# Patient Record
Sex: Male | Born: 1960 | Race: White | Hispanic: No | Smoking: Never smoker
Health system: Southern US, Community
[De-identification: ages and names within clinical notes are randomized; demographics above are authoritative.]

## PROBLEM LIST (undated history)

## (undated) DIAGNOSIS — E119 Type 2 diabetes mellitus without complications: Secondary | ICD-10-CM

## (undated) DIAGNOSIS — E785 Hyperlipidemia, unspecified: Secondary | ICD-10-CM

## (undated) DIAGNOSIS — Z9889 Other specified postprocedural states: Secondary | ICD-10-CM

## (undated) DIAGNOSIS — I2699 Other pulmonary embolism without acute cor pulmonale: Secondary | ICD-10-CM

## (undated) DIAGNOSIS — I10 Essential (primary) hypertension: Secondary | ICD-10-CM

## (undated) DIAGNOSIS — R112 Nausea with vomiting, unspecified: Secondary | ICD-10-CM

## (undated) HISTORY — DX: Hyperlipidemia, unspecified: E78.5

## (undated) HISTORY — PX: BACK SURGERY: SHX140

## (undated) HISTORY — PX: WISDOM TOOTH EXTRACTION: SHX21

## (undated) HISTORY — PX: KNEE SURGERY: SHX244

## (undated) HISTORY — PX: LUNG SURGERY: SHX703

## (undated) HISTORY — PX: CATARACT EXTRACTION: SUR2

## (undated) HISTORY — PX: ANTERIOR CRUCIATE LIGAMENT REPAIR: SHX115

---

## 2000-07-13 ENCOUNTER — Encounter: Payer: Self-pay | Admitting: Internal Medicine

## 2000-07-13 ENCOUNTER — Emergency Department (HOSPITAL_COMMUNITY): Admission: EM | Admit: 2000-07-13 | Discharge: 2000-07-13 | Payer: Self-pay | Admitting: Emergency Medicine

## 2003-08-29 ENCOUNTER — Encounter: Admission: RE | Admit: 2003-08-29 | Discharge: 2003-08-29 | Payer: Self-pay | Admitting: Specialist

## 2003-10-23 ENCOUNTER — Inpatient Hospital Stay (HOSPITAL_COMMUNITY): Admission: RE | Admit: 2003-10-23 | Discharge: 2003-10-25 | Payer: Self-pay | Admitting: Specialist

## 2012-05-27 ENCOUNTER — Encounter (HOSPITAL_COMMUNITY): Payer: Self-pay | Admitting: *Deleted

## 2012-05-27 ENCOUNTER — Emergency Department (HOSPITAL_COMMUNITY): Payer: BC Managed Care – PPO

## 2012-05-27 ENCOUNTER — Observation Stay (HOSPITAL_COMMUNITY)
Admission: EM | Admit: 2012-05-27 | Discharge: 2012-05-30 | Disposition: A | Discharge status: Home / Self Care | Payer: BC Managed Care – PPO | Attending: Internal Medicine | Admitting: Internal Medicine

## 2012-05-27 DIAGNOSIS — R079 Chest pain, unspecified: Principal | ICD-10-CM | POA: Diagnosis present

## 2012-05-27 DIAGNOSIS — I10 Essential (primary) hypertension: Secondary | ICD-10-CM

## 2012-05-27 DIAGNOSIS — M25519 Pain in unspecified shoulder: Secondary | ICD-10-CM | POA: Insufficient documentation

## 2012-05-27 DIAGNOSIS — M25512 Pain in left shoulder: Secondary | ICD-10-CM

## 2012-05-27 DIAGNOSIS — E7800 Pure hypercholesterolemia, unspecified: Secondary | ICD-10-CM | POA: Diagnosis present

## 2012-05-27 DIAGNOSIS — Z86711 Personal history of pulmonary embolism: Secondary | ICD-10-CM

## 2012-05-27 DIAGNOSIS — R0600 Dyspnea, unspecified: Secondary | ICD-10-CM

## 2012-05-27 DIAGNOSIS — E669 Obesity, unspecified: Secondary | ICD-10-CM | POA: Diagnosis present

## 2012-05-27 HISTORY — DX: Other pulmonary embolism without acute cor pulmonale: I26.99

## 2012-05-27 HISTORY — DX: Essential (primary) hypertension: I10

## 2012-05-27 HISTORY — DX: Pain in left shoulder: M25.512

## 2012-05-27 LAB — BASIC METABOLIC PANEL
BUN: 13 mg/dL (ref 6–23)
CO2: 26 mEq/L (ref 19–32)
Calcium: 10 mg/dL (ref 8.4–10.5)
Chloride: 97 mEq/L (ref 96–112)
Creatinine, Ser: 0.91 mg/dL (ref 0.50–1.35)
GFR calc Af Amer: >90 mL/min (ref >90)
GFR calc non Af Amer: >90 mL/min (ref >90)
Glucose, Bld: 109 mg/dL — ABNORMAL HIGH (ref 70–99)
Potassium: 4.1 mEq/L (ref 3.5–5.1)
Sodium: 135 mEq/L (ref 135–145)

## 2012-05-27 LAB — CBC WITH DIFFERENTIAL/PLATELET
Basophils Absolute: 0 10*3/uL (ref 0.0–0.1)
Basophils Relative: 0 % (ref 0–1)
Eosinophils Absolute: 0.2 10*3/uL (ref 0.0–0.7)
Eosinophils Relative: 2 % (ref 0–5)
HCT: 46.8 % (ref 39.0–52.0)
Hemoglobin: 16 g/dL (ref 13.0–17.0)
Lymphocytes Relative: 20 % (ref 12–46)
Lymphs Abs: 2.4 10*3/uL (ref 0.7–4.0)
MCH: 30.6 pg (ref 26.0–34.0)
MCHC: 34.2 g/dL (ref 30.0–36.0)
MCV: 89.5 fL (ref 78.0–100.0)
Monocytes Absolute: 1.2 10*3/uL — ABNORMAL HIGH (ref 0.1–1.0)
Monocytes Relative: 10 % (ref 3–12)
Neutro Abs: 8.3 10*3/uL — ABNORMAL HIGH (ref 1.7–7.7)
Neutrophils Relative %: 68 % (ref 43–77)
Platelets: 288 10*3/uL (ref 150–400)
RBC: 5.23 MIL/uL (ref 4.22–5.81)
RDW: 13.3 % (ref 11.5–15.5)
WBC: 12.1 10*3/uL — ABNORMAL HIGH (ref 4.0–10.5)

## 2012-05-27 LAB — TROPONIN I: Troponin I: <0.3 ng/mL (ref <0.30)

## 2012-05-27 LAB — D-DIMER, QUANTITATIVE (NOT AT ARMC): D-Dimer, Quant: 0.36 ug/mL-FEU (ref 0.00–0.48)

## 2012-05-27 MED ORDER — SODIUM CHLORIDE 0.9 % IJ SOLN
3.0000 mL | INTRAMUSCULAR | Status: DC | PRN
  Administered 2012-05-28: 3 mL via INTRAVENOUS

## 2012-05-27 MED ORDER — MORPHINE SULFATE 4 MG/ML IJ SOLN
6.0000 mg | Freq: Once | INTRAMUSCULAR | Status: AC
  Administered 2012-05-27: 6 mg via INTRAVENOUS
  Filled 2012-05-27 (×2): qty 1

## 2012-05-27 MED ORDER — BENAZEPRIL HCL 10 MG PO TABS
20.0000 mg | ORAL_TABLET | Freq: Every day | ORAL | Status: DC
  Administered 2012-05-28 – 2012-05-30 (×3): 20 mg via ORAL
  Filled 2012-05-27 (×3): qty 2

## 2012-05-27 MED ORDER — ASPIRIN 81 MG PO CHEW
CHEWABLE_TABLET | ORAL | Status: AC
  Administered 2012-05-27: 324 mg via ORAL
  Filled 2012-05-27: qty 4

## 2012-05-27 MED ORDER — SODIUM CHLORIDE 0.9 % IJ SOLN
3.0000 mL | Freq: Two times a day (BID) | INTRAMUSCULAR | Status: DC
  Administered 2012-05-29: 3 mL via INTRAVENOUS
  Filled 2012-05-27: qty 3

## 2012-05-27 MED ORDER — ACETAMINOPHEN 650 MG RE SUPP: 650.0000 mg | Freq: Four times a day (QID) | RECTAL | Status: DC | PRN

## 2012-05-27 MED ORDER — SODIUM CHLORIDE 0.9 % IV SOLN: 250.0000 mL | INTRAVENOUS | Status: DC | PRN

## 2012-05-27 MED ORDER — MORPHINE SULFATE 2 MG/ML IJ SOLN
2.0000 mg | INTRAMUSCULAR | Status: DC | PRN
  Administered 2012-05-28 – 2012-05-29 (×2): 2 mg via INTRAVENOUS
  Filled 2012-05-27 (×2): qty 1

## 2012-05-27 MED ORDER — ASPIRIN 81 MG PO CHEW: 324.0000 mg | CHEWABLE_TABLET | Freq: Once | ORAL | Status: AC

## 2012-05-27 MED ORDER — ALBUTEROL SULFATE (5 MG/ML) 0.5% IN NEBU: 2.5000 mg | INHALATION_SOLUTION | RESPIRATORY_TRACT | Status: DC | PRN

## 2012-05-27 MED ORDER — ASPIRIN 81 MG PO CHEW: 243.0000 mg | CHEWABLE_TABLET | Freq: Once | ORAL | Status: DC

## 2012-05-27 MED ORDER — ONDANSETRON HCL 4 MG/2ML IJ SOLN: 4.0000 mg | Freq: Four times a day (QID) | INTRAMUSCULAR | Status: DC | PRN

## 2012-05-27 MED ORDER — ACETAMINOPHEN 325 MG PO TABS
650.0000 mg | ORAL_TABLET | Freq: Four times a day (QID) | ORAL | Status: DC | PRN
  Administered 2012-05-29: 650 mg via ORAL
  Filled 2012-05-27: qty 2

## 2012-05-27 MED ORDER — ENOXAPARIN SODIUM 40 MG/0.4ML ~~LOC~~ SOLN
40.0000 mg | SUBCUTANEOUS | Status: DC
  Administered 2012-05-27 – 2012-05-29 (×3): 40 mg via SUBCUTANEOUS
  Filled 2012-05-27 (×3): qty 0.4

## 2012-05-27 MED ORDER — AMLODIPINE BESYLATE 5 MG PO TABS
10.0000 mg | ORAL_TABLET | Freq: Every day | ORAL | Status: DC
  Administered 2012-05-28 – 2012-05-29 (×2): 10 mg via ORAL
  Filled 2012-05-27 (×2): qty 2

## 2012-05-27 MED ORDER — SODIUM CHLORIDE 0.9 % IJ SOLN
3.0000 mL | Freq: Two times a day (BID) | INTRAMUSCULAR | Status: DC
  Administered 2012-05-27 – 2012-05-29 (×4): 3 mL via INTRAVENOUS

## 2012-05-27 MED ORDER — NITROGLYCERIN 2 % TD OINT: 1.0000 [in_us] | TOPICAL_OINTMENT | Freq: Once | TRANSDERMAL | Status: AC

## 2012-05-27 MED ORDER — ASPIRIN EC 81 MG PO TBEC
81.0000 mg | DELAYED_RELEASE_TABLET | Freq: Every day | ORAL | Status: DC
  Administered 2012-05-28 – 2012-05-30 (×3): 81 mg via ORAL
  Filled 2012-05-27 (×3): qty 1

## 2012-05-27 MED ORDER — DILTIAZEM HCL ER COATED BEADS 240 MG PO CP24
240.0000 mg | ORAL_CAPSULE | Freq: Every day | ORAL | Status: DC
  Administered 2012-05-28 – 2012-05-29 (×2): 240 mg via ORAL
  Filled 2012-05-27 (×2): qty 1

## 2012-05-27 MED ORDER — AMLODIPINE BESY-BENAZEPRIL HCL 10-20 MG PO CAPS: 1.0000 | ORAL_CAPSULE | Freq: Every day | ORAL | Status: DC

## 2012-05-27 MED ORDER — ONDANSETRON HCL 4 MG PO TABS
4.0000 mg | ORAL_TABLET | Freq: Four times a day (QID) | ORAL | Status: DC | PRN
  Administered 2012-05-29 (×2): 4 mg via ORAL
  Filled 2012-05-27 (×2): qty 1

## 2012-05-27 MED ORDER — NITROGLYCERIN 2 % TD OINT
TOPICAL_OINTMENT | TRANSDERMAL | Status: AC
  Administered 2012-05-27: 1 [in_us] via TOPICAL
  Filled 2012-05-27: qty 1

## 2012-05-27 MED ORDER — ATORVASTATIN CALCIUM 20 MG PO TABS
20.0000 mg | ORAL_TABLET | Freq: Every day | ORAL | Status: DC
  Administered 2012-05-28 – 2012-05-30 (×3): 20 mg via ORAL
  Filled 2012-05-27 (×4): qty 1

## 2012-05-27 NOTE — H&P (Signed)
[facility] History and Physical  Brian Roach:[id number] DOB: 1960-06-25 DOA: 05/27/2012   PCP: Provider at [facility] Specialists: None  Chief Complaint: Left-sided shoulder pain and chest pain since about 1 PM  HPI: Brian Roach is a 52 y.o. male who has a past medical history of hypertension, hypercholesterolemia, obesity who was in his usual state of health till about 1:00 this afternoon when he returned home after meeting his daughter. About 20 minutes after he got home he started having pain in the left shoulder area, radiating to the arm to the neck and head. Was described as a sharp pain, which was 10 out of 10 in intensity. He also noticed some tightness in the left side of his chest. Denies any physical exertion in the last day or 2. Denies lifting any heavy weights. Movement did not change the pain. He had minimal shortness of breath and noted that whenever he would take a deep breath the tightness in the left chest increased. He felt a little nauseous, but did not have any vomiting. Felt a little hot and sweaty. Denies any dizziness. Denies palpitations. No leg swelling. He's never had similar symptoms in the past. Denies any history of heart disease. He tells me that he had a pulmonary embolism about 10 years ago, after having knee surgery. He required surgical intervention, and chest tubes for that issue at that time. He's had stress test many years ago. Does not recall when it was. He has never had a cardiac catheterization. In the emergency department he received 4 aspirins and nitroglycerin paste. Pain recurred about half an hour prior to my assessment, and he was given morphine, with which the pain is now down to 4/10. He is anxious.  Home Medications: Prior to Admission medications   Medication Sig Start Date End Date Taking? Authorizing Provider  amLODipine-benazepril (LOTREL) 10-20 MG per capsule Take 1 capsule by mouth daily.   Yes  Historical Provider, MD  atorvastatin (LIPITOR) 20 MG tablet Take 20 mg by mouth daily.   Yes Historical Provider, MD  diltiazem (TIAZAC) 240 MG 24 hr capsule Take 240 mg by mouth daily.   Yes Historical Provider, MD  meloxicam (MOBIC) 15 MG tablet Take 15 mg by mouth daily.   Yes Historical Provider, MD    Allergies: No Known Allergies  Past Medical History: Past Medical History  Diagnosis Date  . PE (pulmonary embolism)   . Hypertension     Past Surgical History  Procedure Laterality Date  . Back surgery    . Knee surgery    . Lung surgery      Unspecified but was done for PE. Had chest tubes. Approx 2003    Social History:  reports that he has never smoked. He does not have any smokeless tobacco history on file. He reports that he does not drink alcohol or use illicit drugs.  Living Situation: He lives with his family in [place] Activity Level: Usually independent with daily activities   Family History:  Family History  Problem Relation Age of Onset  . Heart disease Father    he reports history of heart disease in his brother  Review of Systems - History obtained from the patient General ROS: negative Psychological ROS: positive for - anxiety Ophthalmic ROS: negative ENT ROS: negative Allergy and Immunology ROS: negative Hematological and Lymphatic ROS: negative Endocrine ROS: negative Respiratory ROS: as in hpi Cardiovascular ROS: as in hpi Gastrointestinal ROS: no abdominal pain, change in  bowel habits, or black or bloody stools Genito-Urinary ROS: no dysuria, trouble voiding, or hematuria Musculoskeletal ROS: as in hpi Neurological ROS: no TIA or stroke symptoms Dermatological ROS: negative  Physical Examination  Filed Vitals:   05/27/12 1546 05/27/12 1742  BP: 134/66 141/84  Pulse: 116 89  Temp: 98.7 F (37.1 C)   Resp: 17 24  Height: 5\' 10"  (1.778 m)   Weight: 127.007 kg (280 lb)   SpO2: 99% 97%    General appearance: alert, cooperative, appears  stated age, no distress and moderately obese Head: Normocephalic, without obvious abnormality, atraumatic Eyes: conjunctivae/corneas clear. PERRL, EOM's intact.  Throat: lips, mucosa, and tongue normal; teeth and gums normal Neck: no adenopathy, no carotid bruit, no JVD, supple, symmetrical, trachea midline and thyroid not enlarged, symmetric, no tenderness/mass/nodules Back: symmetric, no curvature. ROM normal. No CVA tenderness. Resp: clear to auscultation bilaterally Chest wall: no tenderness Cardio: regular rate and rhythm, S1, S2 normal, no murmur, click, rub or gallop GI: soft, non-tender; bowel sounds normal; no masses,  no organomegaly Extremities: And off. The left arm and the shoulder did not produce the pain. There was no limitation of movement. Pulses: 2+ and symmetric Skin: Skin color, texture, turgor normal. No rashes or lesions Lymph nodes: Cervical, supraclavicular, and axillary nodes normal. Neurologic: He is alert and oriented x3. No focal neurological deficits are present.  Laboratory Data: Results for orders placed during the hospital encounter of 05/27/12 (from the past 48 hour(s))  TROPONIN I     Status: None   Collection Time    05/27/12  4:18 PM      Result Value Range   Troponin I <0.30  <0.30 ng/mL  CBC WITH DIFFERENTIAL     Status: Abnormal   Collection Time    05/27/12  4:18 PM      Result Value Range   WBC 12.1 (*) 4.0 - 10.5 K/uL   RBC 5.23  4.22 - 5.81 MIL/uL   Hemoglobin 16.0  13.0 - 17.0 g/dL   HCT 16.1  09.6 - 04.5 %   MCV 89.5  78.0 - 100.0 fL   MCH 30.6  26.0 - 34.0 pg   MCHC 34.2  30.0 - 36.0 g/dL   RDW 40.9  81.1 - 91.4 %   Platelets 288  150 - 400 K/uL   Neutrophils Relative 68  43 - 77 %   Lymphocytes Relative 20  12 - 46 %   Monocytes Relative 10  3 - 12 %   Eosinophils Relative 2  0 - 5 %   Basophils Relative 0  0 - 1 %   Neutro Abs 8.3 (*) 1.7 - 7.7 K/uL   Lymphs Abs 2.4  0.7 - 4.0 K/uL   Monocytes Absolute 1.2 (*) 0.1 - 1.0 K/uL    Eosinophils Absolute 0.2  0.0 - 0.7 K/uL   Basophils Absolute 0.0  0.0 - 0.1 K/uL   WBC Morphology WHITE COUNT CONFIRMED ON SMEAR    BASIC METABOLIC PANEL     Status: Abnormal   Collection Time    05/27/12  4:18 PM      Result Value Range   Sodium 135  135 - 145 mEq/L   Potassium 4.1  3.5 - 5.1 mEq/L   Chloride 97  96 - 112 mEq/L   CO2 26  19 - 32 mEq/L   Glucose, Bld 109 (*) 70 - 99 mg/dL   BUN 13  6 - 23 mg/dL   Creatinine, Ser 7.82  0.50 - 1.35 mg/dL   Calcium 47.8  8.4 - 29.5 mg/dL   GFR calc non Af Amer >90  >90 mL/min   GFR calc Af Amer >90  >90 mL/min   Comment:            The eGFR has been calculated     using the CKD EPI equation.     This calculation has not been     validated in all clinical     situations.     eGFR's persistently     <90 mL/min signify     possible Chronic Kidney Disease.  D-DIMER, QUANTITATIVE     Status: None   Collection Time    05/27/12  4:45 PM      Result Value Range   D-Dimer, Quant 0.36  0.00 - 0.48 ug/mL-FEU   Comment:            AT THE INHOUSE ESTABLISHED CUTOFF     VALUE OF 0.48 ug/mL FEU,     THIS ASSAY HAS BEEN DOCUMENTED     IN THE LITERATURE TO HAVE     A SENSITIVITY AND NEGATIVE     PREDICTIVE VALUE OF AT LEAST     98 TO 99%.  THE TEST RESULT     SHOULD BE CORRELATED WITH     AN ASSESSMENT OF THE CLINICAL     PROBABILITY OF DVT / VTE.    Radiology Reports: Dg Chest 2 View  05/27/2012  *RADIOLOGY REPORT*  Clinical Data: Shoulder pain and neck pain.  CHEST - 2 VIEW  Comparison: 10/16/2003  Findings: Two views of the chest demonstrate stable elevation of the right hemidiaphragm and chronic densities along the right lateral ribs.  Otherwise, the lungs are clear.  Heart and mediastinum are stable.  Trachea is midline.  No evidence for pleural effusions.  IMPRESSION: No acute chest findings.   Original Report Authenticated By: Richarda Overlie, M.D.     Electrocardiogram: EKG done at 3:56 PM shows sinus rhythm at 93 beats per minute.  Normal axis. Intervals are normal. No Q waves. There were some suspicious ST changes (minimal ST elevation) noted in V1 and V2. No reciprocal changes. EKG was repeated at 5:40 PM and continue to show similar changes. Both of these EKGs were compared to one from 2005, and the changes seen to be present at that time as well.  Problem List  Principal Problem:   Chest pain at rest Active Problems:   Left shoulder pain   History of pulmonary embolism   Obesity   HTN (hypertension), benign   Hypercholesteremia   Assessment: This is a 52 year old, Caucasian male, who presents with the left shoulder, arm, neck, and chest pain. Symptoms are atypical for heart disease. However, he does have nonspecific EKG changes and plus he has multiple risk factors for having coronary artery disease. Multiple EKGs show similar findings without dynamic changes which is reassuring. Differential diagnoses include coronary artery disease, venous thrombo embolism, musculoskeletal etiology, GI, etiology.  Plan: #1 left shoulder pain, and chest pain: Examination does not suggest musculoskeletal etiology. D-dimer is normal. We will repeat another troponin and if that is normal he will be admitted to this hospital and serial cardiac enzymes will be cycled. Aspirin will be continued. Telemetry shows PACs. TSH level be checked. Echocardiogram will be ordered. He will require a definitive evaluation for heart disease in the form of either stress test or cardiac catheterization. Cardiology can be consulted either as inpatient or outpatient  depending on his symptoms and his troponin levels.  #2 history of hypertension: Continue with his antihypertensive regimen.  #3 history of hypercholesterolemia: Continue with his medications. Lipid panel will be checked.  #4 obesity: He will require weight loss counseling at some point.  #5 history of PE approximately 10 years ago: This was following a knee surgery. With a normal d-dimer this  is very less likely at this time.   DVT Prophylaxis: Enoxaparin  Code Status: Full code  Family Communication: Discussed with the patient and his brother, who was at the, bedside   Disposition Plan: Will likely return home when improved    Further management decisions will depend on results of further testing and patient's response to treatment.  [facility]  [facility] Pager [phone]  If 7PM-7AM, please contact night-coverage [email or web address] Password [facility]  05/27/2012, 7:52 PM

## 2012-05-27 NOTE — ED Notes (Addendum)
Dr. Kerry Hough called and reported that he has discussed pt case with Dr. Juleen China. Dr. Kerry Hough gave verbal order to hold pt in ED until Dr. Earney Mallet can assess pt. Pt/Pt family aware.

## 2012-05-27 NOTE — ED Notes (Signed)
Was informed dr Rito Ehrlich must assess pt in ED prior to pt being admitted upstairs. Will call report after pt has been assessed by hospitalist and is cleared for admission upstairs.

## 2012-05-27 NOTE — ED Notes (Signed)
Pt states pain to left shoulder and pain to jaw, states pain has crept back after being given nitro paste but that after the morphine pain is now a 4/10. Pain also radiated to left jaw and around back of left side of neck.

## 2012-05-27 NOTE — ED Provider Notes (Addendum)
History  This chart was scribed for Brian Razor, MD by Shari Heritage, ED Scribe. The patient was seen in room APA04/APA04. Patient's care was started at 1554.   CSN: [id number]  Arrival date & time 05/27/12  1511   First MD Initiated Contact with Patient 05/27/12 1554      Chief Complaint  Patient presents with  . Shoulder Pain    Patient is a 52 y.o. male presenting with shoulder pain. The history is provided by the patient. No language interpreter was used.  Shoulder Pain This is a new problem. The current episode started 3 to 5 hours ago. The problem occurs constantly. The problem has not changed since onset.Associated symptoms include shortness of breath. Nothing aggravates the symptoms. Nothing relieves the symptoms. He has tried nothing for the symptoms.     HPI Comments: Brian Roach is a 52 y.o. male who presents to the Emergency Department complaining of sudden, moderate, left shoulder pain that radiates up left neck to jaw onset 2-3 hours ago. Patient states that he was not doing any exertional activities at time of onset. Pain is unchanged with movement of the left arm. He states that he went home after pain began and started to have dyspnea on exertion, skin flushing, diaphoresis and nausea. He also says that he felt "tight" in his left side with deep breaths. He states that pain is still present now, but nausea has improved. He states that he was mildly short of breath as he walked into the ED, but he is not experiencing shortness of breath right now. He denies back pain or leg swelling. Patient has a medical history of PE, hypertension and cholesterol. He takes aspirin 81 mg daily. He says that he took aspirin, BP and cholesterol medicines this morning.   PCP - Bennie Pierini, [facility]   Past Medical History  Diagnosis Date  . PE (pulmonary embolism)   . Hypertension     Past Surgical History  Procedure Laterality Date  . Back  surgery    . Knee surgery      No family history on file.  History  Substance Use Topics  . Smoking status: Never Smoker   . Smokeless tobacco: Not on file  . Alcohol Use: No      Review of Systems  Constitutional: Positive for diaphoresis.  Respiratory: Positive for shortness of breath.   Cardiovascular: Negative for leg swelling.  Gastrointestinal: Positive for nausea.  Musculoskeletal: Negative for back pain.  All other systems reviewed and are negative.    Allergies  Review of patient's allergies indicates no known allergies.  Home Medications  No current outpatient prescriptions on file.  Triage Vitals: BP 134/66  Pulse 116  Temp(Src) 98.7 F (37.1 C)  Resp 17  Ht 5\' 10"  (1.778 m)  Wt 280 lb (127.007 kg)  BMI 40.18 kg/m2  SpO2 99%  Physical Exam  Nursing note and vitals reviewed. Constitutional: He appears well-developed and well-nourished. No distress.  Obese.  HENT:  Head: Normocephalic and atraumatic.  Eyes: Conjunctivae are normal. Right eye exhibits no discharge. Left eye exhibits no discharge.  Neck: Neck supple.  Cardiovascular: Regular rhythm and normal heart sounds.  Tachycardia present.  Exam reveals no gallop and no friction rub.   No murmur heard. Mildly tachycardic.  Pulmonary/Chest: Effort normal and breath sounds normal. No respiratory distress.  Abdominal: Soft. He exhibits no distension. There is no tenderness.  Musculoskeletal: He exhibits no edema and no tenderness.  No lower extremity edema. No calf tenderness.  Neurological: He is alert.  Skin: Skin is warm and dry.  Psychiatric: He has a normal mood and affect. His behavior is normal. Thought content normal.    ED Course  Procedures (including critical care time) DIAGNOSTIC STUDIES: Oxygen Saturation is 99% on room air, normal by my interpretation.    COORDINATION OF CARE: 4:09 PM- Patient informed of current plan for treatment and evaluation and agrees with plan at this  time.   EKG:  Rhythm: normal sinus with PACs Rate: 93 Intervals: normal  Axis: normal ST segments: Mild STE in v1/2. No reciprocal changes.  5:48 PM: Patient denies significant improvement of pain after aspirin 324 mg and nitroglycerin. Patient's symptoms are concerning and am recommending hospitalization. Patient agrees with plan at this time. Will page hospitalist to arrange admission.   6:26 PM- Spoke with Dr. Karilyn Cota who has agreed to admit patient for further evaluation.    Labs Reviewed  CBC WITH DIFFERENTIAL - Abnormal; Notable for the following:    WBC 12.1 (*)    Neutro Abs 8.3 (*)    Monocytes Absolute 1.2 (*)    All other components within normal limits  BASIC METABOLIC PANEL - Abnormal; Notable for the following:    Glucose, Bld 109 (*)    All other components within normal limits  TROPONIN I  D-DIMER, QUANTITATIVE    Dg Chest 2 View  05/27/2012  *RADIOLOGY REPORT*  Clinical Data: Shoulder pain and neck pain.  CHEST - 2 VIEW  Comparison: 10/16/2003  Findings: Two views of the chest demonstrate stable elevation of the right hemidiaphragm and chronic densities along the right lateral ribs.  Otherwise, the lungs are clear.  Heart and mediastinum are stable.  Trachea is midline.  No evidence for pleural effusions.  IMPRESSION: No acute chest findings.   Original Report Authenticated By: Richarda Overlie, M.D.    EKG:  Rhythm: normal sinus with PACs Vent. rate 93 BPM PR interval 174 ms QRS duration 86 ms QT/QTc 338/420 ms ST segments: mild STE in v1/2 Comparison: little interval change from ekg form 2005   1. Shoulder pain, acute, left   2. Dyspnea   3. Chest pain at rest   4. HTN (hypertension), benign   5. Hypercholesteremia       MDM  51yM with L shoulder pain. EKG with non-diagnostic STE in v1/v2 w/o reciprocal changes. Noted to lesser degree on EKG from 09/2003. Story concerning. No known CAD but risk factors.      I personally preformed the services  scribed in my presence. The recorded information has been reviewed is accurate. Brian Razor, MD.    Brian Razor, MD 05/28/12 1506  Brian Razor, MD 05/28/12 [phone]

## 2012-05-27 NOTE — ED Notes (Signed)
Pt c/o left shoulder pain that has remained constant that started today, pain radiates to neck, jaw, chest area. Unsure of any associated symptoms, pt states that he feels "tight" on left side and had one episode of nausea prior to leaving his house.

## 2012-05-27 NOTE — ED Notes (Signed)
Dr Rito Ehrlich in to see pt, states after second troponin is neg pt may go up.

## 2012-05-28 DIAGNOSIS — R0989 Other specified symptoms and signs involving the circulatory and respiratory systems: Secondary | ICD-10-CM

## 2012-05-28 LAB — COMPREHENSIVE METABOLIC PANEL
Alkaline Phosphatase: 80 U/L (ref 39–117)
BUN: 13 mg/dL (ref 6–23)
Chloride: 99 mEq/L (ref 96–112)
Creatinine, Ser: 0.88 mg/dL (ref 0.50–1.35)
GFR calc Af Amer: >90 mL/min (ref >90)
GFR calc non Af Amer: >90 mL/min (ref >90)
Glucose, Bld: 107 mg/dL — ABNORMAL HIGH (ref 70–99)
Potassium: 3.7 mEq/L (ref 3.5–5.1)
Total Bilirubin: 0.7 mg/dL (ref 0.3–1.2)

## 2012-05-28 LAB — CBC
Platelets: 275 10*3/uL (ref 150–400)
RBC: 4.99 MIL/uL (ref 4.22–5.81)
RDW: 13.4 % (ref 11.5–15.5)
WBC: 9.7 10*3/uL (ref 4.0–10.5)

## 2012-05-28 LAB — TROPONIN I: Troponin I: <0.3 ng/mL (ref <0.30)

## 2012-05-28 LAB — PROTIME-INR
INR: 1.09 (ref 0.00–1.49)
Prothrombin Time: 14 seconds (ref 11.6–15.2)

## 2012-05-28 MED ORDER — BIOTENE DRY MOUTH MT LIQD
15.0000 mL | Freq: Two times a day (BID) | OROMUCOSAL | Status: DC
  Administered 2012-05-28 – 2012-05-30 (×4): 15 mL via OROMUCOSAL

## 2012-05-28 NOTE — Progress Notes (Signed)
05/28/12 1823 Patient ambulated hallway this evening independently, tolerated well. No c/o pain or shortness of breath during ambulation. Earnstine Regal, RN

## 2012-05-28 NOTE — Progress Notes (Signed)
[facility] PROGRESS NOTE  Brian Roach ZOX:[id number] DOB: 07/31/1960 DOA: 05/27/2012 PCP: Bennie Pierini, FNP  Assessment/Plan: 1. Left shoulder pain/chest discomfort. Patient is ruled out for ACS with negative cardiac markers. He did have some questionable changes on his initial EKG. He continues to have intermittent symptoms. 2-D echocardiogram will be done tomorrow. We will request a cardiology consultation regarding any further workup if necessary. Continue aspirin. 2. Hypertension. Controlled. 3. Hypercholesterolemia. Continue Lipitor. 4. History of PE approximately 10 years ago. D-dimer is normal, unlikely this is PE. 5. Obesity  Code Status: full code Family Communication: discussed with patient Disposition Plan: discharge home when improved   Consultants:  none  Procedures:  none  Antibiotics:  none  HPI/Subjective: Recurrent left shoulder pain radiating to jaw with associated chest tightness overnight. Patient feels somewhat short of breath on ambulation. This is a new symptom.  Objective: Filed Vitals:   05/27/12 2211 05/28/12 0545 05/28/12 0723 05/28/12 1343  BP:  134/83  104/58  Pulse: 78 82  83  Temp:  98.2 F (36.8 C)  98.5 F (36.9 C)  TempSrc:      Resp: 16 20  18   Height:      Weight:      SpO2: 91% 98% 98% 99%    Intake/Output Summary (Last 24 hours) at 05/28/12 1521 Last data filed at 05/28/12 1200  Gross per 24 hour  Intake    723 ml  Output      0 ml  Net    723 ml   Filed Weights   05/27/12 1546 05/27/12 2059  Weight: 127.007 kg (280 lb) 132.949 kg (293 lb 1.6 oz)    Exam:   General:  No acute distress  Cardiovascular: S1, S2, regular rate and rhythm  Respiratory: Clear to auscultation bilaterally  Abdomen: Soft, nontender, nondistended, bowel sounds are active  Musculoskeletal: No pain on palpation the left shoulder, full range of movement without any discomfort.   Data Reviewed: Basic Metabolic  Panel:  Recent Labs Lab 05/27/12 1618 05/28/12 0423  NA 135 136  K 4.1 3.7  CL 97 99  CO2 26 27  GLUCOSE 109* 107*  BUN 13 13  CREATININE 0.91 0.88  CALCIUM 10.0 9.0   Liver Function Tests:  Recent Labs Lab 05/28/12 0423  AST 26  ALT 44  ALKPHOS 80  BILITOT 0.7  PROT 7.0  ALBUMIN 4.0   No results found for this basename: LIPASE, AMYLASE,  in the last 168 hours No results found for this basename: AMMONIA,  in the last 168 hours CBC:  Recent Labs Lab 05/27/12 1618 05/28/12 0423  WBC 12.1* 9.7  NEUTROABS 8.3*  --   HGB 16.0 15.4  HCT 46.8 44.6  MCV 89.5 89.4  PLT 288 275   Cardiac Enzymes:  Recent Labs Lab 05/27/12 1618 05/27/12 1934 05/28/12 0423 05/28/12 1023  TROPONINI <0.30 <0.30 <0.30 <0.30   BNP (last 3 results) No results found for this basename: PROBNP,  in the last 8760 hours CBG: No results found for this basename: GLUCAP,  in the last 168 hours  No results found for this or any previous visit (from the past 240 hour(s)).   Studies: Dg Chest 2 View  05/27/2012  *RADIOLOGY REPORT*  Clinical Data: Shoulder pain and neck pain.  CHEST - 2 VIEW  Comparison: 10/16/2003  Findings: Two views of the chest demonstrate stable elevation of the right hemidiaphragm and chronic densities along the right lateral ribs.  Otherwise, the lungs  are clear.  Heart and mediastinum are stable.  Trachea is midline.  No evidence for pleural effusions.  IMPRESSION: No acute chest findings.   Original Report Authenticated By: Richarda Overlie, M.D.     Scheduled Meds: . benazepril  20 mg Oral Daily   And  . amLODipine  10 mg Oral Daily  . aspirin EC  81 mg Oral Daily  . atorvastatin  20 mg Oral Daily  . diltiazem  240 mg Oral Daily  . enoxaparin (LOVENOX) injection  40 mg Subcutaneous Q24H  . sodium chloride  3 mL Intravenous Q12H  . sodium chloride  3 mL Intravenous Q12H   Continuous Infusions:   Principal Problem:   Chest pain at rest Active Problems:   Left  shoulder pain   History of pulmonary embolism   Obesity   HTN (hypertension), benign   Hypercholesteremia    Time spent:    [facility]  [facility] Pager [phone]. If 7PM-7AM, please contact night-coverage at [email or web address], password [facility] 05/28/2012, 3:21 PM  LOS: 1 day

## 2012-05-29 DIAGNOSIS — I517 Cardiomegaly: Secondary | ICD-10-CM

## 2012-05-29 LAB — LIPID PANEL
Cholesterol: 129 mg/dL (ref 0–200)
LDL Cholesterol: 67 mg/dL (ref 0–99)
Total CHOL/HDL Ratio: 3.3 RATIO
VLDL: 23 mg/dL (ref 0–40)

## 2012-05-29 MED ORDER — DILTIAZEM HCL ER COATED BEADS 180 MG PO CP24
300.0000 mg | ORAL_CAPSULE | Freq: Every day | ORAL | Status: DC
  Administered 2012-05-30: 300 mg via ORAL
  Filled 2012-05-29: qty 1

## 2012-05-29 MED ORDER — NITROGLYCERIN 0.4 MG SL SUBL
SUBLINGUAL_TABLET | SUBLINGUAL | Status: AC
  Filled 2012-05-29: qty 25

## 2012-05-29 MED ORDER — REGADENOSON 0.4 MG/5ML IV SOLN
0.4000 mg | Freq: Once | INTRAVENOUS | Status: DC
  Filled 2012-05-29: qty 5

## 2012-05-29 NOTE — Progress Notes (Signed)
UR Chart Review Completed  

## 2012-05-29 NOTE — Progress Notes (Signed)
[facility] PROGRESS NOTE  SHEMAR PLEMMONS ZOX:[id number] DOB: 05-Jun-1960 DOA: 05/27/2012 PCP: Bennie Pierini, FNP  Assessment/Plan: 1. Left shoulder pain/chest discomfort. Patient is ruled out for ACS with negative cardiac markers. He did have some questionable changes on his initial EKG. He continues to have intermittent symptoms. 2-D echocardiogram results pending. Seen by cardiology who opines that given risk factors will arrange for [facility] 05/30/12. Continue aspirin. 2. Hypertension. Controlled. SBP range 104-134. Some changes made to medications per cardiology. Will monitor.  3. Hypercholesterolemia. Continue Lipitor. Lipid panel in process. 4. History of PE approximately 10 years ago. D-dimer is normal, unlikely this is PE. 5. Obesity: BMI 42.1. Nutritional consult.    Code Status: full Family Communication:  Disposition Plan: hopefully home tomorrow after stress test.    Consultants:  Dr. Diona Browner cardiology>>>  Procedures:  none  Antibiotics:  none  HPI/Subjective: Awake alert NAD  Objective: Filed Vitals:   05/28/12 0723 05/28/12 1343 05/28/12 2120 05/29/12 0551  BP:  104/58 111/74 126/77  Pulse:  83 76 67  Temp:  98.5 F (36.9 C) 97.7 F (36.5 C) 97.5 F (36.4 C)  TempSrc:   Oral   Resp:  18 16 16   Height:      Weight:      SpO2: 98% 99% 93% 97%    Intake/Output Summary (Last 24 hours) at 05/29/12 1102 Last data filed at 05/28/12 1700  Gross per 24 hour  Intake    480 ml  Output      0 ml  Net    480 ml   Filed Weights   05/27/12 1546 05/27/12 2059  Weight: 127.007 kg (280 lb) 132.949 kg (293 lb 1.6 oz)    Exam:   General: awake alert NAD   Cardiovascular: RRR no MGR No LE edema PPP bilaterally  Respiratory: normal effort BS clear bilaterally no wheeze/no rhonchi  Abdomen: soft +BS non-tender to palpation  Musculoskeletal: moves all extremities left arm/shldr mild tenderness to touch.    Data Reviewed: Basic  Metabolic Panel:  Recent Labs Lab 05/27/12 1618 05/28/12 0423  NA 135 136  K 4.1 3.7  CL 97 99  CO2 26 27  GLUCOSE 109* 107*  BUN 13 13  CREATININE 0.91 0.88  CALCIUM 10.0 9.0   Liver Function Tests:  Recent Labs Lab 05/28/12 0423  AST 26  ALT 44  ALKPHOS 80  BILITOT 0.7  PROT 7.0  ALBUMIN 4.0   No results found for this basename: LIPASE, AMYLASE,  in the last 168 hours No results found for this basename: AMMONIA,  in the last 168 hours CBC:  Recent Labs Lab 05/27/12 1618 05/28/12 0423  WBC 12.1* 9.7  NEUTROABS 8.3*  --   HGB 16.0 15.4  HCT 46.8 44.6  MCV 89.5 89.4  PLT 288 275   Cardiac Enzymes:  Recent Labs Lab 05/27/12 1618 05/27/12 1934 05/28/12 0423 05/28/12 1023  TROPONINI <0.30 <0.30 <0.30 <0.30   BNP (last 3 results) No results found for this basename: PROBNP,  in the last 8760 hours CBG: No results found for this basename: GLUCAP,  in the last 168 hours  No results found for this or any previous visit (from the past 240 hour(s)).   Studies: Dg Chest 2 View  05/27/2012  *RADIOLOGY REPORT*  Clinical Data: Shoulder pain and neck pain.  CHEST - 2 VIEW  Comparison: 10/16/2003  Findings: Two views of the chest demonstrate stable elevation of the right hemidiaphragm and chronic densities along the  right lateral ribs.  Otherwise, the lungs are clear.  Heart and mediastinum are stable.  Trachea is midline.  No evidence for pleural effusions.  IMPRESSION: No acute chest findings.   Original Report Authenticated By: Richarda Overlie, M.D.     Scheduled Meds: . antiseptic oral rinse  15 mL Mouth Rinse BID  . aspirin EC  81 mg Oral Daily  . atorvastatin  20 mg Oral Daily  . benazepril  20 mg Oral Daily  . [START ON 05/30/2012] diltiazem  300 mg Oral Daily  . enoxaparin (LOVENOX) injection  40 mg Subcutaneous Q24H  . [START ON 05/30/2012] regadenoson  0.4 mg Intravenous Once  . sodium chloride  3 mL Intravenous Q12H  . sodium chloride  3 mL Intravenous Q12H    Continuous Infusions:   Principal Problem:   Chest pain at rest Active Problems:   Left shoulder pain   History of pulmonary embolism   Obesity   HTN (hypertension), benign   Hypercholesteremia    Time spent: 30 minutes    [facility] M  [facility]  If 7PM-7AM, please contact night-coverage at [email or web address], password [facility] 05/29/2012, 11:02 AM  LOS: 2 days    Attending note:  Patient independently seen and examined. Agree with not as above. Plans for stress test tomorrow to further evaluate chest discomfort.

## 2012-05-29 NOTE — Progress Notes (Signed)
*  PRELIMINARY RESULTS* Echocardiogram 2D Echocardiogram has been performed.  Brian Roach 05/29/2012, 4:05 PM

## 2012-05-29 NOTE — Progress Notes (Signed)
Patient complained of chest pain at shift change. Doctor on call was notified and stat EKG completed. Was given instructions from the MD to give PRN morphine. Will continue to monitor patient. Delma Freeze 05/29/2012

## 2012-05-29 NOTE — Care Management Note (Signed)
    Page 1 of 1   05/30/2012     1:35:57 PM   CARE MANAGEMENT NOTE 05/30/2012  Patient:  Brian Roach, Brian Roach   Account Number:  [id number]  Date Initiated:  05/29/2012  Documentation initiated by:  Rosemary Holms  Subjective/Objective Assessment:   Pt admitted from home where he lives with his spouse. Pt to have inpt test. Plan to DC home with no anticipaed HH needs.     Action/Plan:   Anticipated DC Date:  05/30/2012   Anticipated DC Plan:  HOME/SELF CARE      DC Planning Services  CM consult      Choice offered to / List presented to:             Status of service:  Completed, signed off Medicare Important Message given?   (If response is "NO", the following Medicare IM given date fields will be blank) Date Medicare IM given:   Date Additional Medicare IM given:    Discharge Disposition:  HOME/SELF CARE  Per UR Regulation:    If discussed at Long Length of Stay Meetings, dates discussed:    Comments:  05/29/12 Rosemary Holms RN BSN CM

## 2012-05-29 NOTE — Consult Note (Signed)
CARDIOLOGY CONSULT NOTE     Patient ID: Brian Roach MRN: [id number] DOB/AGE: Jun 18, 1960 52 y.o.  Admit date: 05/27/2012 Referring Physician: PTH-Memon MD Primary Physician Bennie Pierini, FNP Consulting Cardiologist: Nona Dell MD Reason for Consultation:  Chest Pain  HPI: 52 year old male without prior cardiac history, admitted through the emergency room on 05/27/2012 after prolonged, severe left shoulder pain radiating to his left neck, jaw and associated with a feeling of "tightness" in his left chest. This occurred after he drove his daughter to [place]. The patient had mild associated diaphoresis, no shortness of breath, some nausea. He states that the pain became intense and waned and returned very intensely and waned again without completely going away. Described it as an ache and tightness. He was treated in the emergency room with  nitroglycerin, aspirin, and IV morphine. Cardiac enzymes have been cycled and found to be negative X 3. EKG revealed normal sinus rhythm without clear evidence of ACS, probable early repolarization. He states the symptoms resolved, recurred early morning on Sunday, and have been gone since then. He ambulated some yesterday and felt short of breath, but had no left arm or chest pain.  The patient works as a Merchandiser, retail for the division of [employer], has had very long hours recently with the adverse weather. He did not do any heavy lifting, mostly driving, computer work and paperwork. He has a remote history of pulmonary emboli approximately 10 years ago after right knee surgery. He is no longer on anticoagulation, with the exception of aspirin daily. Other history includes hypertension and hypercholesterolemia. He does admit to chronic back problems and arthritis in his neck.  Review of systems complete and found to be negative unless listed above   Past Medical History  Diagnosis Date  . PE (pulmonary embolism)   . Hypertension     Family History   Problem Relation Age of Onset  . Heart disease Father     History   Social History  . Marital Status: Single    Spouse Name: N/A    Number of Children: N/A  . Years of Education: N/A   Occupational History  . Not on file.   Social History Main Topics  . Smoking status: Never Smoker   . Smokeless tobacco: Not on file  . Alcohol Use: No  . Drug Use: No  . Sexually Active: Not on file   Other Topics Concern  . Not on file   Social History Narrative  . No narrative on file    Past Surgical History  Procedure Laterality Date  . Back surgery    . Knee surgery    . Lung surgery      Unspecified but was done for PE. Had chest tubes. Approx 2003     Prescriptions prior to admission  Medication Sig Dispense Refill  . amLODipine-benazepril (LOTREL) 10-20 MG per capsule Take 1 capsule by mouth daily.      Marland Kitchen atorvastatin (LIPITOR) 20 MG tablet Take 20 mg by mouth daily.      Marland Kitchen diltiazem (TIAZAC) 240 MG 24 hr capsule Take 240 mg by mouth daily.      . meloxicam (MOBIC) 15 MG tablet Take 15 mg by mouth daily.        Physical Exam: Blood pressure 126/77, pulse 67, temperature 97.5 F (36.4 C), temperature source Oral, resp. rate 16, height 5\' 10"  (1.778 m), weight 293 lb 1.6 oz (132.949 kg), SpO2 97.00%.   General: Well developed, well nourished, in no acute  distress, obese. Head: Eyes PERRLA, No xanthomas.   Normal cephalic and atramatic  Lungs: Clear bilaterally to auscultation and percussion. Heart: HRRR S1 S2, without MRG.  Pulses are 2+ & equal.            No carotid bruit. Neck obese. No JVD.  No abdominal bruits. No femoral bruits. Abdomen: Bowel sounds are positive, abdomen soft and non-tender without masses or                  Hernia's noted. Msk:  Back normal, normal gait. Normal strength and tone for age. Extremities: No clubbing, cyanosis or edema. Some soreness with movement of neck and left arm. DP +1 Neuro: Alert and oriented X 3. Psych:  Good affect,  responds appropriately  Labs:   Lab Results  Component Value Date   WBC 9.7 05/28/2012   HGB 15.4 05/28/2012   HCT 44.6 05/28/2012   MCV 89.4 05/28/2012   PLT 275 05/28/2012    Recent Labs Lab 05/28/12 0423  NA 136  K 3.7  CL 99  CO2 27  BUN 13  CREATININE 0.88  CALCIUM 9.0  PROT 7.0  BILITOT 0.7  ALKPHOS 80  ALT 44  AST 26  GLUCOSE 107*   Lab Results  Component Value Date   TROPONINI <0.30 05/28/2012     Radiology: Dg Chest 2 View  05/27/2012  *RADIOLOGY REPORT*  Clinical Data: Shoulder pain and neck pain.  CHEST - 2 VIEW  Comparison: 10/16/2003  Findings: Two views of the chest demonstrate stable elevation of the right hemidiaphragm and chronic densities along the right lateral ribs.  Otherwise, the lungs are clear.  Heart and mediastinum are stable.  Trachea is midline.  No evidence for pleural effusions.  IMPRESSION: No acute chest findings.   Original Report Authenticated By: Richarda Overlie, M.D.    EKG: NSR, rate 84 bpm.   ASSESSMENT AND PLAN:   1. Atypical left arm and chest pain: Patient has cardiovascular risk factors to include hypertension, hypercholesterolemia, obesity, and family history. Cardiac enzymes are negative x3 with no acute ST changes by ECG. He has some left shoulder soreness with range of motion. Also experiencing shortness of breath while walking yesterday in the hall An echocardiogram has been ordered with results pending. Medication adjustments are being made, and an inpatient Lexiscan Myoview will be scheduled for tomorrow morning.  2. Hypertension: Blood pressure is currently very well-controlled. It is noted that the patient is on 2 calcium channel blockers, to include Cardizem and amlodipine.(as part of lotrel as OP), with benazepril 20 mg daily.  Recommend removal of amlodipine, and continue diltiazem. Can increase ACE inhibitor or diltiazem medications, or had additional medication such as HCTZ if blood pressure elevates with removal of amlodipine.    3. Hypercholesterolemia: Will check fasting lipids and LFTs in a.m. He is currently on a statin at home, Lipitor 20 mg daily.  4. PE: D-dimer 0.36 on admission, the followup CT was completed as this is not extremely elevated. He continues on daily aspirin.   Signed: Bettey Mare. Lyman Bishop NP [facility] 05/29/2012, 9:13 AM Co-Sign MD   Attending note:  Patient seen and examined. Reviewed available records and modified above noted by Ms. Lawrence NP. Brian Roach presents with somewhat atypical left shoulder and arm discomfort, radiation to the neck and jaw, also associated with a feeling of tightness in the left chest. This occurred at rest, waxing and waning over the weekend, ultimately resolved following treatment with  nitroglycerin and morphine. He has had some shortness of breath walking in the hall. Cardiac enzymes argue against ACS, and ECG shows no acute ST segment abnormalities. He has been hemodynamically stable.  On examination he is now comfortable, blood pressure 120/77, heart rate in the 60s in sinus rhythm. Lungs are clear and nonlabored. Cardiac exam reveals regular rate and rhythm, no gallop or rub.  Cardiac risk factors include age and gender, family history of CAD, hypertension, hyperlipidemia, overall at least moderate pretest probability for CAD. Plan will be to arrange a [facility] tomorrow morning. We will followup on the echocardiogram that is already ordered. Further plans to follow.  Jonelle Sidle, M.D., F.A.C.C.

## 2012-05-30 ENCOUNTER — Observation Stay (HOSPITAL_COMMUNITY): Payer: BC Managed Care – PPO

## 2012-05-30 ENCOUNTER — Encounter (HOSPITAL_COMMUNITY): Payer: Self-pay

## 2012-05-30 DIAGNOSIS — R079 Chest pain, unspecified: Secondary | ICD-10-CM

## 2012-05-30 MED ORDER — DILTIAZEM HCL ER COATED BEADS 300 MG PO CP24: 300.0000 mg | ORAL_CAPSULE | Freq: Every day | ORAL | Status: DC

## 2012-05-30 MED ORDER — SODIUM CHLORIDE 0.9 % IJ SOLN
INTRAMUSCULAR | Status: AC
  Administered 2012-05-30: 10 mL via INTRAVENOUS
  Filled 2012-05-30: qty 10

## 2012-05-30 MED ORDER — ASPIRIN 81 MG PO TBEC: 81.0000 mg | DELAYED_RELEASE_TABLET | Freq: Every day | ORAL | Status: DC

## 2012-05-30 MED ORDER — REGADENOSON 0.4 MG/5ML IV SOLN
INTRAVENOUS | Status: AC
  Administered 2012-05-30: 0.4 mg via INTRAVENOUS
  Filled 2012-05-30: qty 5

## 2012-05-30 MED ORDER — TECHNETIUM TC 99M SESTAMIBI - CARDIOLITE
30.0000 | Freq: Once | INTRAVENOUS | Status: AC | PRN
  Administered 2012-05-30: 10:00:00 28.5 via INTRAVENOUS

## 2012-05-30 MED ORDER — BENAZEPRIL HCL 20 MG PO TABS: 20.0000 mg | ORAL_TABLET | Freq: Every day | ORAL | Status: DC

## 2012-05-30 MED ORDER — TECHNETIUM TC 99M SESTAMIBI - CARDIOLITE
10.0000 | Freq: Once | INTRAVENOUS | Status: AC | PRN
  Administered 2012-05-30: 08:00:00 9.4 via INTRAVENOUS

## 2012-05-30 NOTE — Progress Notes (Signed)
SUBJECTIVE:Some left shoulder pain last evening, relieved with morphine. Noticing HR elevation with exertion (going to BR).  LABS: Basic Metabolic Panel:  Recent Labs  40/98/11 1618 05/28/12 0423  NA 135 136  K 4.1 3.7  CL 97 99  CO2 26 27  GLUCOSE 109* 107*  BUN 13 13  CREATININE 0.91 0.88  CALCIUM 10.0 9.0   Liver Function Tests:  Recent Labs  05/28/12 0423  AST 26  ALT 44  ALKPHOS 80  BILITOT 0.7  PROT 7.0  ALBUMIN 4.0   CBC:  Recent Labs  05/27/12 1618 05/28/12 0423  WBC 12.1* 9.7  NEUTROABS 8.3*  --   HGB 16.0 15.4  HCT 46.8 44.6  MCV 89.5 89.4  PLT 288 275   Cardiac Enzymes:  Recent Labs  05/27/12 1934 05/28/12 0423 05/28/12 1023  TROPONINI <0.30 <0.30 <0.30   D-Dimer:  Recent Labs  05/27/12 1645  DDIMER 0.36   Hemoglobin A1C:  Recent Labs  05/27/12 1618  HGBA1C 6.4*   Fasting Lipid Panel:  Recent Labs  05/29/12 1045  CHOL 129  HDL 39*  LDLCALC 67  TRIG 914  CHOLHDL 3.3   Thyroid Function Tests:  Recent Labs  05/27/12 1618  TSH 2.158   PHYSICAL EXAM BP 108/69  Pulse 79  Temp(Src) 98.2 F (36.8 C) (Oral)  Resp 18  Ht 5\' 10"  (1.778 m)  Wt 293 lb 1.6 oz (132.949 kg)  BMI 42.06 kg/m2  SpO2 96% General: Well developed, well nourished, in no acute distress Head: Eyes PERRLA, No xanthomas.   Normal cephalic and atramatic  Lungs: Clear bilaterally to auscultation and percussion. Heart: HRRR S1 S2, No MRG .  Pulses are 2+ & equal.            No carotid bruit. No JVD.  No abdominal bruits. No femoral bruits. Abdomen: Bowel sounds are positive, abdomen soft and non-tender without masses or                  Hernia's noted. Msk:  Back normal, normal gait. Normal strength and tone for age. Extremities: No clubbing, cyanosis or edema.  DP +1 Neuro: Alert and oriented X 3. Psych:  Good affect, responds appropriately  TELEMETRY: Reviewed telemetry pt in NSR with early repolarization.  ASSESSMENT AND PLAN:  1. Atypical  left arm and chest pain: Patient has cardiovascular risk factors to include hypertension, hypercholesterolemia, obesity, and family history. Cardiac enzymes are negative x3 with no acute ST changes by ECG. Lexiscan myoview completed this am. Results are pending.  2. Hypertension: Medications adjusted yesterday as he was on two CCB and mildly bradycardic in the low 60's.  Now on diltiazem 300 mg daily with good response.   3. Hypercholesterolemia:  He is currently on a statin at home, Lipitor 20 mg daily. TC 129, TG 116, LDL 67, HDL 39,   4. PE: D-dimer 0.36 on admission, no  followup CT was completed as this is not extremely elevated. He continues on daily aspirin.  Bettey Mare. Lyman Bishop NP [facility] 05/30/2012, 10:22 AM  Cardiology Attending Patient interviewed and examined. Discussed with Joni Reining, NP.  Above note annotated and modified based upon my findings.  Stress nuclear study reviewed. No evidence for ischemia or infarction. Left ventricular systolic function is normal.  Symptoms are improved, but he experienced some discomfort last night. Exam is benign. Symptoms are of some concern, but more likely musculoskeletal or other GI origin than representative of myocardial ischemia. Agree with  plans for discharge today with cardiology followup in approximately 2 weeks. Patient cautioned to call sooner should he continue to note significant chest and shoulder discomfort.  [place] Bing, MD 05/30/2012, 12:15 PM

## 2012-05-30 NOTE — Discharge Summary (Signed)
Physician Discharge Summary  Brian Roach WUJ:[id number] DOB: [birth year] DOA: 05/27/2012  PCP: Bennie Pierini, FNP  Admit date: 05/27/2012 Discharge date: 05/30/2012  Time spent: 40 minutes  Recommendations for Outpatient Follow-up:  1. Follow up with PCP 1 week. 2. Cardiology will follow up to set OP appointment  Discharge Diagnoses:  Principal Problem:   Chest pain at rest Active Problems:   Left shoulder pain   History of pulmonary embolism   Obesity   HTN (hypertension), benign   Hypercholesteremia   Discharge Condition: stable  Diet recommendation: heart healthy  Filed Weights   05/27/12 1546 05/27/12 2059  Weight: 127.007 kg (280 lb) 132.949 kg (293 lb 1.6 oz)    History of present illness:  Brian Roach is a 52 y.o. male who has a past medical history of hypertension, hypercholesterolemia, obesity who was in his usual state of health till about 1:00 pm on 05/27/12 when he got home he started having pain in the left shoulder area, radiating to the arm to the neck and head. Was described as a sharp pain, which was 10 out of 10 in intensity. He also noticed some tightness in the left side of his chest. Denied any physical exertion in the previous day or 2. Denied lifting any heavy weights. Movement did not change the pain. He had minimal shortness of breath and noted that whenever he would take a deep breath the tightness in the left chest increased. He felt a little nauseous, but did not have any vomiting. Felt a little hot and sweaty. Denied any dizziness. Denied palpitations. No leg swelling. He's never had similar symptoms in the past. Denied any history of heart disease. He reported that he had a pulmonary embolism about 10 years ago, after having knee surgery. He required surgical intervention, and chest tubes for that issue at that time. He's had stress test many years ago. Does not recall when it was. He has never had a cardiac catheterization. In the emergency  department he received 4 aspirins and nitroglycerin paste. Pain recurred about half an hour prior to assessment, and he was given morphine, with which the pain down to 4/10. He was anxious. [facility] asked to admit for rule out    Hospital Course:  1. Left shoulder pain/chest discomfort. Patient is ruled out for ACS with negative cardiac markers. Seen by cardiology and underwent lexiscan myoview 05/30/12. Spoke to Dr Dietrich Pates who stated results within normal limits.   2-D echocardiogram yields EF 55% with no wall motion abnormality. Appreciate cardiology assistance. Patient currently feels improved.  Continue aspirin. D dimer was found to be negative 2. Hypertension. Remained controlled during hospitalization. Meds adjusted per cards as pt was on 2 CCB. 3. Hypercholesterolemia. TC 129, TG 116, LDL 67, HDL 39. Continue Lipitor. 4. History of PE approximately 10 years ago. D-dimer was normal. 5. Obesity: BMI 42.1  Procedures: Lexiscan Myoview 05/30/12 Consultations:  Dr. Diona Browner Cardiology  Discharge Exam: Filed Vitals:   05/28/12 2120 05/29/12 0551 05/29/12 1914 05/29/12 2100  BP: 111/74 126/77 137/83 108/69  Pulse: 76 67 88 79  Temp: 97.7 F (36.5 C) 97.5 F (36.4 C) 97.4 F (36.3 C) 98.2 F (36.8 C)  TempSrc: Oral  Oral Oral  Resp: 16 16 18 18   Height:      Weight:      SpO2: 93% 97% 97% 96%    General: awake alert NAD Cardiovascular: s1, S2 RRR  Respiratory: normal effort BS clear bilaterally  Discharge Instructions  Discharge Orders   Future Orders Complete By Expires     Call MD for:  difficulty breathing, headache or visual disturbances  As directed     Call MD for:  persistant dizziness or light-headedness  As directed     Diet - low sodium heart healthy  As directed     Increase activity slowly  As directed         Medication List    STOP taking these medications       amLODipine-benazepril 10-20 MG per capsule  Commonly known as:  LOTREL     diltiazem 240  MG 24 hr capsule  Commonly known as:  TIAZAC  Replaced by:  diltiazem 300 MG 24 hr capsule      TAKE these medications       aspirin 81 MG EC tablet  Take 1 tablet (81 mg total) by mouth daily.     atorvastatin 20 MG tablet  Commonly known as:  LIPITOR  Take 20 mg by mouth daily.     benazepril 20 MG tablet  Commonly known as:  LOTENSIN  Take 1 tablet (20 mg total) by mouth daily.     diltiazem 300 MG 24 hr capsule  Commonly known as:  CARDIZEM CD  Take 1 capsule (300 mg total) by mouth daily.     meloxicam 15 MG tablet  Commonly known as:  MOBIC  Take 15 mg by mouth daily.       Follow-up Information   Follow up with Bennie Pierini, FNP. Schedule an appointment as soon as possible for a visit in 1 week.   Contact information:   [address] [place] [address] [phone]        The results of significant diagnostics from this hospitalization (including imaging, microbiology, ancillary and laboratory) are listed below for reference.    Significant Diagnostic Studies: Dg Chest 2 View  05/27/2012  *RADIOLOGY REPORT*  Clinical Data: Shoulder pain and neck pain.  CHEST - 2 VIEW  Comparison: 10/16/2003  Findings: Two views of the chest demonstrate stable elevation of the right hemidiaphragm and chronic densities along the right lateral ribs.  Otherwise, the lungs are clear.  Heart and mediastinum are stable.  Trachea is midline.  No evidence for pleural effusions.  IMPRESSION: No acute chest findings.   Original Report Authenticated By: Richarda Overlie, M.D.     Microbiology: No results found for this or any previous visit (from the past 240 hour(s)).   Labs: Basic Metabolic Panel:  Recent Labs Lab 05/27/12 1618 05/28/12 0423  NA 135 136  K 4.1 3.7  CL 97 99  CO2 26 27  GLUCOSE 109* 107*  BUN 13 13  CREATININE 0.91 0.88  CALCIUM 10.0 9.0   Liver Function Tests:  Recent Labs Lab 05/28/12 0423  AST 26  ALT 44  ALKPHOS 80  BILITOT 0.7  PROT  7.0  ALBUMIN 4.0   No results found for this basename: LIPASE, AMYLASE,  in the last 168 hours No results found for this basename: AMMONIA,  in the last 168 hours CBC:  Recent Labs Lab 05/27/12 1618 05/28/12 0423  WBC 12.1* 9.7  NEUTROABS 8.3*  --   HGB 16.0 15.4  HCT 46.8 44.6  MCV 89.5 89.4  PLT 288 275   Cardiac Enzymes:  Recent Labs Lab 05/27/12 1618 05/27/12 1934 05/28/12 0423 05/28/12 1023  TROPONINI <0.30 <0.30 <0.30 <0.30   BNP: BNP (last 3 results) No results found for this basename:  PROBNP,  in the last 8760 hours CBG: No results found for this basename: GLUCAP,  in the last 168 hours     Signed:  Gwenyth Bender  [facility] 05/30/2012, 12:50 PM  Attending note:  Patient seen and examined on the day of discharge.  Feeling well, no new complaints.  D dimer negative, Stress test negative.  Follow up with cardiology for further management.  Merdith Boyd

## 2012-05-30 NOTE — Progress Notes (Signed)
Stress Lab Nurses Notes - Brian Roach  Brian Roach 05/30/2012 Reason for doing test: Chest Pain and left arm pain Type of test: [name] / Inpatient Room 302 Nurse performing test: Parke Poisson, RN Nuclear Medicine Tech: Lyndel Pleasure Echo Tech: Not Applicable MD performing test: R. Rothbart & Joni Reining NP Family MD: Bennie Pierini, FNP Test explained and consent signed: yes IV started: 20g jelco, Saline lock flushed, No redness or edema and Saline lock from floor Symptoms: Dizziness & Flushed Treatment/Intervention: None Reason test stopped: protocol completed After recovery IV was: No redness or edema and Saline Lock flushed Patient to return to Nuc. Med at : 10:45 Patient discharged: Transported back to room 302 via w/c Patient's Condition upon discharge was: stable Comments: During test BP 131/61 & HR 114.  Recovery BP 116/67 & HR 84.  Symptoms resolved in recovery. [name]

## 2012-05-30 NOTE — Progress Notes (Signed)
Patient received discharge instructions along with follow up appointments and prescriptions. Patient verbalized understanding of all instructions. Patient ambulated escorted by staff to vehicle . Patient discharged to home in stable condition.

## 2012-06-01 ENCOUNTER — Telehealth: Payer: Self-pay | Admitting: Nurse Practitioner

## 2012-06-01 NOTE — Telephone Encounter (Signed)
APPT MADE

## 2012-06-02 ENCOUNTER — Encounter: Payer: Self-pay | Admitting: *Deleted

## 2012-06-02 ENCOUNTER — Ambulatory Visit (INDEPENDENT_AMBULATORY_CARE_PROVIDER_SITE_OTHER): Payer: BC Managed Care – PPO

## 2012-06-02 ENCOUNTER — Ambulatory Visit (INDEPENDENT_AMBULATORY_CARE_PROVIDER_SITE_OTHER): Payer: BC Managed Care – PPO | Admitting: Physician Assistant

## 2012-06-02 ENCOUNTER — Encounter: Payer: Self-pay | Admitting: Physician Assistant

## 2012-06-02 VITALS — BP 124/76 | HR 64 | Temp 98.5°F | Ht 70.0 in | Wt 294.2 lb

## 2012-06-02 DIAGNOSIS — M25519 Pain in unspecified shoulder: Secondary | ICD-10-CM

## 2012-06-02 DIAGNOSIS — M25512 Pain in left shoulder: Secondary | ICD-10-CM

## 2012-06-02 DIAGNOSIS — M542 Cervicalgia: Secondary | ICD-10-CM

## 2012-06-02 MED ORDER — METHOCARBAMOL 750 MG PO TABS: 750.0000 mg | ORAL_TABLET | Freq: Every day | ORAL | Status: DC

## 2012-06-02 MED ORDER — MELOXICAM 15 MG PO TABS: 15.0000 mg | ORAL_TABLET | Freq: Every day | ORAL | Status: DC

## 2012-06-02 NOTE — Progress Notes (Signed)
  Subjective:    Patient ID: Brian Roach, male    DOB: 1960-07-12, 52 y.o.   MRN: [id number]  HPI Hospitalized for chest pain and left shoulder pain that started Sat; was hospitalized at [facility] with cardiac work up; determined not to be a cardiac episode    Review of Systems  All other systems reviewed and are negative.       Objective:   Physical Exam  Constitutional: No distress.  Truncal obesity  HENT:  Head: Normocephalic and atraumatic.  Eyes: Conjunctivae and EOM are normal. Pupils are equal, round, and reactive to light.  Neck: Normal range of motion. Neck supple.  Cardiovascular: Normal rate, regular rhythm and normal heart sounds.   Pulmonary/Chest: Effort normal and breath sounds normal.  Musculoskeletal:  C-spine no extension, flexion 5-10 degrees; R & L rotation 5 degrees Left shoulder abduction 120-125 degrees Significant trapezial muscle tightness   [facility] reading (PRIMARY) by  Dr. Caryn Bee, PA-C mild degeneration                                         Assessment & Plan:  Pain in joint, shoulder region, left - Plan: DG Shoulder Left, meloxicam (MOBIC) 15 MG tablet  Cervicalgia - Plan: methocarbamol (ROBAXIN-750) 750 MG tablet

## 2012-06-05 ENCOUNTER — Telehealth: Payer: Self-pay | Admitting: *Deleted

## 2012-06-05 NOTE — Telephone Encounter (Signed)
Message copied by Almeta Monas on Mon Jun 05, 2012  1:02 PM ------      Message from: Horald Pollen      Created: Fri Jun 02, 2012  1:55 PM       Shoulder pain from arthritis and spurring ------

## 2012-06-05 NOTE — Telephone Encounter (Signed)
Pt aware of results 

## 2012-06-07 ENCOUNTER — Ambulatory Visit: Payer: Self-pay | Admitting: Nurse Practitioner

## 2012-06-13 ENCOUNTER — Encounter: Payer: Self-pay | Admitting: Adult Health

## 2012-06-13 ENCOUNTER — Ambulatory Visit (INDEPENDENT_AMBULATORY_CARE_PROVIDER_SITE_OTHER): Payer: BC Managed Care – PPO | Admitting: Adult Health

## 2012-06-13 VITALS — BP 124/78 | HR 67 | Ht 70.0 in | Wt 295.0 lb

## 2012-06-13 DIAGNOSIS — I10 Essential (primary) hypertension: Secondary | ICD-10-CM

## 2012-06-13 DIAGNOSIS — M25512 Pain in left shoulder: Secondary | ICD-10-CM

## 2012-06-13 DIAGNOSIS — E669 Obesity, unspecified: Secondary | ICD-10-CM

## 2012-06-13 DIAGNOSIS — M25519 Pain in unspecified shoulder: Secondary | ICD-10-CM

## 2012-06-13 DIAGNOSIS — E7800 Pure hypercholesterolemia, unspecified: Secondary | ICD-10-CM

## 2012-06-13 NOTE — Assessment & Plan Note (Signed)
Blood pressure is currently very well-controlled on Lotensin 20 mg daily, diltiazem 300 mg daily. He will continue to follow with his primary care physician for ongoing medical management. We will see him again in one year.

## 2012-06-13 NOTE — Assessment & Plan Note (Signed)
Weight loss is recommended and increased exercise for healthy lifestyle.

## 2012-06-13 NOTE — Progress Notes (Deleted)
Name: Brian Roach    DOB: 1960-07-25  Age: 52 y.o.  MR#: [id number]       PCP:  Rudi Heap, MD      Insurance: Payor: BLUE CROSS BLUE SHIELD  Plan: [facility] PPO  Product Type: *No Product type*    CC:    Chief Complaint  Patient presents with  . Chest Pain    VS Filed Vitals:   06/13/12 1319  BP: 124/78  Pulse: 67  Height: 5\' 10"  (1.778 m)  Weight: 295 lb (133.811 kg)  SpO2: 97%    Weights Current Weight  06/13/12 295 lb (133.811 kg)  06/02/12 292 lb (132.45 kg)  06/02/12 294 lb 3.2 oz (133.448 kg)    Blood Pressure  BP Readings from Last 3 Encounters:  06/13/12 124/78  06/02/12 123/83  06/02/12 124/76     Admit date:  (Not on file) Last encounter with RMR:  Visit date not found   Allergy Review of patient's allergies indicates no known allergies.  Current Outpatient Prescriptions  Medication Sig Dispense Refill  . aspirin EC 81 MG EC tablet Take 1 tablet (81 mg total) by mouth daily.      Marland Kitchen atorvastatin (LIPITOR) 20 MG tablet Take 20 mg by mouth daily.      . benazepril (LOTENSIN) 20 MG tablet Take 1 tablet (20 mg total) by mouth daily.  30 tablet  0  . diltiazem (CARDIZEM CD) 300 MG 24 hr capsule Take 1 capsule (300 mg total) by mouth daily.  30 capsule  0  . meloxicam (MOBIC) 15 MG tablet Take 1 tablet (15 mg total) by mouth daily.  30 tablet  0  . methocarbamol (ROBAXIN-750) 750 MG tablet Take 1 tablet (750 mg total) by mouth at bedtime.  30 tablet  0   No current facility-administered medications for this visit.    Discontinued Meds:   There are no discontinued medications.  Patient Active Problem List  Diagnosis  . Chest pain at rest  . Left shoulder pain  . History of pulmonary embolism  . Obesity  . HTN (hypertension), benign  . Hypercholesteremia    LABS    Component Value Date/Time   NA 136 05/28/2012 0423   NA 135 05/27/2012 1618   K 3.7 05/28/2012 0423   K 4.1 05/27/2012 1618   CL 99 05/28/2012 0423   CL 97 05/27/2012 1618    CO2 27 05/28/2012 0423   CO2 26 05/27/2012 1618   GLUCOSE 107* 05/28/2012 0423   GLUCOSE 109* 05/27/2012 1618   BUN 13 05/28/2012 0423   BUN 13 05/27/2012 1618   CREATININE 0.88 05/28/2012 0423   CREATININE 0.91 05/27/2012 1618   CALCIUM 9.0 05/28/2012 0423   CALCIUM 10.0 05/27/2012 1618   GFRNONAA >90 05/28/2012 0423   GFRNONAA >90 05/27/2012 1618   GFRAA >90 05/28/2012 0423   GFRAA >90 05/27/2012 1618   CMP     Component Value Date/Time   NA 136 05/28/2012 0423   K 3.7 05/28/2012 0423   CL 99 05/28/2012 0423   CO2 27 05/28/2012 0423   GLUCOSE 107* 05/28/2012 0423   BUN 13 05/28/2012 0423   CREATININE 0.88 05/28/2012 0423   CALCIUM 9.0 05/28/2012 0423   PROT 7.0 05/28/2012 0423   ALBUMIN 4.0 05/28/2012 0423   AST 26 05/28/2012 0423   ALT 44 05/28/2012 0423   ALKPHOS 80 05/28/2012 0423   BILITOT 0.7 05/28/2012 0423   GFRNONAA >90 05/28/2012 0423   GFRAA >  90 05/28/2012 0423       Component Value Date/Time   WBC 9.7 05/28/2012 0423   WBC 12.1* 05/27/2012 1618   HGB 15.4 05/28/2012 0423   HGB 16.0 05/27/2012 1618   HCT 44.6 05/28/2012 0423   HCT 46.8 05/27/2012 1618   MCV 89.4 05/28/2012 0423   MCV 89.5 05/27/2012 1618    Lipid Panel     Component Value Date/Time   CHOL 129 05/29/2012 1045   TRIG 116 05/29/2012 1045   HDL 39* 05/29/2012 1045   CHOLHDL 3.3 05/29/2012 1045   VLDL 23 05/29/2012 1045   LDLCALC 67 05/29/2012 1045    ABG No results found for this basename: phart, pco2, pco2art, po2, po2art, hco3, tco2, acidbasedef, o2sat     Lab Results  Component Value Date   TSH 2.158 05/27/2012   BNP (last 3 results) No results found for this basename: PROBNP,  in the last 8760 hours Cardiac Panel (last 3 results) No results found for this basename: CKTOTAL, CKMB, TROPONINI, RELINDX,  in the last 72 hours  Iron/TIBC/Ferritin No results found for this basename: iron, tibc, ferritin     EKG Orders placed during the hospital encounter of 05/27/12  . EKG 12-LEAD  . EKG 12-LEAD  . EKG 12-LEAD   . EKG 12-LEAD  . EKG 12-LEAD  . EKG 12-LEAD  . EKG  . EKG  . EKG  . EKG  . EKG 12-LEAD  . EKG 12-LEAD  . EKG  . EKG     Prior Assessment and Plan Problem List as of 06/13/2012     ICD-9-CM     Cardiology Problems   HTN (hypertension), benign   Hypercholesteremia     Other   Chest pain at rest   Left shoulder pain   History of pulmonary embolism   Obesity       Imaging: Dg Chest 2 View  05/27/2012  *RADIOLOGY REPORT*  Clinical Data: Shoulder pain and neck pain.  CHEST - 2 VIEW  Comparison: 10/16/2003  Findings: Two views of the chest demonstrate stable elevation of the right hemidiaphragm and chronic densities along the right lateral ribs.  Otherwise, the lungs are clear.  Heart and mediastinum are stable.  Trachea is midline.  No evidence for pleural effusions.  IMPRESSION: No acute chest findings.   Original Report Authenticated By: Richarda Overlie, M.D.    Nm Myocar Single W/spect W/wall Motion And Ef  06/01/2012  nm myoview pharmacologic stress  Ordering Physician: Joni Reining  Reading Physician: [place] Bing  Clinical Data: 52 year old gentleman with known known cardiac disease admitted to hospital with chest pain.  NUCLEAR MEDICINE ADENOSINE STRESS MYOVIEW STUDY WITH SPECT AND LEFT VENTRIUCLAR EJECTION FRACTION  Radionuclide Data: One-day rest/stress protocol performed with 10/30 mCi of Tc-68m Myoview.  Stress Data: Regadenoson infusion resulted in a modest increase in heart rate and an atypical increase in systolic blood pressure.  No symptoms reported.  No important arrhythmias noted. Fairly frequent PACs throughout.  EKG: Normal sinus rhythm; borderline left atrial abnormality; delayed R-wave progression; J point elevation.  No significant change with pharmacologic stress.  Scintigraphic Data: Acquisition notable for diaphragmatic attenuation and maintenance of the patient's left arm at his side. Left ventricular size was at the upper limit of normal.  On tomographic  images reconstructed in standard planes, there was uniform and normal uptake of tracer in all myocardial segments. The rest images were unchanged.  The gated reconstruction demonstrated normal regional and global LV systolic function as  well as normal systolic accentuation of activity in all myocardial segments.  Estimated ejection fraction was 61%.  IMPRESSION: Negative pharmacologic stress nuclear myocardial study.   Original Report Authenticated By: [place] Bing    Dg Shoulder Left  06/02/2012  *RADIOLOGY REPORT*  Clinical Data: Shoulder pain.  No known injury.  LEFT SHOULDER - 2+ VIEW  Comparison: None.  Findings: [facility] joint space is narrowed.  There is degenerative spurring of the acromion process.  Glenohumeral joint spaces preserved.  There is minimal degenerative spurring of the greater tuberosity of the humerus.  No fracture or bony destruction is evident.  No calcific bursitis or calcific tendonitis is evident. No cervical rib is seen.  IMPRESSION: Narrowing of AC joint space.  Degenerative spurring of acromion process.  Degenerative spurring of the greater tuberosity of the humerus.   Original Report Authenticated By: Onalee Hua Call

## 2012-06-13 NOTE — Patient Instructions (Addendum)
Your physician recommends that you schedule a follow-up appointment in: 1 year  

## 2012-06-13 NOTE — Assessment & Plan Note (Signed)
He had been advised a low-cholesterol diet. He will continue on Lipitor 20 mg daily with followup labs in 6 months for fasting lipids and LFTs which can be completed through his primary care physician on annual evaluation.

## 2012-06-13 NOTE — Progress Notes (Signed)
   HPI: [name] is a 52 year old patient of Dr. Diona Browner we are following for ongoing assessment and management of noncardiac chest pain, with history of hypertension, PE, and hypercholesterolemia. The patient was seen on consultation while hospitalized for chest pain. He underwent a nuclear medicine stress test which was found to be negative for ischemia. He is here on posthospitalization followup, for risk management. He has since followed up with his primary care physician who did an x-ray of his left shoulder which showed arthritis and bone spurs. He has not had an MRI of the shoulder. He continues to have some discomfort in his shoulder at times. Otherwise he has been without complaints.  No Known Allergies  Current Outpatient Prescriptions  Medication Sig Dispense Refill  . aspirin EC 81 MG EC tablet Take 1 tablet (81 mg total) by mouth daily.      Marland Kitchen atorvastatin (LIPITOR) 20 MG tablet Take 20 mg by mouth daily.      . benazepril (LOTENSIN) 20 MG tablet Take 1 tablet (20 mg total) by mouth daily.  30 tablet  0  . diltiazem (CARDIZEM CD) 300 MG 24 hr capsule Take 1 capsule (300 mg total) by mouth daily.  30 capsule  0  . meloxicam (MOBIC) 15 MG tablet Take 1 tablet (15 mg total) by mouth daily.  30 tablet  0  . methocarbamol (ROBAXIN-750) 750 MG tablet Take 1 tablet (750 mg total) by mouth at bedtime.  30 tablet  0   No current facility-administered medications for this visit.    Past Medical History  Diagnosis Date  . PE (pulmonary embolism)   . Hypertension     Past Surgical History  Procedure Laterality Date  . Back surgery    . Knee surgery    . Lung surgery      Unspecified but was done for PE. Had chest tubes. Approx 2003  . Anterior cruciate ligament repair Right     ZOX:WRUEAV of systems complete and found to be negative unless listed above  PHYSICAL EXAM BP 124/78  Pulse 67  Ht 5\' 10"  (1.778 m)  Wt 295 lb (133.811 kg)  BMI 42.33 kg/m2  SpO2 97%  General:  Well developed, well nourished, in no acute distress Head: Eyes PERRLA, No xanthomas.   Normal cephalic and atramatic  Lungs: Clear bilaterally to auscultation and percussion. Heart: HRRR S1 S2, without MRG.  Pulses are 2+ & equal.            No carotid bruit. No JVD.  No abdominal bruits. No femoral bruits. Abdomen: Bowel sounds are positive, abdomen soft and non-tender without masses or                  Hernia's noted. Msk:  Back normal, normal gait. Normal strength and tone for age. Extremities: No clubbing, cyanosis or edema. Pain with range of movement of left shoulder especially by putting arm behind his back with pain at the upper left chest.  DP +1 Neuro: Alert and oriented X 3. Psych:  Good affect, responds appropriately    ASSESSMENT AND PLAN

## 2012-06-13 NOTE — Assessment & Plan Note (Signed)
Recommend MRI of the left shoulder to rule out rotator cuff tear at the discretion of his primary care physician or referral to orthopedic surgeon.

## 2012-07-05 ENCOUNTER — Other Ambulatory Visit: Payer: Self-pay | Admitting: Nurse Practitioner

## 2012-07-05 ENCOUNTER — Telehealth: Payer: Self-pay | Admitting: *Deleted

## 2012-07-05 MED ORDER — DILTIAZEM HCL ER COATED BEADS 300 MG PO CP24: 300.0000 mg | ORAL_CAPSULE | Freq: Every day | ORAL | Status: DC

## 2012-07-05 MED ORDER — BENAZEPRIL HCL 20 MG PO TABS: 20.0000 mg | ORAL_TABLET | Freq: Every day | ORAL | Status: DC

## 2012-07-05 NOTE — Telephone Encounter (Addendum)
Pt called to request refills on BP medications, advised we will research the refills, noted pt establish OV and KL notations made as follows:  Blood pressure is currently very well-controlled on Lotensin 20 mg daily, diltiazem 300 mg daily. He will continue to follow with his primary care physician for ongoing medical management. We will see him again in one year.      Left message with pt vm to advise  Pt returned call and reviewed that pt medication was added by [facility] hospitia list Toya Smothers NP on 05-30-12 for a 30 day supply of Lotensin, and Cardizem, pt advised he no longer has a current PCP and is looking for one, can we fill his medications until then, contacted [facility] and advised per practice protocol, medication was prescribed by our MD and not narcotic, noted as BP medications that our office prescribes,KL and SM not in office today and pt out of medications, advised to fill for pt with the understanding that he look for a new PCP, pt understood and refills sent to pt pharmacy via escribe

## 2012-08-23 ENCOUNTER — Encounter (HOSPITAL_COMMUNITY): Payer: Self-pay | Admitting: Cardiology

## 2012-10-09 ENCOUNTER — Other Ambulatory Visit: Payer: Self-pay | Admitting: Physician Assistant

## 2012-10-11 NOTE — Telephone Encounter (Signed)
Last seen for checkup 08/12/11

## 2013-01-04 ENCOUNTER — Other Ambulatory Visit: Payer: Self-pay | Admitting: Nurse Practitioner

## 2013-01-05 NOTE — Telephone Encounter (Signed)
ntbs

## 2013-01-05 NOTE — Telephone Encounter (Signed)
Last seen 06/02/12  [facility]   Pharmacy requesting a 90 day supply

## 2013-01-08 ENCOUNTER — Other Ambulatory Visit: Payer: Self-pay | Admitting: Nurse Practitioner

## 2013-01-18 ENCOUNTER — Other Ambulatory Visit: Payer: Self-pay | Admitting: Nurse Practitioner

## 2013-01-19 NOTE — Telephone Encounter (Signed)
Patient notified at last refill that NTBS. Please advise 

## 2013-04-05 ENCOUNTER — Encounter: Payer: Self-pay | Admitting: Nurse Practitioner

## 2013-04-05 ENCOUNTER — Ambulatory Visit (INDEPENDENT_AMBULATORY_CARE_PROVIDER_SITE_OTHER): Payer: BC Managed Care – PPO | Admitting: Nurse Practitioner

## 2013-04-05 VITALS — BP 139/82 | HR 67 | Temp 98.4°F | Ht 70.0 in | Wt 286.0 lb

## 2013-04-05 DIAGNOSIS — J019 Acute sinusitis, unspecified: Secondary | ICD-10-CM

## 2013-04-05 MED ORDER — AZITHROMYCIN 250 MG PO TABS: ORAL_TABLET | ORAL | Status: DC

## 2013-04-05 MED ORDER — BENAZEPRIL HCL 20 MG PO TABS: ORAL_TABLET | ORAL | Status: DC

## 2013-04-05 MED ORDER — ATORVASTATIN CALCIUM 20 MG PO TABS: ORAL_TABLET | ORAL | Status: DC

## 2013-04-05 NOTE — Progress Notes (Signed)
   Subjective:    Patient ID: Brian Roach, male    DOB: 06/19/1960, 53 y.o.   MRN: [id number]  HPI  PAtient in c/o nasla congestion and cough for greater then 3 weeks- fever intermittently.    Review of Systems  Constitutional: Negative for fever and appetite change.  HENT: Positive for congestion, rhinorrhea and sinus pressure. Negative for sore throat and trouble swallowing.   Respiratory: Negative for cough.   Cardiovascular: Negative.   Gastrointestinal: Negative.   Genitourinary: Negative.   Musculoskeletal: Negative.   Neurological: Negative.   All other systems reviewed and are negative.       Objective:   Physical Exam  Constitutional: He is oriented to person, place, and time. He appears well-developed and well-nourished.  HENT:  Right Ear: Hearing, tympanic membrane, external ear and ear canal normal.  Left Ear: Hearing, tympanic membrane, external ear and ear canal normal.  Nose: Mucosal edema and rhinorrhea present. Right sinus exhibits maxillary sinus tenderness. Right sinus exhibits no frontal sinus tenderness. Left sinus exhibits maxillary sinus tenderness. Left sinus exhibits no frontal sinus tenderness.  Mouth/Throat: Uvula is midline, oropharynx is clear and moist and mucous membranes are normal.  Eyes: Conjunctivae are normal. Pupils are equal, round, and reactive to light.  Neck: Normal range of motion. Neck supple.  Cardiovascular: Normal rate, regular rhythm and normal heart sounds.   Pulmonary/Chest: Effort normal and breath sounds normal.  Abdominal: Soft. Bowel sounds are normal.  Lymphadenopathy:    He has no cervical adenopathy.  Neurological: He is alert and oriented to person, place, and time.  Skin: Skin is warm.  Psychiatric: He has a normal mood and affect. His behavior is normal. Judgment and thought content normal.   BP 139/82  Pulse 67  Temp(Src) 98.4 F (36.9 C) (Oral)  Ht 5\' 10"  (1.778 m)  Wt 286 lb (129.729 kg)  BMI 41.04  kg/m2        Assessment & Plan:   1. Acute rhinosinusitis    Meds ordered this encounter  Medications  . azithromycin (ZITHROMAX Z-PAK) 250 MG tablet    Sig: As directed    Dispense:  6 each    Refill:  0    Order Specific Question:  Supervising Provider    Answer:  Ernestina PennaMOORE, DONALD W [[id number]]   1. Take meds as prescribed 2. Use a cool mist humidifier especially during the winter months and when heat has been humid. 3. Use saline nose sprays frequently 4. Saline irrigations of the nose can be very helpful if done frequently.  * 4X daily for 1 week*  * Use of a nettie pot can be helpful with this. Follow directions with this* 5. Drink plenty of fluids 6. Keep thermostat turn down low 7.For any cough or congestion  Use plain Mucinex- regular strength or max strength is fine   * Children- consult with Pharmacist for dosing 8. For fever or aces or pains- take tylenol or ibuprofen appropriate for age and weight.  * for fevers greater than 101 orally you may alternate ibuprofen and tylenol every  3 hours.   Mary-Margaret Daphine DeutscherMartin, FNP

## 2013-04-05 NOTE — Patient Instructions (Signed)

## 2013-05-23 ENCOUNTER — Telehealth: Payer: Self-pay | Admitting: *Deleted

## 2013-05-23 MED ORDER — DILTIAZEM HCL ER BEADS 300 MG PO CP24: 300.0000 mg | ORAL_CAPSULE | Freq: Every day | ORAL | Status: DC

## 2013-05-23 NOTE — Telephone Encounter (Signed)
Received fax from pharmacy requesting a refill on diltiazem 24hr ER 300mg  caps. Take 1 capsule by mouth daily. Do not see on med list. Please advise

## 2013-05-23 NOTE — Telephone Encounter (Signed)
rx sent to pharmacy

## 2013-05-25 ENCOUNTER — Ambulatory Visit (INDEPENDENT_AMBULATORY_CARE_PROVIDER_SITE_OTHER): Payer: BC Managed Care – PPO | Admitting: Physician Assistant

## 2013-05-25 ENCOUNTER — Encounter: Payer: Self-pay | Admitting: Physician Assistant

## 2013-05-25 VITALS — BP 115/75 | HR 62 | Temp 98.1°F | Ht 70.0 in | Wt 284.0 lb

## 2013-05-25 DIAGNOSIS — L820 Inflamed seborrheic keratosis: Secondary | ICD-10-CM

## 2013-06-11 NOTE — Progress Notes (Signed)
   Subjective:    Patient ID: Brian Roach, male    DOB: 01/30/1961, 53 y.o.   MRN: [id number]  HPI 53 y/o male presents with pigmented lesion on left temple that has changed in appearance and is enlarging. Irritated when he brushes his hair    Review of Systems  +changing, enlarging raised lesion on left temple - for bleeding, drainage     Objective:   Physical Exam  Scaling, pigmented lesion on left temple , appearing to be seborrheic keratosis in etiology and benign. Negative for bleeding, abnormalities indicating the need for biopsy        Assessment & Plan:  1. Benign seborrheic keratosis, irritated: tx w/ cryosurgery x 1. F/U prn. Apply vaseline post procedure if needed.

## 2013-06-27 ENCOUNTER — Ambulatory Visit (INDEPENDENT_AMBULATORY_CARE_PROVIDER_SITE_OTHER): Payer: BC Managed Care – PPO | Admitting: Family Medicine

## 2013-06-27 ENCOUNTER — Encounter: Payer: Self-pay | Admitting: Family Medicine

## 2013-06-27 VITALS — BP 120/80 | HR 83 | Temp 97.1°F | Ht 70.0 in | Wt 284.8 lb

## 2013-06-27 DIAGNOSIS — I10 Essential (primary) hypertension: Secondary | ICD-10-CM

## 2013-06-27 DIAGNOSIS — M549 Dorsalgia, unspecified: Secondary | ICD-10-CM

## 2013-06-27 DIAGNOSIS — E785 Hyperlipidemia, unspecified: Secondary | ICD-10-CM

## 2013-06-27 DIAGNOSIS — M542 Cervicalgia: Secondary | ICD-10-CM

## 2013-06-27 DIAGNOSIS — R5383 Other fatigue: Secondary | ICD-10-CM

## 2013-06-27 DIAGNOSIS — R5381 Other malaise: Secondary | ICD-10-CM

## 2013-06-27 LAB — POCT CBC
Granulocyte percent: 79.5 %G (ref 37–80)
HCT, POC: 46.3 % (ref 43.5–53.7)
Hemoglobin: 14.6 g/dL (ref 14.1–18.1)
Lymph, poc: 2 (ref 0.6–3.4)
MCH, POC: 28.8 pg (ref 27–31.2)
MCHC: 31.5 g/dL — AB (ref 31.8–35.4)
MCV: 91.4 fL (ref 80–97)
MPV: 9.5 fL (ref 0–99.8)
POC Granulocyte: 8.7 — AB (ref 2–6.9)
POC LYMPH PERCENT: 18.4 %L (ref 10–50)
Platelet Count, POC: 224 10*3/uL (ref 142–424)
RBC: 5.1 M/uL (ref 4.69–6.13)
RDW, POC: 13.9 %
WBC: 11 10*3/uL — AB (ref 4.6–10.2)

## 2013-06-27 MED ORDER — NAPROXEN 500 MG PO TABS: 500.0000 mg | ORAL_TABLET | Freq: Two times a day (BID) | ORAL | Status: DC

## 2013-06-27 MED ORDER — HYDROCODONE-ACETAMINOPHEN 5-325 MG PO TABS: 1.0000 | ORAL_TABLET | Freq: Four times a day (QID) | ORAL | Status: DC | PRN

## 2013-06-27 NOTE — Progress Notes (Signed)
   Subjective:    Patient ID: Brian Roach, male    DOB: 10-Feb-1961, 53 y.o.   MRN: [id number]  HPI This 53 y.o. male presents for evaluation of back pain for 2 days now after moving and cutting limbs from a tree.  He has been having severe pain in his back.  He has hx of OA and back pain.  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  He has hx of htn, hyperlipidemia, MO, and  OA.  He is due for CPE labs.   Review of Systems C/o back pain   No chest pain, SOB, HA, dizziness, vision change, N/V, diarrhea, constipation, dysuria, urinary urgency or frequency, myalgias, arthralgias or rash.  Objective:   Physical Exam  Vital signs noted  Well developed well nourished male.  HEENT - Head atraumatic Normocephalic                Eyes - PERRLA, Conjuctiva - clear Sclera- Clear EOMI                Ears - EAC's Wnl TM's Wnl Gross Hearing                Throat - oropharanx wnl Respiratory - Lungs CTA bilateral Cardiac - RRR S1 and S2 without murmur GI - Abdomen soft Nontender and bowel sounds active x 4 Extremities - No edema. Neuro - Grossly intact. MS - TTP bilateral LS muscles and decreased ROM LS spine     Assessment & Plan:  Hyperlipemia - Plan: CMP14+EGFR, Lipid panel  Hypertension - Plan: POCT CBC  Fatigue - Plan: Vit D  25 hydroxy (rtn osteoporosis monitoring), Thyroid Panel With TSH, PSA, total and free  Back pain - Plan: naproxen (NAPROSYN) 500 MG tablet, HYDROcodone-acetaminophen (NORCO) 5-325 MG per tablet  Lysbeth Penner FNP

## 2013-06-28 LAB — CMP14+EGFR
ALT: 32 IU/L (ref 0–44)
AST: 25 IU/L (ref 0–40)
Albumin/Globulin Ratio: 1.6 (ref 1.1–2.5)
Albumin: 4.4 g/dL (ref 3.5–5.5)
Alkaline Phosphatase: 85 IU/L (ref 39–117)
BUN/Creatinine Ratio: 15 (ref 9–20)
BUN: 15 mg/dL (ref 6–24)
CO2: 25 mmol/L (ref 18–29)
Calcium: 9.5 mg/dL (ref 8.7–10.2)
Chloride: 100 mmol/L (ref 97–108)
Creatinine, Ser: 0.99 mg/dL (ref 0.76–1.27)
GFR calc Af Amer: 99 mL/min/{1.73_m2} (ref >59)
GFR calc non Af Amer: 86 mL/min/{1.73_m2} (ref >59)
Globulin, Total: 2.7 g/dL (ref 1.5–4.5)
Glucose: 90 mg/dL (ref 65–99)
Potassium: 4.2 mmol/L (ref 3.5–5.2)
Sodium: 141 mmol/L (ref 134–144)
Total Bilirubin: 0.7 mg/dL (ref 0.0–1.2)
Total Protein: 7.1 g/dL (ref 6.0–8.5)

## 2013-06-28 LAB — LIPID PANEL
Chol/HDL Ratio: 3.3 ratio units (ref 0.0–5.0)
Cholesterol, Total: 137 mg/dL (ref 100–199)
HDL: 42 mg/dL (ref >39)
LDL Calculated: 78 mg/dL (ref 0–99)
Triglycerides: 84 mg/dL (ref 0–149)
VLDL Cholesterol Cal: 17 mg/dL (ref 5–40)

## 2013-06-28 LAB — PSA, TOTAL AND FREE
PSA, Free Pct: 27.5 %
PSA, Free: 0.22 ng/mL
PSA: 0.8 ng/mL (ref 0.0–4.0)

## 2013-06-28 LAB — VITAMIN D 25 HYDROXY (VIT D DEFICIENCY, FRACTURES): Vit D, 25-Hydroxy: 24.4 ng/mL — ABNORMAL LOW (ref 30.0–100.0)

## 2013-06-28 LAB — THYROID PANEL WITH TSH
Free Thyroxine Index: 1.9 (ref 1.2–4.9)
T3 Uptake Ratio: 28 % (ref 24–39)
T4, Total: 6.7 ug/dL (ref 4.5–12.0)
TSH: 4.2 u[IU]/mL (ref 0.450–4.500)

## 2013-07-02 ENCOUNTER — Telehealth: Payer: Self-pay | Admitting: Family Medicine

## 2013-07-03 ENCOUNTER — Other Ambulatory Visit: Payer: Self-pay | Admitting: *Deleted

## 2013-07-03 ENCOUNTER — Ambulatory Visit (INDEPENDENT_AMBULATORY_CARE_PROVIDER_SITE_OTHER): Payer: BC Managed Care – PPO | Admitting: Family Medicine

## 2013-07-03 VITALS — BP 150/85 | HR 81 | Temp 99.6°F | Ht 70.0 in | Wt 282.6 lb

## 2013-07-03 DIAGNOSIS — M549 Dorsalgia, unspecified: Secondary | ICD-10-CM

## 2013-07-03 MED ORDER — HYDROCODONE-ACETAMINOPHEN 5-325 MG PO TABS: 1.0000 | ORAL_TABLET | Freq: Four times a day (QID) | ORAL | Status: DC | PRN

## 2013-07-03 MED ORDER — BENAZEPRIL HCL 20 MG PO TABS: ORAL_TABLET | ORAL | Status: DC

## 2013-07-03 NOTE — Telephone Encounter (Signed)
Appt scheduled for today.

## 2013-07-03 NOTE — Progress Notes (Signed)
   Subjective:    Patient ID: Brian Roach, male    DOB: 09/12/1960, 53 y.o.   MRN: [id number]  HPI This 53 y.o. male presents for evaluation of continued back pain in the center of his back..   Review of Systems No chest pain, SOB, HA, dizziness, vision change, N/V, diarrhea, constipation, dysuria, urinary urgency or frequency, myalgias, arthralgias or rash.     Objective:   Physical Exam  Vital signs noted  Well developed well nourished male.  HEENT - Head atraumatic Normocephalic                Eyes - PERRLA, Conjuctiva - clear Sclera- Clear EOMI                Ears - EAC's Wnl TM's Wnl Gross Hearing WNL                Nose - Nares patent                 Throat - oropharanx wnl Respiratory - Lungs CTA bilateral Cardiac - RRR S1 and S2 without murmur GI - Abdomen soft Nontender and bowel sounds active x 4 Extremities - No edema. Neuro - Grossly intact.      Assessment & Plan:  Back pain - Plan: HYDROcodone-acetaminophen (NORCO) 5-325 MG per tablet, Ambulatory referral to Physical Therapy  Deatra CanterWilliam J Oxford FNP

## 2013-07-09 ENCOUNTER — Other Ambulatory Visit: Payer: Self-pay | Admitting: Family Medicine

## 2013-07-10 ENCOUNTER — Ambulatory Visit: Payer: BC Managed Care – PPO | Attending: Family Medicine | Admitting: Physical Therapy

## 2013-07-10 DIAGNOSIS — R293 Abnormal posture: Secondary | ICD-10-CM | POA: Insufficient documentation

## 2013-07-10 DIAGNOSIS — M545 Low back pain, unspecified: Secondary | ICD-10-CM | POA: Diagnosis not present

## 2013-07-10 DIAGNOSIS — IMO0001 Reserved for inherently not codable concepts without codable children: Secondary | ICD-10-CM | POA: Diagnosis not present

## 2013-07-10 DIAGNOSIS — R5381 Other malaise: Secondary | ICD-10-CM | POA: Insufficient documentation

## 2013-07-11 NOTE — Telephone Encounter (Signed)
Rx last filled on 06-27-13 for #30. Patient last seen in office on 07-03-13. Please advise

## 2013-07-13 ENCOUNTER — Ambulatory Visit: Payer: BC Managed Care – PPO | Admitting: Physical Therapy

## 2013-07-13 DIAGNOSIS — IMO0001 Reserved for inherently not codable concepts without codable children: Secondary | ICD-10-CM | POA: Diagnosis not present

## 2013-07-16 ENCOUNTER — Ambulatory Visit: Payer: BC Managed Care – PPO | Admitting: Physical Therapy

## 2013-07-16 DIAGNOSIS — IMO0001 Reserved for inherently not codable concepts without codable children: Secondary | ICD-10-CM | POA: Diagnosis not present

## 2013-07-18 ENCOUNTER — Ambulatory Visit: Payer: BC Managed Care – PPO | Admitting: Physical Therapy

## 2013-07-18 DIAGNOSIS — IMO0001 Reserved for inherently not codable concepts without codable children: Secondary | ICD-10-CM | POA: Diagnosis not present

## 2013-07-20 ENCOUNTER — Ambulatory Visit (INDEPENDENT_AMBULATORY_CARE_PROVIDER_SITE_OTHER): Payer: BC Managed Care – PPO | Admitting: Physician Assistant

## 2013-07-20 ENCOUNTER — Encounter: Payer: Self-pay | Admitting: Physician Assistant

## 2013-07-20 ENCOUNTER — Ambulatory Visit (INDEPENDENT_AMBULATORY_CARE_PROVIDER_SITE_OTHER): Payer: BC Managed Care – PPO

## 2013-07-20 VITALS — BP 148/97 | HR 62 | Temp 97.8°F | Ht 70.0 in | Wt 284.4 lb

## 2013-07-20 DIAGNOSIS — M5430 Sciatica, unspecified side: Secondary | ICD-10-CM

## 2013-07-20 DIAGNOSIS — M5431 Sciatica, right side: Secondary | ICD-10-CM

## 2013-07-20 MED ORDER — PREDNISONE (PAK) 10 MG PO TABS: ORAL_TABLET | Freq: Every day | ORAL | Status: DC

## 2013-07-20 NOTE — Progress Notes (Signed)
Subjective:     Patient ID: Brian Roach, male   DOB: 22-Apr-1960, 53 y.o.   MRN: [id number]  HPI Pt with hx of discectomy x 2 > 10 yrs ago Multiple recent appts for LBP Pt placed on pain med and NSAIDS He was also started on PT Pt with sig increase in sx last pm with radiation to the R leg   Review of Systems + R leg radicular pain No change in bowel/bladder function Sx increase with change in position    Objective:   Physical Exam Obese Sitting comfortably Midline surg scar L-spine No ecchy edema + TTP L-spine R>L No palp muscle spasm Decrease in ROM due to sx SLR neg DTR 1+/= lower ext sensory intact Xray-Degen changes and loss of disc height    Assessment:     LBP with R radicular leg pain    Plan:     Hold Naproxen Sterapred dose pack- SE reviewed Heat/Ice Referral to Ortho

## 2013-07-23 ENCOUNTER — Other Ambulatory Visit: Payer: Self-pay | Admitting: Family Medicine

## 2013-07-24 ENCOUNTER — Other Ambulatory Visit: Payer: Self-pay | Admitting: Family Medicine

## 2013-07-25 ENCOUNTER — Encounter: Payer: BC Managed Care – PPO | Admitting: Physical Therapy

## 2013-07-25 ENCOUNTER — Other Ambulatory Visit: Payer: Self-pay | Admitting: Family Medicine

## 2013-07-25 DIAGNOSIS — M549 Dorsalgia, unspecified: Secondary | ICD-10-CM

## 2013-07-25 MED ORDER — HYDROCODONE-ACETAMINOPHEN 5-325 MG PO TABS: 1.0000 | ORAL_TABLET | Freq: Four times a day (QID) | ORAL | Status: DC | PRN

## 2013-09-10 DIAGNOSIS — Z029 Encounter for administrative examinations, unspecified: Secondary | ICD-10-CM

## 2013-10-01 ENCOUNTER — Other Ambulatory Visit: Payer: Self-pay | Admitting: Nurse Practitioner

## 2013-11-13 ENCOUNTER — Encounter: Payer: Self-pay | Admitting: Family

## 2013-11-13 ENCOUNTER — Ambulatory Visit (INDEPENDENT_AMBULATORY_CARE_PROVIDER_SITE_OTHER): Payer: BC Managed Care – PPO | Admitting: Family

## 2013-11-13 VITALS — BP 124/80 | HR 80 | Temp 97.5°F | Ht 70.0 in | Wt 298.0 lb

## 2013-11-13 DIAGNOSIS — L259 Unspecified contact dermatitis, unspecified cause: Secondary | ICD-10-CM

## 2013-11-13 MED ORDER — METHYLPREDNISOLONE (PAK) 4 MG PO TABS: ORAL_TABLET | ORAL | Status: DC

## 2013-11-13 MED ORDER — METHYLPREDNISOLONE ACETATE 80 MG/ML IJ SUSP
80.0000 mg | Freq: Once | INTRAMUSCULAR | Status: AC
  Administered 2013-11-13: 80 mg via INTRAMUSCULAR

## 2013-11-13 MED ORDER — TRIAMCINOLONE ACETONIDE 0.5 % EX OINT: 1.0000 "application " | TOPICAL_OINTMENT | Freq: Two times a day (BID) | CUTANEOUS | Status: DC

## 2013-11-13 NOTE — Progress Notes (Signed)
   Subjective:    Patient ID: Brian Roach, male    DOB: 03-26-1960, 53 y.o.   MRN: [id number]  Rash This is a new problem. The current episode started yesterday. The problem has been gradually worsening since onset. The affected locations include the back, left arm, left lower leg, left upper leg, right upper leg, right lower leg, right foot and abdomen. The rash is characterized by itchiness and redness. Pertinent negatives include no congestion, cough, diarrhea, fever, joint pain, rhinorrhea, sore throat or vomiting. Past treatments include anti-itch cream. The treatment provided mild relief.      Review of Systems  Constitutional: Negative.  Negative for fever.  HENT: Negative.  Negative for congestion, rhinorrhea and sore throat.   Respiratory: Negative.  Negative for cough.   Cardiovascular: Negative.   Gastrointestinal: Negative.  Negative for vomiting and diarrhea.  Endocrine: Negative.   Genitourinary: Negative.   Musculoskeletal: Negative.  Negative for joint pain.  Skin: Positive for rash.  Neurological: Negative.   Hematological: Negative.   Psychiatric/Behavioral: Negative.   All other systems reviewed and are negative.      Objective:   Physical Exam  Vitals reviewed. Constitutional: He is oriented to person, place, and time. He appears well-developed and well-nourished. No distress.  Cardiovascular: Normal rate, regular rhythm, normal heart sounds and intact distal pulses.   No murmur heard. Pulmonary/Chest: Effort normal and breath sounds normal. No respiratory distress. He has no wheezes.  Abdominal: Soft. Bowel sounds are normal. He exhibits no distension. There is no tenderness.  Musculoskeletal: Normal range of motion. He exhibits no edema and no tenderness.  Neurological: He is alert and oriented to person, place, and time. He has normal reflexes. No cranial nerve deficit.  Skin: Skin is warm and dry. Rash noted. No erythema.  Generalized erythemas papulas  on bilateral legs, back, left arm , and abdomen   Psychiatric: He has a normal mood and affect. His behavior is normal. Judgment and thought content normal.    BP 124/80  Pulse 80  Temp(Src) 97.5 F (36.4 C) (Oral)  Ht  (1.778 m)  Wt 298 lb (135.172 kg)  BMI 42.76 kg/m2       Assessment & Plan:  1. Contact dermatitis -Do not scratch -RTO prn - methylPREDNISolone acetate (DEPO-MEDROL) injection 80 mg; Inject 1 mL (80 mg total) into the muscle once. - methylPREDNIsolone (MEDROL DOSPACK) 4 MG tablet; follow package directions  Dispense: 21 tablet; Refill: 0 - triamcinolone ointment (KENALOG) 0.5 %; Apply 1 application topically 2 (two) times daily.  Dispense: 30 g; Refill: 1  Jannifer Rodney, FNP

## 2013-11-13 NOTE — Patient Instructions (Signed)

## 2013-11-15 ENCOUNTER — Other Ambulatory Visit: Payer: Self-pay | Admitting: Nurse Practitioner

## 2013-11-26 ENCOUNTER — Other Ambulatory Visit: Payer: Self-pay | Admitting: Nurse Practitioner

## 2013-12-14 ENCOUNTER — Other Ambulatory Visit: Payer: Self-pay

## 2014-01-28 ENCOUNTER — Ambulatory Visit (INDEPENDENT_AMBULATORY_CARE_PROVIDER_SITE_OTHER): Payer: BC Managed Care – PPO | Admitting: Family Medicine

## 2014-01-28 ENCOUNTER — Encounter: Payer: Self-pay | Admitting: Family Medicine

## 2014-01-28 VITALS — BP 112/66 | HR 91 | Temp 97.0°F | Ht 70.0 in | Wt 289.0 lb

## 2014-01-28 DIAGNOSIS — J0100 Acute maxillary sinusitis, unspecified: Secondary | ICD-10-CM

## 2014-01-28 MED ORDER — AMOXICILLIN 875 MG PO TABS: 875.0000 mg | ORAL_TABLET | Freq: Two times a day (BID) | ORAL | Status: DC

## 2014-01-28 MED ORDER — METHYLPREDNISOLONE ACETATE 80 MG/ML IJ SUSP
80.0000 mg | Freq: Once | INTRAMUSCULAR | Status: AC
  Administered 2014-01-28: 80 mg via INTRAMUSCULAR

## 2014-01-28 NOTE — Progress Notes (Signed)
Subjective:    Patient ID: Brian Roach, male    DOB: 07/10/1960, 53 y.o.   MRN: [id number]  HPI Patient is here c/o uri sx's and sinus headache.  Review of Systems  Constitutional: Negative for fever.  HENT: Negative for ear pain.   Eyes: Negative for discharge.  Respiratory: Negative for cough.   Cardiovascular: Negative for chest pain.  Gastrointestinal: Negative for abdominal distention.  Endocrine: Negative for polyuria.  Genitourinary: Negative for difficulty urinating.  Musculoskeletal: Negative for gait problem and neck pain.  Skin: Negative for color change and rash.  Neurological: Negative for speech difficulty and headaches.  Psychiatric/Behavioral: Negative for agitation.       Objective:    BP 112/66 mmHg  Pulse 91  Temp(Src) 97 F (36.1 C) (Oral)  Ht 5\' 10"  (1.778 m)  Wt 289 lb (131.09 kg)  BMI 41.47 kg/m2 Physical Exam  Constitutional: He is oriented to person, place, and time. He appears well-developed and well-nourished.  HENT:  Head: Normocephalic and atraumatic.  Mouth/Throat: Oropharynx is clear and moist.  Eyes: Pupils are equal, round, and reactive to light.  Neck: Normal range of motion. Neck supple.  Cardiovascular: Normal rate and regular rhythm.   No murmur heard. Pulmonary/Chest: Effort normal and breath sounds normal.  Abdominal: Soft. Bowel sounds are normal. There is no tenderness.  Neurological: He is alert and oriented to person, place, and time.  Skin: Skin is warm and dry.  Psychiatric: He has a normal mood and affect.          Assessment & Plan:     ICD-9-CM ICD-10-CM   1. Acute maxillary sinusitis, recurrence not specified 461.0 J01.00 methylPREDNISolone acetate (DEPO-MEDROL) injection 80 mg     amoxicillin (AMOXIL) 875 MG tablet     Return if symptoms worsen or fail to improve.  Deatra Canter FNP

## 2014-01-28 NOTE — Addendum Note (Signed)
Addended by: Bearl MulberryUTHERFORD, NATALIE K on: 01/28/2014 04:14 PM   Modules accepted: Kipp BroodSmartSet

## 2014-01-29 ENCOUNTER — Other Ambulatory Visit: Payer: Self-pay | Admitting: Family Medicine

## 2014-01-30 NOTE — Telephone Encounter (Signed)
Last labs 4/15. ntbs for further refills

## 2014-03-26 ENCOUNTER — Other Ambulatory Visit: Payer: Self-pay | Admitting: Nurse Practitioner

## 2014-03-27 NOTE — Telephone Encounter (Signed)
Last seen 01/28/14 B [place]  Last lipid 06/27/13

## 2014-04-08 ENCOUNTER — Ambulatory Visit (INDEPENDENT_AMBULATORY_CARE_PROVIDER_SITE_OTHER): Payer: BC Managed Care – PPO | Admitting: Family Medicine

## 2014-04-08 ENCOUNTER — Encounter: Payer: Self-pay | Admitting: Family Medicine

## 2014-04-08 VITALS — BP 126/86 | HR 77 | Temp 98.4°F | Ht 70.0 in | Wt 296.0 lb

## 2014-04-08 DIAGNOSIS — J019 Acute sinusitis, unspecified: Secondary | ICD-10-CM | POA: Insufficient documentation

## 2014-04-08 DIAGNOSIS — E7800 Pure hypercholesterolemia, unspecified: Secondary | ICD-10-CM

## 2014-04-08 DIAGNOSIS — J0101 Acute recurrent maxillary sinusitis: Secondary | ICD-10-CM

## 2014-04-08 DIAGNOSIS — I10 Essential (primary) hypertension: Secondary | ICD-10-CM

## 2014-04-08 MED ORDER — LEVOFLOXACIN 500 MG PO TABS: 500.0000 mg | ORAL_TABLET | Freq: Every day | ORAL | Status: DC

## 2014-04-08 NOTE — Progress Notes (Signed)
Subjective:  Patient ID: Brian Roach, male    DOB: 11-Feb-1961  Age: 54 y.o. MRN: [id number]  CC: Sinusitis   HPI MATTHEWS FRANKS presents for fever to 102. Chills. Some rhinorrhea. No sore throat otalgia or cough.Pressure behind eyes. Limited relief from amoxil recently.Onset 5 days ago.  Also follow up for hypertension & lipids  Patient in for follow-up of hypertension. Patient has no history of headache chest pain or shortness of breath or recent cough. Patient also denies symptoms of TIA such as numbness weakness lateralizing. Patient checks  blood pressure at home and has not had any elevated readings recently. Patient denies side effects from his medication. States taking it regularly.   Patient in for follow-up of elevated cholesterol. Doing well without complaints on current medication. Denies side effects of statin including myalgia and arthralgia and nausea. Also in today for liver function testing. Currently no chest pain, shortness of breath or other cardiovascular related symptoms noted.  History Kona has a past medical history of PE (pulmonary embolism); Hypertension; and Hyperlipidemia.   He has past surgical history that includes Back surgery; Knee surgery; Lung surgery; and Anterior cruciate ligament repair (Right).   His family history includes Heart disease in his father.He reports that he has never smoked. He has never used smokeless tobacco. He reports that he does not drink alcohol or use illicit drugs.  Current Outpatient Prescriptions on File Prior to Visit  Medication Sig Dispense Refill  . aspirin EC 81 MG EC tablet Take 1 tablet (81 mg total) by mouth daily.    . benazepril (LOTENSIN) 20 MG tablet TAKE 1 TABLET (20 MG TOTAL) BY MOUTH DAILY. 90 tablet 0  . diltiazem (CARDIZEM CD) 300 MG 24 hr capsule TAKE 1 CAPSULE (300 MG TOTAL) BY MOUTH DAILY. 90 capsule 1  . atorvastatin (LIPITOR) 20 MG tablet TAKE 1 TABLET BY MOUTH DAILY 30 tablet 0   No current  facility-administered medications on file prior to visit.    ROS Review of Systems  Constitutional: Negative for fever, chills, diaphoresis and unexpected weight change.  HENT: Negative for congestion, hearing loss, rhinorrhea, sore throat and trouble swallowing.   Respiratory: Negative for cough, chest tightness, shortness of breath and wheezing.   Gastrointestinal: Negative for nausea, vomiting, abdominal pain, diarrhea, constipation and abdominal distention.  Endocrine: Negative for cold intolerance and heat intolerance.  Genitourinary: Negative for dysuria, hematuria and flank pain.  Musculoskeletal: Negative for joint swelling and arthralgias.  Skin: Negative for rash.  Neurological: Negative for dizziness and headaches.  Psychiatric/Behavioral: Negative for dysphoric mood, decreased concentration and agitation. The patient is not nervous/anxious.     Objective:  BP 126/86 mmHg  Pulse 77  Temp(Src) 98.4 F (36.9 C) (Oral)  Ht _0  (1.778 m)  Wt 296 lb (134.265 kg)  BMI 42.47 kg/m2  Physical Exam  Constitutional: He is oriented to person, place, and time. He appears well-developed and well-nourished. No distress.  HENT:  Head: Normocephalic and atraumatic.  Right Ear: External ear normal.  Left Ear: External ear normal.  Nose: Nose normal.  Mouth/Throat: Oropharynx is clear and moist.  Eyes: Conjunctivae and EOM are normal. Pupils are equal, round, and reactive to light.  Neck: Normal range of motion. Neck supple. No thyromegaly present.  Cardiovascular: Normal rate, regular rhythm and normal heart sounds.   No murmur heard. Pulmonary/Chest: Effort normal and breath sounds normal. No respiratory distress. He has no wheezes. He has no rales.  Abdominal: Soft. Bowel sounds  are normal. He exhibits no distension. There is no tenderness.  Lymphadenopathy:    He has no cervical adenopathy.  Neurological: He is alert and oriented to person, place, and time. He has normal  reflexes.  Skin: Skin is warm and dry.  Psychiatric: He has a normal mood and affect. His behavior is normal. Judgment and thought content normal.    Assessment & Plan:   Andruw was seen today for sinusitis.  Diagnoses and associated orders for this visit:  Hypercholesteremia - Lipid panel - CMP14+EGFR  HTN (hypertension), benign - CMP14+EGFR  Acute recurrent maxillary sinusitis  Other Orders - levofloxacin (LEVAQUIN) 500 MG tablet; Take 1 tablet (500 mg total) by mouth daily.    I have discontinued Mr. Carnathan's amoxicillin. I am also having him start on levofloxacin. Additionally, I am having him maintain his aspirin, diltiazem, benazepril, and atorvastatin.  Meds ordered this encounter  Medications  . levofloxacin (LEVAQUIN) 500 MG tablet    Sig: Take 1 tablet (500 mg total) by mouth daily.    Dispense:  10 tablet    Refill:  0    Follow-up: Return in about 6 months (around 10/07/2014) for hypertension, cholesterol.  Claretta Fraise, M.D.

## 2014-04-09 LAB — CMP14+EGFR
ALBUMIN: 4.4 g/dL (ref 3.5–5.5)
ALT: 50 IU/L — ABNORMAL HIGH (ref 0–44)
AST: 28 IU/L (ref 0–40)
Albumin/Globulin Ratio: 1.7 (ref 1.1–2.5)
Alkaline Phosphatase: 84 IU/L (ref 39–117)
BUN / CREAT RATIO: 18 (ref 9–20)
BUN: 17 mg/dL (ref 6–24)
Bilirubin Total: 0.4 mg/dL (ref 0.0–1.2)
CALCIUM: 9.8 mg/dL (ref 8.7–10.2)
CO2: 26 mmol/L (ref 18–29)
Chloride: 101 mmol/L (ref 97–108)
Creatinine, Ser: 0.94 mg/dL (ref 0.76–1.27)
GFR calc Af Amer: 105 mL/min/{1.73_m2} (ref >59)
GFR, EST NON AFRICAN AMERICAN: 91 mL/min/{1.73_m2} (ref >59)
Globulin, Total: 2.6 g/dL (ref 1.5–4.5)
Glucose: 96 mg/dL (ref 65–99)
Potassium: 5 mmol/L (ref 3.5–5.2)
Sodium: 142 mmol/L (ref 134–144)
Total Protein: 7 g/dL (ref 6.0–8.5)

## 2014-04-09 LAB — LIPID PANEL
CHOLESTEROL TOTAL: 122 mg/dL (ref 100–199)
Chol/HDL Ratio: 3.2 ratio units (ref 0.0–5.0)
HDL: 38 mg/dL — AB (ref >39)
LDL Calculated: 74 mg/dL (ref 0–99)
Triglycerides: 50 mg/dL (ref 0–149)
VLDL CHOLESTEROL CAL: 10 mg/dL (ref 5–40)

## 2014-04-23 ENCOUNTER — Other Ambulatory Visit: Payer: Self-pay | Admitting: Family Medicine

## 2014-04-24 ENCOUNTER — Other Ambulatory Visit: Payer: Self-pay | Admitting: Family Medicine

## 2014-05-27 ENCOUNTER — Other Ambulatory Visit: Payer: Self-pay | Admitting: Nurse Practitioner

## 2014-11-15 ENCOUNTER — Other Ambulatory Visit: Payer: Self-pay | Admitting: Family Medicine

## 2014-12-19 ENCOUNTER — Ambulatory Visit (INDEPENDENT_AMBULATORY_CARE_PROVIDER_SITE_OTHER): Payer: BC Managed Care – PPO | Admitting: Nurse Practitioner

## 2014-12-19 ENCOUNTER — Encounter: Payer: Self-pay | Admitting: Nurse Practitioner

## 2014-12-19 VITALS — BP 135/81 | HR 78 | Temp 98.9°F | Ht 70.0 in | Wt 301.0 lb

## 2014-12-19 DIAGNOSIS — J0100 Acute maxillary sinusitis, unspecified: Secondary | ICD-10-CM

## 2014-12-19 DIAGNOSIS — J209 Acute bronchitis, unspecified: Secondary | ICD-10-CM | POA: Diagnosis not present

## 2014-12-19 MED ORDER — LEVOFLOXACIN 500 MG PO TABS: 500.0000 mg | ORAL_TABLET | Freq: Every day | ORAL | Status: DC

## 2014-12-19 MED ORDER — BENZONATATE 100 MG PO CAPS: 100.0000 mg | ORAL_CAPSULE | Freq: Three times a day (TID) | ORAL | Status: DC | PRN

## 2014-12-19 NOTE — Progress Notes (Signed)
  Subjective:     Vernia BuffWilliam F Miah is a 54 y.o. male who presents for evaluation of sinus pain. Symptoms include: congestion, cough, facial pain, fevers, headaches, post nasal drip, sinus pressure, sniffing and sore throat. Onset of symptoms was 7 days ago. Symptoms have been gradually worsening since that time. Past history is significant for no history of pneumonia or bronchitis. Patient is a non-smoker. Had some amoxicillin at home and has taken for 5 days with no change.  The following portions of the patient's history were reviewed and updated as appropriate: allergies, current medications, past family history, past medical history, past social history, past surgical history and problem list.  Review of Systems Pertinent items are noted in HPI.   Objective:    BP 135/81 mmHg  Pulse 78  Temp(Src) 98.9 F (37.2 C) (Oral)  Ht 5\' 10"  (1.778 m)  Wt 301 lb (136.533 kg)  BMI 43.19 kg/m2 General appearance: alert and cooperative Eyes: conjunctivae/corneas clear. PERRL, EOM's intact. Fundi benign. Ears: normal TM's and external ear canals both ears Nose: clear discharge, moderate congestion, turbinates red, sinus tenderness bilateral Throat: lips, mucosa, and tongue normal; teeth and gums normal Neck: no adenopathy, no carotid bruit, no JVD, supple, symmetrical, trachea midline and thyroid not enlarged, symmetric, no tenderness/mass/nodules Lungs: clear to auscultation bilaterally and dry hacky cough Heart: regular rate and rhythm, S1, S2 normal, no murmur, click, rub or gallop    Assessment:    Acute bacterial sinusitis.  Bronchitis    Plan:  1. Take meds as prescribed 2. Use a cool mist humidifier especially during the winter months and when heat has been humid. 3. Use saline nose sprays frequently 4. Saline irrigations of the nose can be very helpful if done frequently.  * 4X daily for 1 week*  * Use of a nettie pot can be helpful with this. Follow directions with this* 5. Drink  plenty of fluids 6. Keep thermostat turn down low 7.For any cough or congestion  Use plain Mucinex- regular strength or max strength is fine   * Children- consult with Pharmacist for dosing 8. For fever or aces or pains- take tylenol or ibuprofen appropriate for age and weight.  * for fevers greater than 101 orally you may alternate ibuprofen and tylenol every  3 hours.   Meds ordered this encounter  Medications  . levofloxacin (LEVAQUIN) 500 MG tablet    Sig: Take 1 tablet (500 mg total) by mouth daily.    Dispense:  10 tablet    Refill:  0    Order Specific Question:  Supervising Provider    Answer:  Ernestina PennaMOORE, DONALD W [[id number]]  . benzonatate (TESSALON PERLES) 100 MG capsule    Sig: Take 1 capsule (100 mg total) by mouth 3 (three) times daily as needed for cough.    Dispense:  20 capsule    Refill:  0    Order Specific Question:  Supervising Provider    Answer:  Ernestina PennaMOORE, DONALD W [[id number]]   Mary-Margaret Daphine DeutscherMartin, FNP

## 2014-12-19 NOTE — Patient Instructions (Signed)

## 2014-12-31 ENCOUNTER — Other Ambulatory Visit: Payer: Self-pay | Admitting: Family Medicine

## 2014-12-31 NOTE — Telephone Encounter (Signed)
Last seen 04/08/14

## 2015-01-11 ENCOUNTER — Encounter: Payer: Self-pay | Admitting: Family

## 2015-01-11 ENCOUNTER — Ambulatory Visit (INDEPENDENT_AMBULATORY_CARE_PROVIDER_SITE_OTHER): Payer: BC Managed Care – PPO | Admitting: Family

## 2015-01-11 VITALS — BP 127/79 | HR 78 | Temp 97.9°F | Ht 70.0 in | Wt 303.0 lb

## 2015-01-11 DIAGNOSIS — J309 Allergic rhinitis, unspecified: Secondary | ICD-10-CM | POA: Diagnosis not present

## 2015-01-11 DIAGNOSIS — J069 Acute upper respiratory infection, unspecified: Secondary | ICD-10-CM | POA: Diagnosis not present

## 2015-01-11 MED ORDER — FLUTICASONE PROPIONATE 50 MCG/ACT NA SUSP: 2.0000 | Freq: Every day | NASAL | Status: DC

## 2015-01-11 MED ORDER — HYDROCODONE-HOMATROPINE 5-1.5 MG/5ML PO SYRP: 5.0000 mL | ORAL_SOLUTION | Freq: Three times a day (TID) | ORAL | Status: DC | PRN

## 2015-01-11 MED ORDER — METHYLPREDNISOLONE 4 MG PO TBPK: ORAL_TABLET | ORAL | Status: DC

## 2015-01-11 MED ORDER — BENZONATATE 100 MG PO CAPS: 100.0000 mg | ORAL_CAPSULE | Freq: Three times a day (TID) | ORAL | Status: DC | PRN

## 2015-01-11 NOTE — Progress Notes (Signed)
Subjective:    Patient ID: Brian Roach, male    DOB: 01/20/1961, 54 y.o.   MRN: [id number]  Pt presents to the office today for recurrent cough. Pt was seen in the office on 12/19/14 and started on levaquin and tessalon pearls. Pt states his cough did not improve.  Cough This is a recurrent problem. The current episode started 1 to 4 weeks ago. The problem has been unchanged. The problem occurs every few minutes. The cough is productive of sputum. Associated symptoms include chills, headaches, nasal congestion, rhinorrhea, a sore throat, shortness of breath and wheezing. Pertinent negatives include no ear congestion, ear pain, fever or postnasal drip. The symptoms are aggravated by lying down. He has tried rest (Levaquin and tessalon pearls) for the symptoms. The treatment provided mild relief. There is no history of asthma, COPD or environmental allergies.      Review of Systems  Constitutional: Positive for chills. Negative for fever.  HENT: Positive for rhinorrhea and sore throat. Negative for ear pain and postnasal drip.   Respiratory: Positive for cough, shortness of breath and wheezing.   Cardiovascular: Negative.   Gastrointestinal: Negative.   Endocrine: Negative.   Genitourinary: Negative.   Musculoskeletal: Negative.   Allergic/Immunologic: Negative for environmental allergies.  Neurological: Positive for headaches.  Hematological: Negative.   Psychiatric/Behavioral: Negative.   All other systems reviewed and are negative.      Objective:   Physical Exam  Constitutional: He is oriented to person, place, and time. He appears well-developed and well-nourished. No distress.  HENT:  Head: Normocephalic.  Right Ear: External ear normal.  Left Ear: External ear normal.  Nasal passage erythemas with mild swelling  Oropharynx erythemas   Eyes: Pupils are equal, round, and reactive to light. Right eye exhibits no discharge. Left eye exhibits no discharge.  Neck: Normal  range of motion. Neck supple. No thyromegaly present.  Cardiovascular: Normal rate, regular rhythm, normal heart sounds and intact distal pulses.   No murmur heard. Pulmonary/Chest: Effort normal and breath sounds normal. No respiratory distress. He has no wheezes.  Abdominal: Soft. Bowel sounds are normal. He exhibits no distension. There is no tenderness.  Musculoskeletal: Normal range of motion. He exhibits no edema or tenderness.  Neurological: He is alert and oriented to person, place, and time. He has normal reflexes. No cranial nerve deficit.  Skin: Skin is warm and dry. No rash noted. No erythema.  Psychiatric: He has a normal mood and affect. His behavior is normal. Judgment and thought content normal.  Vitals reviewed.   BP 127/79 mmHg  Pulse 78  Temp(Src) 97.9 F (36.6 C) (Oral)  Ht 5\' 10"  (1.778 m)  Wt 303 lb (137.44 kg)  BMI 43.48 kg/m2       Assessment & Plan:  1. Allergic rhinitis, unspecified allergic rhinitis type -Avoid allergens when possible - fluticasone (FLONASE) 50 MCG/ACT nasal spray; Place 2 sprays into both nostrils daily.  Dispense: 16 g; Refill: 6 - methylPREDNISolone (MEDROL DOSEPAK) 4 MG TBPK tablet; Use as directed  Dispense: 21 tablet; Refill: 0  2. Acute upper respiratory infection -- Take meds as prescribed - Use a cool mist humidifier  -Use saline nose sprays frequently -Saline irrigations of the nose can be very helpful if done frequently.  * 4X daily for 1 week*  * Use of a nettie pot can be helpful with this. Follow directions with this* -Force fluids -For any cough or congestion  Use plain Mucinex- regular strength or max strength  is fine   * Children- consult with Pharmacist for dosing -For fever or aces or pains- take tylenol or ibuprofen appropriate for age and weight.  * for fevers greater than 101 orally you may alternate ibuprofen and tylenol every  3 hours. -Throat lozenges if help - HYDROcodone-homatropine (HYCODAN) 5-1.5  MG/5ML syrup; Take 5 mLs by mouth every 8 (eight) hours as needed for cough.  Dispense: 120 mL; Refill: 0 - methylPREDNISolone (MEDROL DOSEPAK) 4 MG TBPK tablet; Use as directed  Dispense: 21 tablet; Refill: 0  Jannifer Rodney, FNP

## 2015-01-11 NOTE — Patient Instructions (Signed)
Upper Respiratory Infection, Adult Most upper respiratory infections (URIs) are a viral infection of the air passages leading to the lungs. A URI affects the nose, throat, and upper air passages. The most common type of URI is nasopharyngitis and is typically referred to as "the common cold." URIs run their course and usually go away on their own. Most of the time, a URI does not require medical attention, but sometimes a bacterial infection in the upper airways can follow a viral infection. This is called a secondary infection. Sinus and middle ear infections are common types of secondary upper respiratory infections. Bacterial pneumonia can also complicate a URI. A URI can worsen asthma and chronic obstructive pulmonary disease (COPD). Sometimes, these complications can require emergency medical care and may be life threatening.  CAUSES Almost all URIs are caused by viruses. A virus is a type of germ and can spread from one person to another.  RISKS FACTORS You may be at risk for a URI if:   You smoke.   You have chronic heart or lung disease.  You have a weakened defense (immune) system.   You are very young or very old.   You have nasal allergies or asthma.  You work in crowded or poorly ventilated areas.  You work in health care facilities or schools. SIGNS AND SYMPTOMS  Symptoms typically develop 2-3 days after you come in contact with a cold virus. Most viral URIs last 7-10 days. However, viral URIs from the influenza virus (flu virus) can last 14-18 days and are typically more severe. Symptoms may include:   Runny or stuffy (congested) nose.   Sneezing.   Cough.   Sore throat.   Headache.   Fatigue.   Fever.   Loss of appetite.   Pain in your forehead, behind your eyes, and over your cheekbones (sinus pain).  Muscle aches.  DIAGNOSIS  Your health care provider may diagnose a URI by:  Physical exam.  Tests to check that your symptoms are not due to  another condition such as:  Strep throat.  Sinusitis.  Pneumonia.  Asthma. TREATMENT  A URI goes away on its own with time. It cannot be cured with medicines, but medicines may be prescribed or recommended to relieve symptoms. Medicines may help:  Reduce your fever.  Reduce your cough.  Relieve nasal congestion. HOME CARE INSTRUCTIONS   Take medicines only as directed by your health care provider.   Gargle warm saltwater or take cough drops to comfort your throat as directed by your health care provider.  Use a warm mist humidifier or inhale steam from a shower to increase air moisture. This may make it easier to breathe.  Drink enough fluid to keep your urine clear or pale yellow.   Eat soups and other clear broths and maintain good nutrition.   Rest as needed.   Return to work when your temperature has returned to normal or as your health care provider advises. You may need to stay home longer to avoid infecting others. You can also use a face mask and careful hand washing to prevent spread of the virus.  Increase the usage of your inhaler if you have asthma.   Do not use any tobacco products, including cigarettes, chewing tobacco, or electronic cigarettes. If you need help quitting, ask your health care provider. PREVENTION  The best way to protect yourself from getting a cold is to practice good hygiene.   Avoid oral or hand contact with people with cold   symptoms.   Wash your hands often if contact occurs.  There is no clear evidence that vitamin C, vitamin E, echinacea, or exercise reduces the chance of developing a cold. However, it is always recommended to get plenty of rest, exercise, and practice good nutrition.  SEEK MEDICAL CARE IF:   You are getting worse rather than better.   Your symptoms are not controlled by medicine.   You have chills.  You have worsening shortness of breath.  You have brown or red mucus.  You have yellow or brown nasal  discharge.  You have pain in your face, especially when you bend forward.  You have a fever.  You have swollen neck glands.  You have pain while swallowing.  You have white areas in the back of your throat. SEEK IMMEDIATE MEDICAL CARE IF:   You have severe or persistent:  Headache.  Ear pain.  Sinus pain.  Chest pain.  You have chronic lung disease and any of the following:  Wheezing.  Prolonged cough.  Coughing up blood.  A change in your usual mucus.  You have a stiff neck.  You have changes in your:  Vision.  Hearing.  Thinking.  Mood. MAKE SURE YOU:   Understand these instructions.  Will watch your condition.  Will get help right away if you are not doing well or get worse.   This information is not intended to replace advice given to you by your health care provider. Make sure you discuss any questions you have with your health care provider.   Document Released: 08/11/2000 Document Revised: 07/02/2014 Document Reviewed: 05/23/2013 Elsevier Interactive Patient Education 2016 [employer]  - Take meds as prescribed - Use a cool mist humidifier  -Use saline nose sprays frequently -Saline irrigations of the nose can be very helpful if done frequently.  * 4X daily for 1 week*  * Use of a nettie pot can be helpful with this. Follow directions with this* -Force fluids -For any cough or congestion  Use plain Mucinex- regular strength or max strength is fine   * Children- consult with Pharmacist for dosing -For fever or aces or pains- take tylenol or ibuprofen appropriate for age and weight.  * for fevers greater than 101 orally you may alternate ibuprofen and tylenol every  3 hours. -Throat lozenges if help   Christy Hawks, FNP   

## 2015-02-04 ENCOUNTER — Other Ambulatory Visit: Payer: BC Managed Care – PPO

## 2015-02-04 ENCOUNTER — Ambulatory Visit (INDEPENDENT_AMBULATORY_CARE_PROVIDER_SITE_OTHER): Payer: BC Managed Care – PPO

## 2015-02-04 DIAGNOSIS — Z23 Encounter for immunization: Secondary | ICD-10-CM

## 2015-02-04 DIAGNOSIS — R5383 Other fatigue: Secondary | ICD-10-CM

## 2015-02-04 DIAGNOSIS — E7800 Pure hypercholesterolemia, unspecified: Secondary | ICD-10-CM

## 2015-02-04 DIAGNOSIS — I10 Essential (primary) hypertension: Secondary | ICD-10-CM

## 2015-02-05 LAB — LIPID PANEL
CHOLESTEROL TOTAL: 138 mg/dL (ref 100–199)
Chol/HDL Ratio: 3.5 ratio units (ref 0.0–5.0)
HDL: 40 mg/dL (ref >39)
LDL Calculated: 78 mg/dL (ref 0–99)
Triglycerides: 101 mg/dL (ref 0–149)
VLDL CHOLESTEROL CAL: 20 mg/dL (ref 5–40)

## 2015-02-05 LAB — CMP14+EGFR
A/G RATIO: 1.7 (ref 1.1–2.5)
ALBUMIN: 4.5 g/dL (ref 3.5–5.5)
ALK PHOS: 69 IU/L (ref 39–117)
ALT: 50 IU/L — ABNORMAL HIGH (ref 0–44)
AST: 42 IU/L — ABNORMAL HIGH (ref 0–40)
BILIRUBIN TOTAL: 0.6 mg/dL (ref 0.0–1.2)
BUN / CREAT RATIO: 12 (ref 9–20)
BUN: 10 mg/dL (ref 6–24)
CHLORIDE: 99 mmol/L (ref 97–106)
CO2: 27 mmol/L (ref 18–29)
Calcium: 9.5 mg/dL (ref 8.7–10.2)
Creatinine, Ser: 0.84 mg/dL (ref 0.76–1.27)
GFR calc non Af Amer: 98 mL/min/{1.73_m2} (ref >59)
GFR, EST AFRICAN AMERICAN: 113 mL/min/{1.73_m2} (ref >59)
GLOBULIN, TOTAL: 2.7 g/dL (ref 1.5–4.5)
Glucose: 103 mg/dL — ABNORMAL HIGH (ref 65–99)
POTASSIUM: 5 mmol/L (ref 3.5–5.2)
SODIUM: 139 mmol/L (ref 136–144)
TOTAL PROTEIN: 7.2 g/dL (ref 6.0–8.5)

## 2015-02-05 LAB — VITAMIN D 25 HYDROXY (VIT D DEFICIENCY, FRACTURES): VIT D 25 HYDROXY: 34.5 ng/mL (ref 30.0–100.0)

## 2015-02-19 ENCOUNTER — Encounter: Payer: Self-pay | Admitting: Family

## 2015-02-19 ENCOUNTER — Ambulatory Visit (INDEPENDENT_AMBULATORY_CARE_PROVIDER_SITE_OTHER): Payer: BC Managed Care – PPO | Admitting: Family

## 2015-02-19 VITALS — BP 137/81 | HR 71 | Temp 97.5°F | Ht 70.0 in | Wt 301.6 lb

## 2015-02-19 DIAGNOSIS — I10 Essential (primary) hypertension: Secondary | ICD-10-CM

## 2015-02-19 DIAGNOSIS — E669 Obesity, unspecified: Secondary | ICD-10-CM | POA: Diagnosis not present

## 2015-02-19 DIAGNOSIS — J309 Allergic rhinitis, unspecified: Secondary | ICD-10-CM

## 2015-02-19 DIAGNOSIS — M2550 Pain in unspecified joint: Secondary | ICD-10-CM

## 2015-02-19 DIAGNOSIS — K580 Irritable bowel syndrome with diarrhea: Secondary | ICD-10-CM

## 2015-02-19 DIAGNOSIS — E7800 Pure hypercholesterolemia, unspecified: Secondary | ICD-10-CM | POA: Diagnosis not present

## 2015-02-19 DIAGNOSIS — K589 Irritable bowel syndrome without diarrhea: Secondary | ICD-10-CM | POA: Insufficient documentation

## 2015-02-19 DIAGNOSIS — Z1159 Encounter for screening for other viral diseases: Secondary | ICD-10-CM | POA: Diagnosis not present

## 2015-02-19 DIAGNOSIS — Z125 Encounter for screening for malignant neoplasm of prostate: Secondary | ICD-10-CM

## 2015-02-19 DIAGNOSIS — W57XXXA Bitten or stung by nonvenomous insect and other nonvenomous arthropods, initial encounter: Secondary | ICD-10-CM

## 2015-02-19 DIAGNOSIS — Z1211 Encounter for screening for malignant neoplasm of colon: Secondary | ICD-10-CM | POA: Diagnosis not present

## 2015-02-19 DIAGNOSIS — J0110 Acute frontal sinusitis, unspecified: Secondary | ICD-10-CM | POA: Diagnosis not present

## 2015-02-19 MED ORDER — AMOXICILLIN-POT CLAVULANATE 875-125 MG PO TABS: 1.0000 | ORAL_TABLET | Freq: Two times a day (BID) | ORAL | Status: DC

## 2015-02-19 MED ORDER — DICYCLOMINE HCL 20 MG PO TABS: 20.0000 mg | ORAL_TABLET | Freq: Four times a day (QID) | ORAL | Status: DC

## 2015-02-19 NOTE — Progress Notes (Signed)
Subjective:    Patient ID: Brian Roach, male    DOB: September 11, 1960, 54 y.o.   MRN: [id number]  PT presents to the office today for chronic follow up. PT continues to have sinus problems today. Pt was seen in the office on 01/11/15 and was given steroids and cough medication.  Hypertension This is a chronic problem. The current episode started more than 1 year ago. The problem has been resolved since onset. The problem is controlled. Associated symptoms include headaches ("sinus"). Pertinent negatives include no anxiety, palpitations, peripheral edema or shortness of breath. Risk factors for coronary artery disease include dyslipidemia, male gender, obesity, sedentary lifestyle and family history. Past treatments include ACE inhibitors and calcium channel blockers. The current treatment provides moderate improvement. There is no history of kidney disease, CAD/MI, CVA, heart failure or a thyroid problem. There is no history of sleep apnea.  Hyperlipidemia This is a chronic problem. The current episode started more than 1 year ago. The problem is controlled. Recent lipid tests were reviewed and are normal. He has no history of diabetes. Pertinent negatives include no shortness of breath. Current antihyperlipidemic treatment includes statins. The current treatment provides moderate improvement of lipids. Risk factors for coronary artery disease include dyslipidemia, family history, hypertension, male sex, obesity and a sedentary lifestyle.  Sinus Problem This is a new problem. The current episode started 1 to 4 weeks ago. The problem has been waxing and waning since onset. His pain is at a severity of 3/10. The pain is moderate. Associated symptoms include congestion, coughing, ear pain, headaches ("sinus"), a hoarse voice, sinus pressure and sneezing. Pertinent negatives include no shortness of breath or sore throat. Past treatments include oral decongestants. The treatment provided mild relief.       Review of Systems  Constitutional: Negative.   HENT: Positive for congestion, ear pain, hoarse voice, sinus pressure and sneezing. Negative for sore throat.   Respiratory: Positive for cough. Negative for shortness of breath.   Cardiovascular: Negative.  Negative for palpitations.  Gastrointestinal: Negative.   Endocrine: Negative.   Genitourinary: Negative.   Musculoskeletal: Negative.   Neurological: Positive for headaches ("sinus").  Hematological: Negative.   Psychiatric/Behavioral: Negative.   All other systems reviewed and are negative.      Objective:   Physical Exam  Constitutional: He is oriented to person, place, and time. He appears well-developed and well-nourished. No distress.  HENT:  Head: Normocephalic.  Right Ear: External ear normal.  Left Ear: External ear normal.  Nose: Nose normal.  Mouth/Throat: Oropharynx is clear and moist.  Eyes: Pupils are equal, round, and reactive to light. Right eye exhibits no discharge. Left eye exhibits no discharge.  Neck: Normal range of motion. Neck supple. No thyromegaly present.  Cardiovascular: Normal rate, regular rhythm, normal heart sounds and intact distal pulses.   No murmur heard. Pulmonary/Chest: Effort normal and breath sounds normal. No respiratory distress. He has no wheezes.  Abdominal: Soft. Bowel sounds are normal. He exhibits no distension. There is no tenderness.  Musculoskeletal: Normal range of motion. He exhibits no edema or tenderness.  Neurological: He is alert and oriented to person, place, and time. He has normal reflexes. No cranial nerve deficit.  Skin: Skin is warm and dry. No rash noted. No erythema.  Psychiatric: He has a normal mood and affect. His behavior is normal. Judgment and thought content normal.  Vitals reviewed.     BP 137/81 mmHg  Pulse 71  Temp(Src) 97.5 F (36.4 C) (Oral)  Ht 5' 10" (1.778 m)  Wt 301 lb 9.6 oz (136.805 kg)  BMI 43.28 kg/m2 '    Assessment & Plan:   1. HTN (hypertension), benign - CMP14+EGFR  2. Obesity - CMP14+EGFR  3. Hypercholesteremia  - CMP14+EGFR - Lipid panel  4. Allergic rhinitis, unspecified allergic rhinitis type  - CMP14+EGFR  5. Joint pain  - CMP14+EGFR - Lyme Ab/Western Blot Reflex  6. Tick bite  - CMP14+EGFR - Lyme Ab/Western Blot Reflex  7. Colon cancer screening - CMP14+EGFR - Fecal occult blood, imunochemical; Future  8. Need for hepatitis C screening test  - CMP14+EGFR - Hepatitis C antibody  9. Prostate cancer screening  - CMP14+EGFR - PSA, total and free  10. Irritable bowel syndrome with diarrhea -Pt started on Bentyl today - dicyclomine (BENTYL) 20 MG tablet; Take 1 tablet (20 mg total) by mouth every 6 (six) hours.  Dispense: 90 tablet; Refill: 1  11. Acute frontal sinusitis, recurrence not specified - Take meds as prescribed - Use a cool mist humidifier  -Use saline nose sprays frequently -Saline irrigations of the nose can be very helpful if done frequently.  * 4X daily for 1 week*  * Use of a nettie pot can be helpful with this. Follow directions with this* -Force fluids -For any cough or congestion  Use plain Mucinex- regular strength or max strength is fine   * Children- consult with Pharmacist for dosing -For fever or aces or pains- take tylenol or ibuprofen appropriate for age and weight.  * for fevers greater than 101 orally you may alternate ibuprofen and tylenol every  3 hours. -Throat lozenges if help - amoxicillin-clavulanate (AUGMENTIN) 875-125 MG tablet; Take 1 tablet by mouth 2 (two) times daily.  Dispense: 14 tablet; Refill: 0   Continue all meds Labs pending Health Maintenance reviewed Diet and exercise encouraged RTO 6 months  Evelina Dun, FNP

## 2015-02-19 NOTE — Patient Instructions (Signed)

## 2015-02-20 LAB — CMP14+EGFR
ALBUMIN: 4.4 g/dL (ref 3.5–5.5)
ALK PHOS: 70 IU/L (ref 39–117)
ALT: 59 IU/L — ABNORMAL HIGH (ref 0–44)
AST: 41 IU/L — ABNORMAL HIGH (ref 0–40)
Albumin/Globulin Ratio: 1.6 (ref 1.1–2.5)
BUN / CREAT RATIO: 15 (ref 9–20)
BUN: 14 mg/dL (ref 6–24)
Bilirubin Total: 0.7 mg/dL (ref 0.0–1.2)
CO2: 24 mmol/L (ref 18–29)
CREATININE: 0.95 mg/dL (ref 0.76–1.27)
Calcium: 9.9 mg/dL (ref 8.7–10.2)
Chloride: 97 mmol/L (ref 96–106)
GFR calc Af Amer: 103 mL/min/{1.73_m2} (ref >59)
GFR calc non Af Amer: 89 mL/min/{1.73_m2} (ref >59)
GLUCOSE: 106 mg/dL — AB (ref 65–99)
Globulin, Total: 2.7 g/dL (ref 1.5–4.5)
Potassium: 4.8 mmol/L (ref 3.5–5.2)
Sodium: 139 mmol/L (ref 134–144)
TOTAL PROTEIN: 7.1 g/dL (ref 6.0–8.5)

## 2015-02-20 LAB — PSA, TOTAL AND FREE
PSA FREE: 0.28 ng/mL
PSA, Free Pct: 28 %
Prostate Specific Ag, Serum: 1 ng/mL (ref 0.0–4.0)

## 2015-02-20 LAB — LYME AB/WESTERN BLOT REFLEX
LYME DISEASE AB, QUANT, IGM: <0.8 index (ref 0.00–0.79)
Lyme IgG/IgM Ab: <0.91 {ISR} (ref 0.00–0.90)

## 2015-02-20 LAB — LIPID PANEL
CHOLESTEROL TOTAL: 189 mg/dL (ref 100–199)
Chol/HDL Ratio: 5.3 ratio units — ABNORMAL HIGH (ref 0.0–5.0)
HDL: 36 mg/dL — AB (ref >39)
LDL CALC: 126 mg/dL — AB (ref 0–99)
TRIGLYCERIDES: 133 mg/dL (ref 0–149)
VLDL CHOLESTEROL CAL: 27 mg/dL (ref 5–40)

## 2015-02-20 LAB — HEPATITIS C ANTIBODY

## 2015-02-21 ENCOUNTER — Telehealth: Payer: Self-pay | Admitting: Family

## 2015-02-21 ENCOUNTER — Other Ambulatory Visit: Payer: Self-pay | Admitting: Family

## 2015-02-21 NOTE — Telephone Encounter (Signed)
Pt aware of results 

## 2015-03-03 ENCOUNTER — Other Ambulatory Visit: Payer: Self-pay | Admitting: Family Medicine

## 2015-04-01 ENCOUNTER — Other Ambulatory Visit: Payer: Self-pay | Admitting: Family Medicine

## 2015-04-14 ENCOUNTER — Other Ambulatory Visit: Payer: Self-pay | Admitting: Family Medicine

## 2015-05-02 ENCOUNTER — Other Ambulatory Visit: Payer: Self-pay | Admitting: Family

## 2015-08-04 ENCOUNTER — Other Ambulatory Visit: Payer: Self-pay | Admitting: Family

## 2015-08-04 NOTE — Telephone Encounter (Signed)
Last seen 02/19/15  Neysa BonitoChristy  Requesting a 90 day supply

## 2015-08-20 ENCOUNTER — Other Ambulatory Visit: Payer: Self-pay

## 2015-08-20 NOTE — Telephone Encounter (Signed)
Last seen and last lipid 02/19/15  Brian Roach  Requesting 90 day supply

## 2015-08-21 MED ORDER — ATORVASTATIN CALCIUM 20 MG PO TABS: 20.0000 mg | ORAL_TABLET | Freq: Every day | ORAL | Status: DC

## 2015-08-31 ENCOUNTER — Other Ambulatory Visit: Payer: Self-pay | Admitting: Family

## 2015-09-04 ENCOUNTER — Encounter: Payer: Self-pay | Admitting: Family

## 2015-09-04 ENCOUNTER — Ambulatory Visit (INDEPENDENT_AMBULATORY_CARE_PROVIDER_SITE_OTHER): Payer: BC Managed Care – PPO | Admitting: Family

## 2015-09-04 VITALS — BP 127/80 | HR 79 | Temp 98.7°F | Ht 70.0 in | Wt 293.0 lb

## 2015-09-04 DIAGNOSIS — J309 Allergic rhinitis, unspecified: Secondary | ICD-10-CM | POA: Diagnosis not present

## 2015-09-04 DIAGNOSIS — J0101 Acute recurrent maxillary sinusitis: Secondary | ICD-10-CM | POA: Diagnosis not present

## 2015-09-04 DIAGNOSIS — Z1211 Encounter for screening for malignant neoplasm of colon: Secondary | ICD-10-CM

## 2015-09-04 MED ORDER — AMOXICILLIN-POT CLAVULANATE 875-125 MG PO TABS: 1.0000 | ORAL_TABLET | Freq: Two times a day (BID) | ORAL | Status: DC

## 2015-09-04 MED ORDER — FLUTICASONE PROPIONATE 50 MCG/ACT NA SUSP
2.0000 | Freq: Every day | NASAL | Status: DC
Start: 2015-09-04 — End: 2015-11-17

## 2015-09-04 NOTE — Progress Notes (Signed)
Subjective:    Patient ID: Brian Roach, male    DOB: 18-Apr-1960, 55 y.o.   MRN: [id number]  Sinus Problem This is a new problem. The current episode started in the past 7 days. The problem has been gradually worsening since onset. There has been no fever. His pain is at a severity of 2/10. The pain is moderate. Associated symptoms include congestion, headaches, a hoarse voice, sinus pressure and sneezing. Pertinent negatives include no chills, coughing, ear pain or sore throat. Past treatments include acetaminophen. The treatment provided mild relief.      Review of Systems  Constitutional: Negative.  Negative for chills.  HENT: Positive for congestion, hoarse voice, sinus pressure and sneezing. Negative for ear pain and sore throat.   Respiratory: Negative.  Negative for cough.   Cardiovascular: Negative.   Gastrointestinal: Negative.   Endocrine: Negative.   Genitourinary: Negative.   Musculoskeletal: Negative.   Neurological: Positive for headaches.  Hematological: Negative.   Psychiatric/Behavioral: Negative.   All other systems reviewed and are negative.      Objective:   Physical Exam  Constitutional: He is oriented to person, place, and time. He appears well-developed and well-nourished. No distress.  HENT:  Head: Normocephalic.  Right Ear: External ear normal.  Left Ear: External ear normal.  Nose: Mucosal edema and rhinorrhea present. Right sinus exhibits maxillary sinus tenderness and frontal sinus tenderness. Left sinus exhibits maxillary sinus tenderness and frontal sinus tenderness.  Mouth/Throat: Posterior oropharyngeal erythema present.  Eyes: Pupils are equal, round, and reactive to light. Right eye exhibits no discharge. Left eye exhibits no discharge.  Neck: Normal range of motion. Neck supple. No thyromegaly present.  Cardiovascular: Normal rate, regular rhythm, normal heart sounds and intact distal pulses.   No murmur heard. Pulmonary/Chest: Effort  normal and breath sounds normal. No respiratory distress. He has no wheezes.  Abdominal: Soft. Bowel sounds are normal. He exhibits no distension. There is no tenderness.  Musculoskeletal: Normal range of motion. He exhibits no edema or tenderness.  Neurological: He is alert and oriented to person, place, and time. He has normal reflexes. No cranial nerve deficit.  Skin: Skin is warm and dry. No rash noted. No erythema.  Psychiatric: He has a normal mood and affect. His behavior is normal. Judgment and thought content normal.  Vitals reviewed.    BP 127/80 mmHg  Pulse 79  Temp(Src) 98.7 F (37.1 C) (Oral)  Ht 5\' 10"  (1.778 m)  Wt 293 lb (132.904 kg)  BMI 42.04 kg/m2      Assessment & Plan:  1. Acute recurrent maxillary sinusitis -- Take meds as prescribed - Use a cool mist humidifier  -Use saline nose sprays frequently -Saline irrigations of the nose can be very helpful if done frequently.  * 4X daily for 1 week*  * Use of a nettie pot can be helpful with this. Follow directions with this* -Force fluids -For any cough or congestion  Use plain Mucinex- regular strength or max strength is fine   * Children- consult with Pharmacist for dosing -For fever or aces or pains- take tylenol or ibuprofen appropriate for age and weight.  * for fevers greater than 101 orally you may alternate ibuprofen and tylenol every  3 hours. -Throat lozenges if help - amoxicillin-clavulanate (AUGMENTIN) 875-125 MG tablet; Take 1 tablet by mouth 2 (two) times daily.  Dispense: 14 tablet; Refill: 0 - fluticasone (FLONASE) 50 MCG/ACT nasal spray; Place 2 sprays into both nostrils daily.  Dispense: 16  g; Refill: 6  2. Allergic rhinitis, unspecified allergic rhinitis type - fluticasone (FLONASE) 50 MCG/ACT nasal spray; Place 2 sprays into both nostrils daily.  Dispense: 16 g; Refill: 6  3. Colon cancer screening - Ambulatory referral to Gastroenterology  Jannifer Rodney, FNP

## 2015-09-04 NOTE — Patient Instructions (Addendum)
Sinusitis, Adult Sinusitis is redness, soreness, and inflammation of the paranasal sinuses. Paranasal sinuses are air pockets within the bones of your face. They are located beneath your eyes, in the middle of your forehead, and above your eyes. In healthy paranasal sinuses, mucus is able to drain out, and air is able to circulate through them by way of your nose. However, when your paranasal sinuses are inflamed, mucus and air can become trapped. This can allow bacteria and other germs to grow and cause infection. Sinusitis can develop quickly and last only a short time (acute) or continue over a long period (chronic). Sinusitis that lasts for more than 12 weeks is considered chronic. CAUSES Causes of sinusitis include:  Allergies.  Structural abnormalities, such as displacement of the cartilage that separates your nostrils (deviated septum), which can decrease the air flow through your nose and sinuses and affect sinus drainage.  Functional abnormalities, such as when the small hairs (cilia) that line your sinuses and help remove mucus do not work properly or are not present. SIGNS AND SYMPTOMS Symptoms of acute and chronic sinusitis are the same. The primary symptoms are pain and pressure around the affected sinuses. Other symptoms include:  Upper toothache.  Earache.  Headache.  Bad breath.  Decreased sense of smell and taste.  A cough, which worsens when you are lying flat.  Fatigue.  Fever.  Thick drainage from your nose, which often is green and may contain pus (purulent).  Swelling and warmth over the affected sinuses. DIAGNOSIS Your health care provider will perform a physical exam. During your exam, your health care provider may perform any of the following to help determine if you have acute sinusitis or chronic sinusitis:  Look in your nose for signs of abnormal growths in your nostrils (nasal polyps).  Tap over the affected sinus to check for signs of  infection.  View the inside of your sinuses using an imaging device that has a light attached (endoscope). If your health care provider suspects that you have chronic sinusitis, one or more of the following tests may be recommended:  Allergy tests.  Nasal culture. A sample of mucus is taken from your nose, sent to a lab, and screened for bacteria.  Nasal cytology. A sample of mucus is taken from your nose and examined by your health care provider to determine if your sinusitis is related to an allergy. TREATMENT Most cases of acute sinusitis are related to a viral infection and will resolve on their own within 10 days. Sometimes, medicines are prescribed to help relieve symptoms of both acute and chronic sinusitis. These may include pain medicines, decongestants, nasal steroid sprays, or saline sprays. However, for sinusitis related to a bacterial infection, your health care provider will prescribe antibiotic medicines. These are medicines that will help kill the bacteria causing the infection. Rarely, sinusitis is caused by a fungal infection. In these cases, your health care provider will prescribe antifungal medicine. For some cases of chronic sinusitis, surgery is needed. Generally, these are cases in which sinusitis recurs more than 3 times per year, despite other treatments. HOME CARE INSTRUCTIONS  Drink plenty of water. Water helps thin the mucus so your sinuses can drain more easily.  Use a humidifier.  Inhale steam 3-4 times a day (for example, sit in the bathroom with the shower running).  Apply a warm, moist washcloth to your face 3-4 times a day, or as directed by your health care provider.  Use saline nasal sprays to help   moisten and clean your sinuses.  Take medicines only as directed by your health care provider.  If you were prescribed either an antibiotic or antifungal medicine, finish it all even if you start to feel better. SEEK IMMEDIATE MEDICAL CARE IF:  You have  increasing pain or severe headaches.  You have nausea, vomiting, or drowsiness.  You have swelling around your face.  You have vision problems.  You have a stiff neck.  You have difficulty breathing.   This information is not intended to replace advice given to you by your health care provider. Make sure you discuss any questions you have with your health care provider.   Document Released: 02/15/2005 Document Revised: 03/08/2014 Document Reviewed: 03/02/2011 Elsevier Interactive Patient Education 2016 [employer]  - Take meds as prescribed - Use a cool mist humidifier  -Use saline nose sprays frequently -Saline irrigations of the nose can be very helpful if done frequently.  * 4X daily for 1 week*  * Use of a nettie pot can be helpful with this. Follow directions with this* -Force fluids -For any cough or congestion  Use plain Mucinex- regular strength or max strength is fine   * Children- consult with Pharmacist for dosing -For fever or aces or pains- take tylenol or ibuprofen appropriate for age and weight.  * for fevers greater than 101 orally you may alternate ibuprofen and tylenol every  3 hours. -Throat lozenges if help   Graden Hoshino, FNP   

## 2015-09-09 ENCOUNTER — Ambulatory Visit: Payer: BC Managed Care – PPO | Admitting: Family

## 2015-11-04 ENCOUNTER — Ambulatory Visit (INDEPENDENT_AMBULATORY_CARE_PROVIDER_SITE_OTHER): Payer: BC Managed Care – PPO | Admitting: Family Medicine

## 2015-11-04 ENCOUNTER — Encounter: Payer: Self-pay | Admitting: Family Medicine

## 2015-11-04 VITALS — BP 112/73 | HR 75 | Temp 97.5°F | Ht 70.0 in | Wt 290.1 lb

## 2015-11-04 DIAGNOSIS — J0101 Acute recurrent maxillary sinusitis: Secondary | ICD-10-CM | POA: Diagnosis not present

## 2015-11-04 DIAGNOSIS — I10 Essential (primary) hypertension: Secondary | ICD-10-CM | POA: Diagnosis not present

## 2015-11-04 DIAGNOSIS — E7800 Pure hypercholesterolemia, unspecified: Secondary | ICD-10-CM | POA: Diagnosis not present

## 2015-11-04 MED ORDER — MOMETASONE FUROATE 50 MCG/ACT NA SUSP: 2.0000 | Freq: Every day | NASAL | 12 refills | Status: DC

## 2015-11-04 MED ORDER — BETAMETHASONE SOD PHOS & ACET 6 (3-3) MG/ML IJ SUSP
6.0000 mg | Freq: Once | INTRAMUSCULAR | Status: AC
  Administered 2015-11-04: 6 mg via INTRAMUSCULAR

## 2015-11-04 MED ORDER — LEVOFLOXACIN 500 MG PO TABS: 500.0000 mg | ORAL_TABLET | Freq: Every day | ORAL | 0 refills | Status: DC

## 2015-11-04 NOTE — Progress Notes (Addendum)
Subjective:  Patient ID: Brian Roach, male    DOB: 25-Nov-1960  Age: 55 y.o. MRN: [id number]  CC: Sinusitis (pt here today c/o sinus pressure, sinus headaches, itchy/watery eyes, sneezing and ear "itching", pt is also requesting to have his bloodwork for hypertension and hypercholesteremia since he is fasting this morning.)   HPI Brian Roach presents for Symptoms as noted above, include congestion, facial pain, nasal congestion, non productive cough, post nasal drip and sinus pressure with no fever, chills, night sweats or weight loss. Onset of symptoms was several months ago. Although it got somewhat better with treatment at previous visits it never completely resolved.   follow-up of hypertension. Patient has no history of headache chest pain or shortness of breath or recent cough. Patient also denies symptoms of TIA such as numbness weakness lateralizing. Patient checks  blood pressure at home and has not had any elevated readings recently. Patient denies side effects from his medication. States taking it regularly.   Patient in for follow-up of elevated cholesterol. Doing well without complaints on current medication. Denies side effects of statin including myalgia and arthralgia and nausea. Also in today for liver function testing. Currently no chest pain, shortness of breath or other cardiovascular related symptoms noted.   History Brian Roach has a past medical history of Hyperlipidemia; Hypertension; and PE (pulmonary embolism).   He has a past surgical history that includes Back surgery; Knee surgery; Lung surgery; and Anterior cruciate ligament repair (Right).   His family history includes Heart disease in his father.He reports that he has never smoked. He has never used smokeless tobacco. He reports that he does not drink alcohol or use drugs.    ROS Review of Systems  Constitutional: Negative for activity change, appetite change, chills and fever.  HENT: Positive for  congestion, postnasal drip, rhinorrhea and sinus pressure. Negative for ear discharge, ear pain, hearing loss, nosebleeds, sneezing and trouble swallowing.   Respiratory: Negative for chest tightness and shortness of breath.   Cardiovascular: Negative for chest pain and palpitations.  Skin: Negative for rash.    Objective:  BP 112/73   Pulse 75   Temp 97.5 F (36.4 C) (Oral)   Ht _0  (1.778 m)   Wt 290 lb 2 oz (131.6 kg)   BMI 41.63 kg/m   BP Readings from Last 3 Encounters:  11/04/15 112/73  09/04/15 127/80  02/19/15 137/81    Wt Readings from Last 3 Encounters:  11/04/15 290 lb 2 oz (131.6 kg)  09/04/15 293 lb (132.9 kg)  02/19/15 (!) 301 lb 9.6 oz (136.8 kg)     Physical Exam  Constitutional: He appears well-developed and well-nourished.  HENT:  Head: Normocephalic and atraumatic.  Right Ear: Tympanic membrane and external ear normal. No decreased hearing is noted.  Left Ear: Tympanic membrane and external ear normal. No decreased hearing is noted.  Nose: Mucosal edema present. Right sinus exhibits no frontal sinus tenderness. Left sinus exhibits no frontal sinus tenderness.  Mouth/Throat: No oropharyngeal exudate or posterior oropharyngeal erythema.  Neck: No Brudzinski's sign noted.  Pulmonary/Chest: Breath sounds normal. No respiratory distress.  Lymphadenopathy:       Head (right side): No preauricular adenopathy present.       Head (left side): No preauricular adenopathy present.       Right cervical: No superficial cervical adenopathy present.      Left cervical: No superficial cervical adenopathy present.     Lab Results  Component Value Date  WBC 11.0 (A) 06/27/2013   HGB 14.6 06/27/2013   HCT 46.3 06/27/2013   PLT 275 05/28/2012   GLUCOSE 106 (H) 02/19/2015   CHOL 189 02/19/2015   TRIG 133 02/19/2015   HDL 36 (L) 02/19/2015   LDLCALC 126 (H) 02/19/2015   ALT 59 (H) 02/19/2015   AST 41 (H) 02/19/2015   NA 139 02/19/2015   K 4.8 02/19/2015    CL 97 02/19/2015   CREATININE 0.95 02/19/2015   BUN 14 02/19/2015   CO2 24 02/19/2015   TSH 4.200 06/27/2013   PSA 0.8 06/27/2013   INR 1.09 05/28/2012   HGBA1C 6.4 (H) 05/27/2012    Dg Chest 2 View  Result Date: 05/27/2012 *RADIOLOGY REPORT* Clinical Data: Shoulder pain and neck pain. CHEST - 2 VIEW Comparison: 10/16/2003 Findings: Two views of the chest demonstrate stable elevation of the right hemidiaphragm and chronic densities along the right lateral ribs.  Otherwise, the lungs are clear.  Heart and mediastinum are stable.  Trachea is midline.  No evidence for pleural effusions. IMPRESSION: No acute chest findings. Original Report Authenticated By: Markus Daft, M.D.    Assessment & Plan:   Brian Roach was seen today for sinusitis.  Diagnoses and all orders for this visit:  HTN (hypertension), benign -     CBC with Differential/Platelet -     CMP14+EGFR  Hypercholesteremia -     CBC with Differential/Platelet -     CMP14+EGFR -     Lipid panel  Acute recurrent maxillary sinusitis -     betamethasone acetate-betamethasone sodium phosphate (CELESTONE) injection 6 mg; Inject 1 mL (6 mg total) into the muscle once. -     CBC with Differential/Platelet -     CMP14+EGFR  Other orders -     levofloxacin (LEVAQUIN) 500 MG tablet; Take 1 tablet (500 mg total) by mouth daily. For 10 days -     mometasone (NASONEX) 50 MCG/ACT nasal spray; Place 2 sprays into the nose daily.    Meds ordered this encounter  Medications  . levofloxacin (LEVAQUIN) 500 MG tablet    Sig: Take 1 tablet (500 mg total) by mouth daily. For 10 days    Dispense:  10 tablet    Refill:  0  . mometasone (NASONEX) 50 MCG/ACT nasal spray    Sig: Place 2 sprays into the nose daily.    Dispense:  17 g    Refill:  12  . betamethasone acetate-betamethasone sodium phosphate (CELESTONE) injection 6 mg     Follow-up: Return in about 6 months (around 05/03/2016).  Claretta Fraise, M.D.

## 2015-11-05 LAB — CBC WITH DIFFERENTIAL/PLATELET
BASOS ABS: 0.1 10*3/uL (ref 0.0–0.2)
Basos: 1 %
EOS (ABSOLUTE): 0.3 10*3/uL (ref 0.0–0.4)
Eos: 2 %
Hematocrit: 48 % (ref 37.5–51.0)
Hemoglobin: 16 g/dL (ref 12.6–17.7)
IMMATURE GRANULOCYTES: 0 %
Immature Grans (Abs): 0 10*3/uL (ref 0.0–0.1)
LYMPHS ABS: 3.5 10*3/uL — AB (ref 0.7–3.1)
Lymphs: 31 %
MCH: 30.5 pg (ref 26.6–33.0)
MCHC: 33.3 g/dL (ref 31.5–35.7)
MCV: 91 fL (ref 79–97)
MONOS ABS: 1 10*3/uL — AB (ref 0.1–0.9)
Monocytes: 9 %
NEUTROS PCT: 57 %
Neutrophils Absolute: 6.6 10*3/uL (ref 1.4–7.0)
PLATELETS: 306 10*3/uL (ref 150–379)
RBC: 5.25 x10E6/uL (ref 4.14–5.80)
RDW: 13.9 % (ref 12.3–15.4)
WBC: 11.5 10*3/uL — AB (ref 3.4–10.8)

## 2015-11-05 LAB — CMP14+EGFR
ALK PHOS: 91 IU/L (ref 39–117)
ALT: 88 IU/L — AB (ref 0–44)
AST: 61 IU/L — AB (ref 0–40)
Albumin/Globulin Ratio: 1.7 (ref 1.2–2.2)
Albumin: 4.7 g/dL (ref 3.5–5.5)
BUN/Creatinine Ratio: 13 (ref 9–20)
BUN: 13 mg/dL (ref 6–24)
Bilirubin Total: 0.8 mg/dL (ref 0.0–1.2)
CO2: 26 mmol/L (ref 18–29)
CREATININE: 0.98 mg/dL (ref 0.76–1.27)
Calcium: 10 mg/dL (ref 8.7–10.2)
Chloride: 97 mmol/L (ref 96–106)
GFR calc Af Amer: 99 mL/min/{1.73_m2} (ref >59)
GFR calc non Af Amer: 85 mL/min/{1.73_m2} (ref >59)
GLOBULIN, TOTAL: 2.8 g/dL (ref 1.5–4.5)
GLUCOSE: 121 mg/dL — AB (ref 65–99)
Potassium: 4.9 mmol/L (ref 3.5–5.2)
SODIUM: 141 mmol/L (ref 134–144)
Total Protein: 7.5 g/dL (ref 6.0–8.5)

## 2015-11-05 LAB — LIPID PANEL
CHOLESTEROL TOTAL: 143 mg/dL (ref 100–199)
Chol/HDL Ratio: 3.9 ratio units (ref 0.0–5.0)
HDL: 37 mg/dL — AB (ref >39)
LDL CALC: 78 mg/dL (ref 0–99)
TRIGLYCERIDES: 141 mg/dL (ref 0–149)
VLDL Cholesterol Cal: 28 mg/dL (ref 5–40)

## 2015-11-07 ENCOUNTER — Telehealth: Payer: Self-pay | Admitting: *Deleted

## 2015-11-07 LAB — SPECIMEN STATUS REPORT

## 2015-11-07 LAB — HGB A1C W/O EAG: Hgb A1c MFr Bld: 7.5 % — ABNORMAL HIGH (ref 4.8–5.6)

## 2015-11-07 NOTE — Telephone Encounter (Signed)
Pt notified of results  Verbalizes understanding Will call back to schedule appt 

## 2015-11-07 NOTE — Telephone Encounter (Signed)
-----   Message from Warren Stacks, MD sent at 11/07/2015 10:57 AM EDT ----- Hello Brian Roach, Your lab report shows that you have developed diabetes. I would like for you to make an appointment to come in for us to discuss a strategy to work on this problem. Best Regards, Warren Stacks, M.D. 

## 2015-11-07 NOTE — Telephone Encounter (Signed)
-----   Message from Mechele ClaudeWarren Stacks, MD sent at 11/07/2015 10:57 AM EDT ----- [place] HollowHello Brian Roach, Your lab report shows that you have developed diabetes. I would like for you to make an appointment to come in for us to discuss a strategy to work on this problem. Best Regards, Mechele ClaudeWarren Stacks, M.D.

## 2015-11-13 ENCOUNTER — Other Ambulatory Visit: Payer: Self-pay | Admitting: Family

## 2015-11-14 ENCOUNTER — Ambulatory Visit: Payer: Self-pay | Admitting: Family Medicine

## 2015-11-17 ENCOUNTER — Telehealth: Payer: Self-pay | Admitting: Family Medicine

## 2015-11-17 ENCOUNTER — Encounter: Payer: Self-pay | Admitting: Family Medicine

## 2015-11-17 ENCOUNTER — Ambulatory Visit (INDEPENDENT_AMBULATORY_CARE_PROVIDER_SITE_OTHER): Payer: BC Managed Care – PPO | Admitting: Family Medicine

## 2015-11-17 DIAGNOSIS — M5431 Sciatica, right side: Secondary | ICD-10-CM | POA: Diagnosis not present

## 2015-11-17 DIAGNOSIS — E1169 Type 2 diabetes mellitus with other specified complication: Secondary | ICD-10-CM | POA: Insufficient documentation

## 2015-11-17 DIAGNOSIS — IMO0002 Reserved for concepts with insufficient information to code with codable children: Secondary | ICD-10-CM | POA: Insufficient documentation

## 2015-11-17 DIAGNOSIS — Z23 Encounter for immunization: Secondary | ICD-10-CM | POA: Diagnosis not present

## 2015-11-17 DIAGNOSIS — IMO0001 Reserved for inherently not codable concepts without codable children: Secondary | ICD-10-CM

## 2015-11-17 DIAGNOSIS — E1165 Type 2 diabetes mellitus with hyperglycemia: Secondary | ICD-10-CM

## 2015-11-17 MED ORDER — METFORMIN HCL ER 500 MG PO TB24: 500.0000 mg | ORAL_TABLET | Freq: Every day | ORAL | 2 refills | Status: DC

## 2015-11-17 MED ORDER — CYCLOBENZAPRINE HCL 10 MG PO TABS: 10.0000 mg | ORAL_TABLET | Freq: Three times a day (TID) | ORAL | 1 refills | Status: DC | PRN

## 2015-11-17 MED ORDER — BLOOD GLUCOSE MONITOR KIT: PACK | 0 refills | Status: DC

## 2015-11-17 MED ORDER — KETOROLAC TROMETHAMINE 60 MG/2ML IM SOLN
60.0000 mg | Freq: Once | INTRAMUSCULAR | Status: AC
  Administered 2015-11-17: 60 mg via INTRAMUSCULAR

## 2015-11-17 MED ORDER — DICLOFENAC SODIUM 75 MG PO TBEC: 75.0000 mg | DELAYED_RELEASE_TABLET | Freq: Two times a day (BID) | ORAL | 2 refills | Status: DC

## 2015-11-17 NOTE — Patient Instructions (Signed)
Diabetes and Exercise Exercising regularly is important. It is not just about losing weight. It has many health benefits, such as:  Improving your overall fitness, flexibility, and endurance.  Increasing your bone density.  Helping with weight control.  Decreasing your body fat.  Increasing your muscle strength.  Reducing stress and tension.  Improving your overall health. People with diabetes who exercise gain additional benefits because exercise:  Reduces appetite.  Improves the body's use of blood sugar (glucose).  Helps lower or control blood glucose.  Decreases blood pressure.  Helps control blood lipids (such as cholesterol and triglycerides).  Improves the body's use of the hormone insulin by:  Increasing the body's insulin sensitivity.  Reducing the body's insulin needs.  Decreases the risk for heart disease because exercising:  Lowers cholesterol and triglycerides levels.  Increases the levels of good cholesterol (such as high-density lipoproteins [HDL]) in the body.  Lowers blood glucose levels. YOUR ACTIVITY PLAN  Choose an activity that you enjoy, and set realistic goals. To exercise safely, you should begin practicing any new physical activity slowly, and gradually increase the intensity of the exercise over time. Your health care provider or diabetes educator can help create an activity plan that works for you. General recommendations include:  Encouraging children to engage in at least 60 minutes of physical activity each day.  Stretching and performing strength training exercises, such as yoga or weight lifting, at least 2 times per week.  Performing a total of at least 150 minutes of moderate-intensity exercise each week, such as brisk walking or water aerobics.  Exercising at least 3 days per week, making sure you allow no more than 2 consecutive days to pass without exercising.  Avoiding long periods of inactivity (90 minutes or more). When you  have to spend an extended period of time sitting down, take frequent breaks to walk or stretch. RECOMMENDATIONS FOR EXERCISING WITH TYPE 1 OR TYPE 2 DIABETES   Check your blood glucose before exercising. If blood glucose levels are greater than 240 mg/dL, check for urine ketones. Do not exercise if ketones are present.  Avoid injecting insulin into areas of the body that are going to be exercised. For example, avoid injecting insulin into:  The arms when playing tennis.  The legs when jogging.  Keep a record of:  Food intake before and after you exercise.  Expected peak times of insulin action.  Blood glucose levels before and after you exercise.  The type and amount of exercise you have done.  Review your records with your health care provider. Your health care provider will help you to develop guidelines for adjusting food intake and insulin amounts before and after exercising.  If you take insulin or oral hypoglycemic agents, watch for signs and symptoms of hypoglycemia. They include:  Dizziness.  Shaking.  Sweating.  Chills.  Confusion.  Drink plenty of water while you exercise to prevent dehydration or heat stroke. Body water is lost during exercise and must be replaced.  Talk to your health care provider before starting an exercise program to make sure it is safe for you. Remember, almost any type of activity is better than none.   This information is not intended to replace advice given to you by your health care provider. Make sure you discuss any questions you have with your health care provider.   Document Released: 05/08/2003 Document Revised: 07/02/2014 Document Reviewed: 07/25/2012 Elsevier Interactive Patient Education 2016 Else[employer] Inc. Basic Carbohydrate Counting for Diabetes Mellitus Carbohydrate counting   is a method for keeping track of the amount of carbohydrates you eat. Eating carbohydrates naturally increases the level of sugar (glucose) in your  blood, so it is important for you to know the amount that is okay for you to have in every meal. Carbohydrate counting helps keep the level of glucose in your blood within normal limits. The amount of carbohydrates allowed is different for every person. A dietitian can help you calculate the amount that is right for you. Once you know the amount of carbohydrates you can have, you can count the carbohydrates in the foods you want to eat. Carbohydrates are found in the following foods:  Grains, such as breads and cereals.  Dried beans and soy products.  Starchy vegetables, such as potatoes, peas, and corn.  Fruit and fruit juices.  Milk and yogurt.  Sweets and snack foods, such as cake, cookies, candy, chips, soft drinks, and fruit drinks. CARBOHYDRATE COUNTING There are two ways to count the carbohydrates in your food. You can use either of the methods or a combination of both. Reading the "Nutrition Facts" on Packaged Food The "Nutrition Facts" is an area that is included on the labels of almost all packaged food and beverages in the [place]. It includes the serving size of that food or beverage and information about the nutrients in each serving of the food, including the grams (g) of carbohydrate per serving.  Decide the number of servings of this food or beverage that you will be able to eat or drink. Multiply that number of servings by the number of grams of carbohydrate that is listed on the label for that serving. The total will be the amount of carbohydrates you will be having when you eat or drink this food or beverage. Learning Standard Serving Sizes of Food When you eat food that is not packaged or does not include "Nutrition Facts" on the label, you need to measure the servings in order to count the amount of carbohydrates.A serving of most carbohydrate-rich foods contains about 15 g of carbohydrates. The following list includes serving sizes of carbohydrate-rich foods that  provide 15 g ofcarbohydrate per serving:   1 slice of bread (1 oz) or 1 six-inch tortilla.    of a hamburger bun or English muffin.  4-6 crackers.   cup unsweetened dry cereal.    cup hot cereal.   cup rice or pasta.    cup mashed potatoes or  of a large baked potato.  1 cup fresh fruit or one small piece of fruit.    cup canned or frozen fruit or fruit juice.  1 cup milk.   cup plain fat-free yogurt or yogurt sweetened with artificial sweeteners.   cup cooked dried beans or starchy vegetable, such as peas, corn, or potatoes.  Decide the number of standard-size servings that you will eat. Multiply that number of servings by 15 (the grams of carbohydrates in that serving). For example, if you eat 2 cups of strawberries, you will have eaten 2 servings and 30 g of carbohydrates (2 servings x 15 g = 30 g). For foods such as soups and casseroles, in which more than one food is mixed in, you will need to count the carbohydrates in each food that is included. EXAMPLE OF CARBOHYDRATE COUNTING Sample Dinner  3 oz chicken breast.   cup of brown rice.   cup of corn.  1 cup milk.   1 cup strawberries with sugar-free whipped topping.  Carbohydrate Calculation Step   1: Identify the foods that contain carbohydrates:   Rice.   Corn.   Milk.   Strawberries. Step 2:Calculate the number of servings eaten of each:   2 servings of rice.   1 serving of corn.   1 serving of milk.   1 serving of strawberries. Step 3: Multiply each of those number of servings by 15 g:   2 servings of rice x 15 g = 30 g.   1 serving of corn x 15 g = 15 g.   1 serving of milk x 15 g = 15 g.   1 serving of strawberries x 15 g = 15 g. Step 4: Add together all of the amounts to find the total grams of carbohydrates eaten: 30 g + 15 g + 15 g + 15 g = 75 g.   This information is not intended to replace advice given to you by your health care provider. Make sure you  discuss any questions you have with your health care provider.   Document Released: 02/15/2005 Document Revised: 03/08/2014 Document Reviewed: 01/12/2013 Elsevier Interactive Patient Education 2016 Else[employer] Inc. Back Exercises The following exercises strengthen the muscles that help to support the back. They also help to keep the lower back flexible. Doing these exercises can help to prevent back pain or lessen existing pain. If you have back pain or discomfort, try doing these exercises 2-3 times each day or as told by your health care provider. When the pain goes away, do them once each day, but increase the number of times that you repeat the steps for each exercise (do more repetitions). If you do not have back pain or discomfort, do these exercises once each day or as told by your health care provider. EXERCISES Single Knee to Chest Repeat these steps 3-5 times for each leg: 1. Lie on your back on a firm bed or the floor with your legs extended. 2. Bring one knee to your chest. Your other leg should stay extended and in contact with the floor. 3. Hold your knee in place by grabbing your knee or thigh. 4. Pull on your knee until you feel a gentle stretch in your lower back. 5. Hold the stretch for 10-30 seconds. 6. Slowly release and straighten your leg. Pelvic Tilt Repeat these steps 5-10 times: 1. Lie on your back on a firm bed or the floor with your legs extended. 2. Bend your knees so they are pointing toward the ceiling and your feet are flat on the floor. 3. Tighten your lower abdominal muscles to press your lower back against the floor. This motion will tilt your pelvis so your tailbone points up toward the ceiling instead of pointing to your feet or the floor. 4. With gentle tension and even breathing, hold this position for 5-10 seconds. Cat-Cow Repeat these steps until your lower back becomes more flexible: 1. Get into a hands-and-knees position on a firm surface. Keep your  hands under your shoulders, and keep your knees under your hips. You may place padding under your knees for comfort. 2. Let your head hang down, and point your tailbone toward the floor so your lower back becomes rounded like the back of a cat. 3. Hold this position for 5 seconds. 4. Slowly lift your head and point your tailbone up toward the ceiling so your back forms a sagging arch like the back of a cow. 5. Hold this position for 5 seconds. Press-Ups Repeat these steps 5-10 times: 1. Lie on  your abdomen (face-down) on the floor. 2. Place your palms near your head, about shoulder-width apart. 3. While you keep your back as relaxed as possible and keep your hips on the floor, slowly straighten your arms to raise the top half of your body and lift your shoulders. Do not use your back muscles to raise your upper torso. You may adjust the placement of your hands to make yourself more comfortable. 4. Hold this position for 5 seconds while you keep your back relaxed. 5. Slowly return to lying flat on the floor. Bridges Repeat these steps 10 times: 1. Lie on your back on a firm surface. 2. Bend your knees so they are pointing toward the ceiling and your feet are flat on the floor. 3. Tighten your buttocks muscles and lift your buttocks off of the floor until your waist is at almost the same height as your knees. You should feel the muscles working in your buttocks and the back of your thighs. If you do not feel these muscles, slide your feet 1-2 inches farther away from your buttocks. 4. Hold this position for 3-5 seconds. 5. Slowly lower your hips to the starting position, and allow your buttocks muscles to relax completely. If this exercise is too easy, try doing it with your arms crossed over your chest. Abdominal Crunches Repeat these steps 5-10 times: 1. Lie on your back on a firm bed or the floor with your legs extended. 2. Bend your knees so they are pointing toward the ceiling and your feet  are flat on the floor. 3. Cross your arms over your chest. 4. Tip your chin slightly toward your chest without bending your neck. 5. Tighten your abdominal muscles and slowly raise your trunk (torso) high enough to lift your shoulder blades a tiny bit off of the floor. Avoid raising your torso higher than that, because it can put too much stress on your low back and it does not help to strengthen your abdominal muscles. 6. Slowly return to your starting position. Back Lifts Repeat these steps 5-10 times: 1. Lie on your abdomen (face-down) with your arms at your sides, and rest your forehead on the floor. 2. Tighten the muscles in your legs and your buttocks. 3. Slowly lift your chest off of the floor while you keep your hips pressed to the floor. Keep the back of your head in line with the curve in your back. Your eyes should be looking at the floor. 4. Hold this position for 3-5 seconds. 5. Slowly return to your starting position. SEEK MEDICAL CARE IF:  Your back pain or discomfort gets much worse when you do an exercise.  Your back pain or discomfort does not lessen within 2 hours after you exercise. If you have any of these problems, stop doing these exercises right away. Do not do them again unless your health care provider says that you can. SEEK IMMEDIATE MEDICAL CARE IF:  You develop sudden, severe back pain. If this happens, stop doing the exercises right away. Do not do them again unless your health care provider says that you can.   This information is not intended to replace advice given to you by your health care provider. Make sure you discuss any questions you have with your health care provider.   Document Released: 03/25/2004 Document Revised: 11/06/2014 Document Reviewed: 04/11/2014 Elsevier Interactive Patient Education [employer]2016 Elsevier Inc.

## 2015-11-17 NOTE — Telephone Encounter (Signed)
Patient called and stated that insurance company will not cover Contour test strips.  Patient will find out what insurance company will cover.

## 2015-11-17 NOTE — Progress Notes (Signed)
Subjective:  Patient ID: Brian Roach, male    DOB: September 10, 1960  Age: 55 y.o. MRN: [id number]  CC: Back Pain (pt here to go over test results but also c/o lower back pain mostly on the right side)   HPI Brian Roach presents for Right lower back pain. This extends into the buttocks and down the right leg. It is moderately severe. He has a history of 2 back surgeries. He's been told that he might need a fusion. However, he does not want to do that. He says that usually an anti-inflammatory and muscle relaxer will do the trick. He would like to try that again.  Patient was found to be diabetic at his most recent office visit. He had an elevated blood sugar. He was noted to have a pre-diabetic hemoglobin A1c in the past. Therefore A1c was performed showing 7.5. He was asked to come in to discuss diabetes and treatment for the condition.   History Brian Roach has a past medical history of Hyperlipidemia; Hypertension; and PE (pulmonary embolism).   He has a past surgical history that includes Back surgery; Knee surgery; Lung surgery; and Anterior cruciate ligament repair (Right).   His family history includes Heart disease in his father.He reports that he has never smoked. He has never used smokeless tobacco. He reports that he does not drink alcohol or use drugs.    ROS Review of Systems  Constitutional: Negative for chills, diaphoresis and fever.  HENT: Negative for rhinorrhea and sore throat.   Respiratory: Negative for cough and shortness of breath.   Cardiovascular: Negative for chest pain.  Gastrointestinal: Negative for abdominal pain.  Musculoskeletal: Negative for arthralgias and myalgias.  Skin: Negative for rash.  Neurological: Negative for weakness and headaches.    Objective:  BP 109/67   Pulse 69   Temp 97.5 F (36.4 C) (Oral)   Ht '5\' 10"'$  (1.778 m)   Wt 291 lb 8 oz (132.2 kg)   BMI 41.83 kg/m   BP Readings from Last 3 Encounters:  11/17/15 109/67  11/04/15  112/73  09/04/15 127/80    Wt Readings from Last 3 Encounters:  11/17/15 291 lb 8 oz (132.2 kg)  11/04/15 290 lb 2 oz (131.6 kg)  09/04/15 293 lb (132.9 kg)     Physical Exam  Constitutional: He is oriented to person, place, and time. He appears well-developed and well-nourished.  Obese   HENT:  Head: Normocephalic and atraumatic.  Eyes: EOM are normal. Pupils are equal, round, and reactive to light.  Neck: Normal range of motion. Neck supple.  Musculoskeletal: Normal range of motion. He exhibits no edema.  Neurological: He is alert and oriented to person, place, and time. He exhibits normal muscle tone. Coordination normal.  Skin: Skin is warm and dry.  Psychiatric: He has a normal mood and affect. His behavior is normal.     Lab Results  Component Value Date   WBC 11.5 (H) 11/04/2015   HGB 14.6 06/27/2013   HCT 48.0 11/04/2015   PLT 306 11/04/2015   GLUCOSE 121 (H) 11/04/2015   CHOL 143 11/04/2015   TRIG 141 11/04/2015   HDL 37 (L) 11/04/2015   LDLCALC 78 11/04/2015   ALT 88 (H) 11/04/2015   AST 61 (H) 11/04/2015   NA 141 11/04/2015   K 4.9 11/04/2015   CL 97 11/04/2015   CREATININE 0.98 11/04/2015   BUN 13 11/04/2015   CO2 26 11/04/2015   TSH 4.200 06/27/2013   PSA 0.8  06/27/2013   INR 1.09 05/28/2012   HGBA1C 7.5 (H) 11/04/2015    Dg Chest 2 View  Result Date: 05/27/2012 *RADIOLOGY REPORT* Clinical Data: Shoulder pain and neck pain. CHEST - 2 VIEW Comparison: 10/16/2003 Findings: Two views of the chest demonstrate stable elevation of the right hemidiaphragm and chronic densities along the right lateral ribs.  Otherwise, the lungs are clear.  Heart and mediastinum are stable.  Trachea is midline.  No evidence for pleural effusions. IMPRESSION: No acute chest findings. Original Report Authenticated By: Markus Daft, M.D.    Assessment & Plan:   [name] was seen today for back pain.  Diagnoses and all orders for this visit:  Uncontrolled type 2 diabetes  mellitus without complication, without long-term current use of insulin (HCC)  Sciatica, right side -     ketorolac (TORADOL) injection 60 mg; Inject 2 mLs (60 mg total) into the muscle once.  Encounter for immunization -     Flu Vaccine QUAD 36+ mos IM  Other orders -     blood glucose meter kit and supplies KIT; Dispense based on patient and insurance preference. Use up to four times daily as directed. (FOR ICD-9 250.00, 250.01). -     metFORMIN (GLUCOPHAGE-XR) 500 MG 24 hr tablet; Take 1 tablet (500 mg total) by mouth daily with breakfast. -     diclofenac (VOLTAREN) 75 MG EC tablet; Take 1 tablet (75 mg total) by mouth 2 (two) times daily. For muscle and  Joint pain -     cyclobenzaprine (FLEXERIL) 10 MG tablet; Take 1 tablet (10 mg total) by mouth 3 (three) times daily as needed for muscle spasms.    30 minutes was spent with the patient today. Over half of that was in counseling regarding his new onset diabetes. He was trained on when and how to monitor his blood sugar. He was trained on hemoglobin A1c both at it means and how it is used. We reviewed in detail a carbohydrate controlled diet with emphasis on using complex carbs when possible. He knows to increase lean proteins using chicken fish etc. He can have some sirloin but no other red meats. We focused on weight loss and regular exercise. Handouts on each of these topics were given.  I have discontinued Brian Roach's fluticasone and levofloxacin. I am also having him start on blood glucose meter kit and supplies, metFORMIN, diclofenac, and cyclobenzaprine. Additionally, I am having him maintain his aspirin, dicyclomine, atorvastatin, benazepril, mometasone, and diltiazem. We administered ketorolac.  Meds ordered this encounter  Medications  . blood glucose meter kit and supplies KIT    Sig: Dispense based on patient and insurance preference. Use up to four times daily as directed. (FOR ICD-9 250.00, 250.01).    Dispense:  1 each     Refill:  0    Order Specific Question:   Number of strips    Answer:   100    Order Specific Question:   Number of lancets    Answer:   100  . metFORMIN (GLUCOPHAGE-XR) 500 MG 24 hr tablet    Sig: Take 1 tablet (500 mg total) by mouth daily with breakfast.    Dispense:  30 tablet    Refill:  2  . ketorolac (TORADOL) injection 60 mg  . diclofenac (VOLTAREN) 75 MG EC tablet    Sig: Take 1 tablet (75 mg total) by mouth 2 (two) times daily. For muscle and  Joint pain    Dispense:  60 tablet  Refill:  2  . cyclobenzaprine (FLEXERIL) 10 MG tablet    Sig: Take 1 tablet (10 mg total) by mouth 3 (three) times daily as needed for muscle spasms.    Dispense:  90 tablet    Refill:  1     Follow-up: Return in about 1 month (around 12/17/2015).  Claretta Fraise, M.D.

## 2015-11-30 ENCOUNTER — Other Ambulatory Visit: Payer: Self-pay | Admitting: Family

## 2015-12-04 ENCOUNTER — Encounter: Payer: Self-pay | Admitting: Family Medicine

## 2015-12-04 ENCOUNTER — Ambulatory Visit (INDEPENDENT_AMBULATORY_CARE_PROVIDER_SITE_OTHER): Payer: BC Managed Care – PPO | Admitting: Family Medicine

## 2015-12-04 VITALS — BP 116/73 | HR 92 | Temp 98.3°F | Ht 70.0 in | Wt 288.0 lb

## 2015-12-04 DIAGNOSIS — IMO0001 Reserved for inherently not codable concepts without codable children: Secondary | ICD-10-CM

## 2015-12-04 DIAGNOSIS — E6609 Other obesity due to excess calories: Secondary | ICD-10-CM | POA: Diagnosis not present

## 2015-12-04 DIAGNOSIS — E1165 Type 2 diabetes mellitus with hyperglycemia: Secondary | ICD-10-CM | POA: Diagnosis not present

## 2015-12-04 DIAGNOSIS — Z6841 Body Mass Index (BMI) 40.0 and over, adult: Secondary | ICD-10-CM

## 2015-12-04 DIAGNOSIS — I10 Essential (primary) hypertension: Secondary | ICD-10-CM | POA: Diagnosis not present

## 2015-12-04 NOTE — Progress Notes (Signed)
Subjective:  Patient ID: Brian Roach, male    DOB: November 12, 1960  Age: 55 y.o. MRN: [id number]  CC: Diabetes (pt here for 2 week recheck after starting Metformin, pt brought in his sugar log)   HPI Brian Roach presents forFollow-up of diabetes. Patient checks blood sugar at home.   110-120  fasting and 130-150 postprandial Patient denies symptoms such as polyuria, polydipsia, excessive hunger, nausea No significant hypoglycemic spells noted. Medications as noted below. Taking them regularly without complication/adverse reaction being reported today. Checking feet daily.  Back pain has resolved. He has discontinued the medications associated with that. But he has some mild remaining constipation. History Brian Roach has a past medical history of Hyperlipidemia; Hypertension; and PE (pulmonary embolism).   He has a past surgical history that includes Back surgery; Knee surgery; Lung surgery; and Anterior cruciate ligament repair (Right).   His family history includes Heart disease in his father.He reports that he has never smoked. He has never used smokeless tobacco. He reports that he does not drink alcohol or use drugs.  Current Outpatient Prescriptions on File Prior to Visit  Medication Sig Dispense Refill  . aspirin EC 81 MG EC tablet Take 1 tablet (81 mg total) by mouth daily.    Marland Kitchen atorvastatin (LIPITOR) 20 MG tablet Take 1 tablet (20 mg total) by mouth daily. 90 tablet 0  . benazepril (LOTENSIN) 20 MG tablet TAKE 1 TABLET (20 MG TOTAL) BY MOUTH DAILY, NEEDS TO BE SEEN 90 tablet 1  . blood glucose meter kit and supplies KIT Dispense based on patient and insurance preference. Use up to four times daily as directed. (FOR ICD-9 250.00, 250.01). 1 each 0  . cyclobenzaprine (FLEXERIL) 10 MG tablet Take 1 tablet (10 mg total) by mouth 3 (three) times daily as needed for muscle spasms. 90 tablet 1  . diclofenac (VOLTAREN) 75 MG EC tablet Take 1 tablet (75 mg total) by mouth 2 (two) times  daily. For muscle and  Joint pain 60 tablet 2  . dicyclomine (BENTYL) 20 MG tablet Take 1 tablet (20 mg total) by mouth every 6 (six) hours. 90 tablet 1  . diltiazem (CARDIZEM CD) 300 MG 24 hr capsule TAKE 1 CAPSULE (300 MG TOTAL) BY MOUTH DAILY. 90 capsule 1  . metFORMIN (GLUCOPHAGE-XR) 500 MG 24 hr tablet Take 1 tablet (500 mg total) by mouth daily with breakfast. 30 tablet 2  . mometasone (NASONEX) 50 MCG/ACT nasal spray Place 2 sprays into the nose daily. 17 g 12   No current facility-administered medications on file prior to visit.     ROS Review of Systems  Constitutional: Negative for chills, diaphoresis and fever.  HENT: Negative for rhinorrhea and sore throat.   Respiratory: Negative for cough and shortness of breath.   Cardiovascular: Negative for chest pain.  Gastrointestinal: Positive for constipation. Negative for abdominal pain.  Musculoskeletal: Negative for arthralgias and myalgias.  Skin: Negative for rash.  Neurological: Negative for weakness and headaches.    Objective:  BP 116/73   Pulse 92   Temp 98.3 F (36.8 C) (Oral)   Ht _0  (1.778 m)   Wt 288 lb (130.6 kg)   BMI 41.32 kg/m   BP Readings from Last 3 Encounters:  12/04/15 116/73  11/17/15 109/67  11/04/15 112/73    Wt Readings from Last 3 Encounters:  12/04/15 288 lb (130.6 kg)  11/17/15 291 lb 8 oz (132.2 kg)  11/04/15 290 lb 2 oz (131.6 kg)  Physical Exam  Constitutional: He appears well-developed and well-nourished.  HENT:  Head: Normocephalic and atraumatic.  Right Ear: Tympanic membrane and external ear normal. No decreased hearing is noted.  Left Ear: Tympanic membrane and external ear normal. No decreased hearing is noted.  Mouth/Throat: No oropharyngeal exudate or posterior oropharyngeal erythema.  Eyes: Pupils are equal, round, and reactive to light.  Neck: Normal range of motion. Neck supple.  Cardiovascular: Normal rate and regular rhythm.   No murmur  heard. Pulmonary/Chest: Breath sounds normal. No respiratory distress.  Abdominal: Soft. Bowel sounds are normal. He exhibits no mass. There is no tenderness.  Vitals reviewed.   Lab Results  Component Value Date   HGBA1C 7.5 (H) 11/04/2015   HGBA1C 6.4 (H) 05/27/2012    Lab Results  Component Value Date   WBC 11.5 (H) 11/04/2015   HGB 14.6 06/27/2013   HCT 48.0 11/04/2015   PLT 306 11/04/2015   GLUCOSE 121 (H) 11/04/2015   CHOL 143 11/04/2015   TRIG 141 11/04/2015   HDL 37 (L) 11/04/2015   LDLCALC 78 11/04/2015   ALT 88 (H) 11/04/2015   AST 61 (H) 11/04/2015   NA 141 11/04/2015   K 4.9 11/04/2015   CL 97 11/04/2015   CREATININE 0.98 11/04/2015   BUN 13 11/04/2015   CO2 26 11/04/2015   TSH 4.200 06/27/2013   PSA 0.8 06/27/2013   INR 1.09 05/28/2012   HGBA1C 7.5 (H) 11/04/2015     Assessment & Plan:   Brian Roach was seen today for diabetes.  Diagnoses and all orders for this visit:  Uncontrolled type 2 diabetes mellitus without complication, without long-term current use of insulin (HCC)  HTN (hypertension), benign  Class 3 obesity due to excess calories with serious comorbidity and body mass index (BMI) of 40.0 to 44.9 in adult Brian Roach)    I am having [name] maintain his aspirin, dicyclomine, atorvastatin, mometasone, diltiazem, blood glucose meter kit and supplies, metFORMIN, diclofenac, cyclobenzaprine, and benazepril.  No orders of the defined types were placed in this encounter.  Needs appointment with Brian Roach, diabetes educator.  Continue meds as is. Handouts for diabetes and carb counting, diabetes and exercise, diabetes and foot care given today.  Glucose log sheets given and encouraged to check twice a day.  MiraLAX when necessary constipation  Follow-up: Return in about 3 months (around 03/05/2016).  Brian Roach, M.D.

## 2015-12-04 NOTE — Patient Instructions (Signed)
Diabetes and Foot Care Diabetes may cause you to have problems because of poor blood supply (circulation) to your feet and legs. This may cause the skin on your feet to become thinner, break easier, and heal more slowly. Your skin may become dry, and the skin may peel and crack. You may also have nerve damage in your legs and feet causing decreased feeling in them. You may not notice minor injuries to your feet that could lead to infections or more serious problems. Taking care of your feet is one of the most important things you can do for yourself.  HOME CARE INSTRUCTIONS  Wear shoes at all times, even in the house. Do not go barefoot. Bare feet are easily injured.  Check your feet daily for blisters, cuts, and redness. If you cannot see the bottom of your feet, use a mirror or ask someone for help.  Wash your feet with warm water (do not use hot water) and mild soap. Then pat your feet and the areas between your toes until they are completely dry. Do not soak your feet as this can dry your skin.  Apply a moisturizing lotion or petroleum jelly (that does not contain alcohol and is unscented) to the skin on your feet and to dry, brittle toenails. Do not apply lotion between your toes.  Trim your toenails straight across. Do not dig under them or around the cuticle. File the edges of your nails with an emery board or nail file.  Do not cut corns or calluses or try to remove them with medicine.  Wear clean socks or stockings every day. Make sure they are not too tight. Do not wear knee-high stockings since they may decrease blood flow to your legs.  Wear shoes that fit properly and have enough cushioning. To break in new shoes, wear them for just a few hours a day. This prevents you from injuring your feet. Always look in your shoes before you put them on to be sure there are no objects inside.  Do not cross your legs. This may decrease the blood flow to your feet.  If you find a minor scrape,  cut, or break in the skin on your feet, keep it and the skin around it clean and dry. These areas may be cleansed with mild soap and water. Do not cleanse the area with peroxide, alcohol, or iodine.  When you remove an adhesive bandage, be sure not to damage the skin around it.  If you have a wound, look at it several times a day to make sure it is healing.  Do not use heating pads or hot water bottles. They may burn your skin. If you have lost feeling in your feet or legs, you may not know it is happening until it is too late.  Make sure your health care provider performs a complete foot exam at least annually or more often if you have foot problems. Report any cuts, sores, or bruises to your health care provider immediately. SEEK MEDICAL CARE IF:   You have an injury that is not healing.  You have cuts or breaks in the skin.  You have an ingrown nail.  You notice redness on your legs or feet.  You feel burning or tingling in your legs or feet.  You have pain or cramps in your legs and feet.  Your legs or feet are numb.  Your feet always feel cold. SEEK IMMEDIATE MEDICAL CARE IF:   There is increasing redness,   swelling, or pain in or around a wound.  There is a red line that goes up your leg.  Pus is coming from a wound.  You develop a fever or as directed by your health care provider.  You notice a bad smell coming from an ulcer or wound.   This information is not intended to replace advice given to you by your health care provider. Make sure you discuss any questions you have with your health care provider.   Document Released: 02/13/2000 Document Revised: 10/18/2012 Document Reviewed: 07/25/2012 Elsevier Interactive Patient Education 2016 [employer] Diabetes and Exercise Exercising regularly is important. It is not just about losing weight. It has many health benefits, such as:  Improving your overall fitness, flexibility, and endurance.  Increasing your bone  density.  Helping with weight control.  Decreasing your body fat.  Increasing your muscle strength.  Reducing stress and tension.  Improving your overall health. People with diabetes who exercise gain additional benefits because exercise:  Reduces appetite.  Improves the body's use of blood sugar (glucose).  Helps lower or control blood glucose.  Decreases blood pressure.  Helps control blood lipids (such as cholesterol and triglycerides).  Improves the body's use of the hormone insulin by:  Increasing the body's insulin sensitivity.  Reducing the body's insulin needs.  Decreases the risk for heart disease because exercising:  Lowers cholesterol and triglycerides levels.  Increases the levels of good cholesterol (such as high-density lipoproteins [HDL]) in the body.  Lowers blood glucose levels. YOUR ACTIVITY PLAN  Choose an activity that you enjoy, and set realistic goals. To exercise safely, you should begin practicing any new physical activity slowly, and gradually increase the intensity of the exercise over time. Your health care provider or diabetes educator can help create an activity plan that works for you. General recommendations include:  Encouraging children to engage in at least 60 minutes of physical activity each day.  Stretching and performing strength training exercises, such as yoga or weight lifting, at least 2 times per week.  Performing a total of at least 150 minutes of moderate-intensity exercise each week, such as brisk walking or water aerobics.  Exercising at least 3 days per week, making sure you allow no more than 2 consecutive days to pass without exercising.  Avoiding long periods of inactivity (90 minutes or more). When you have to spend an extended period of time sitting down, take frequent breaks to walk or stretch. RECOMMENDATIONS FOR EXERCISING WITH TYPE 1 OR TYPE 2 DIABETES   Check your blood glucose before exercising. If blood  glucose levels are greater than 240 mg/dL, check for urine ketones. Do not exercise if ketones are present.  Avoid injecting insulin into areas of the body that are going to be exercised. For example, avoid injecting insulin into:  The arms when playing tennis.  The legs when jogging.  Keep a record of:  Food intake before and after you exercise.  Expected peak times of insulin action.  Blood glucose levels before and after you exercise.  The type and amount of exercise you have done.  Review your records with your health care provider. Your health care provider will help you to develop guidelines for adjusting food intake and insulin amounts before and after exercising.  If you take insulin or oral hypoglycemic agents, watch for signs and symptoms of hypoglycemia. They include:  Dizziness.  Shaking.  Sweating.  Chills.  Confusion.  Drink plenty of water while you exercise to prevent   dehydration or heat stroke. Body water is lost during exercise and must be replaced.  Talk to your health care provider before starting an exercise program to make sure it is safe for you. Remember, almost any type of activity is better than none.   This information is not intended to replace advice given to you by your health care provider. Make sure you discuss any questions you have with your health care provider.   Document Released: 05/08/2003 Document Revised: 07/02/2014 Document Reviewed: 07/25/2012 Elsevier Interactive Patient Education 2016 [employer] Basic Carbohydrate Counting for Diabetes Mellitus Carbohydrate counting is a method for keeping track of the amount of carbohydrates you eat. Eating carbohydrates naturally increases the level of sugar (glucose) in your blood, so it is important for you to know the amount that is okay for you to have in every meal. Carbohydrate counting helps keep the level of glucose in your blood within normal limits. The amount of carbohydrates  allowed is different for every person. A dietitian can help you calculate the amount that is right for you. Once you know the amount of carbohydrates you can have, you can count the carbohydrates in the foods you want to eat. Carbohydrates are found in the following foods:  Grains, such as breads and cereals.  Dried beans and soy products.  Starchy vegetables, such as potatoes, peas, and corn.  Fruit and fruit juices.  Milk and yogurt.  Sweets and snack foods, such as cake, cookies, candy, chips, soft drinks, and fruit drinks. CARBOHYDRATE COUNTING There are two ways to count the carbohydrates in your food. You can use either of the methods or a combination of both. Reading the "Nutrition Facts" on Packaged Food The "Nutrition Facts" is an area that is included on the labels of almost all packaged food and beverages in the [place]. It includes the serving size of that food or beverage and information about the nutrients in each serving of the food, including the grams (g) of carbohydrate per serving.  Decide the number of servings of this food or beverage that you will be able to eat or drink. Multiply that number of servings by the number of grams of carbohydrate that is listed on the label for that serving. The total will be the amount of carbohydrates you will be having when you eat or drink this food or beverage. Learning Standard Serving Sizes of Food When you eat food that is not packaged or does not include "Nutrition Facts" on the label, you need to measure the servings in order to count the amount of carbohydrates.A serving of most carbohydrate-rich foods contains about 15 g of carbohydrates. The following list includes serving sizes of carbohydrate-rich foods that provide 15 g ofcarbohydrate per serving:   1 slice of bread (1 oz) or 1 six-inch tortilla.    of a hamburger bun or English muffin.  4-6 crackers.   cup unsweetened dry cereal.    cup hot cereal.    cup rice or pasta.    cup mashed potatoes or  of a large baked potato.  1 cup fresh fruit or one small piece of fruit.    cup canned or frozen fruit or fruit juice.  1 cup milk.   cup plain fat-free yogurt or yogurt sweetened with artificial sweeteners.   cup cooked dried beans or starchy vegetable, such as peas, corn, or potatoes.  Decide the number of standard-size servings that you will eat. Multiply that number of servings by 15 (the grams   of carbohydrates in that serving). For example, if you eat 2 cups of strawberries, you will have eaten 2 servings and 30 g of carbohydrates (2 servings x 15 g = 30 g). For foods such as soups and casseroles, in which more than one food is mixed in, you will need to count the carbohydrates in each food that is included. EXAMPLE OF CARBOHYDRATE COUNTING Sample Dinner  3 oz chicken breast.   cup of brown rice.   cup of corn.  1 cup milk.   1 cup strawberries with sugar-free whipped topping.  Carbohydrate Calculation Step 1: Identify the foods that contain carbohydrates:   Rice.   Corn.   Milk.   Strawberries. Step 2:Calculate the number of servings eaten of each:   2 servings of rice.   1 serving of corn.   1 serving of milk.   1 serving of strawberries. Step 3: Multiply each of those number of servings by 15 g:   2 servings of rice x 15 g = 30 g.   1 serving of corn x 15 g = 15 g.   1 serving of milk x 15 g = 15 g.   1 serving of strawberries x 15 g = 15 g. Step 4: Add together all of the amounts to find the total grams of carbohydrates eaten: 30 g + 15 g + 15 g + 15 g = 75 g.   This information is not intended to replace advice given to you by your health care provider. Make sure you discuss any questions you have with your health care provider.   Document Released: 02/15/2005 Document Revised: 03/08/2014 Document Reviewed: 01/12/2013 Elsevier Interactive Patient Education 2016 [employer] Inc[employer]  

## 2015-12-15 ENCOUNTER — Other Ambulatory Visit: Payer: Self-pay

## 2015-12-15 MED ORDER — METFORMIN HCL ER 500 MG PO TB24: 500.0000 mg | ORAL_TABLET | Freq: Every day | ORAL | 0 refills | Status: DC

## 2015-12-27 ENCOUNTER — Other Ambulatory Visit: Payer: Self-pay | Admitting: Family Medicine

## 2016-03-02 ENCOUNTER — Ambulatory Visit (INDEPENDENT_AMBULATORY_CARE_PROVIDER_SITE_OTHER): Payer: BC Managed Care – PPO

## 2016-03-02 ENCOUNTER — Encounter: Payer: Self-pay | Admitting: Family Medicine

## 2016-03-02 ENCOUNTER — Ambulatory Visit (INDEPENDENT_AMBULATORY_CARE_PROVIDER_SITE_OTHER): Payer: BC Managed Care – PPO | Admitting: Family Medicine

## 2016-03-02 VITALS — BP 127/83 | HR 76 | Temp 97.6°F | Ht 70.0 in | Wt 280.0 lb

## 2016-03-02 DIAGNOSIS — S39012A Strain of muscle, fascia and tendon of lower back, initial encounter: Secondary | ICD-10-CM

## 2016-03-02 DIAGNOSIS — M5431 Sciatica, right side: Secondary | ICD-10-CM

## 2016-03-02 DIAGNOSIS — E1165 Type 2 diabetes mellitus with hyperglycemia: Secondary | ICD-10-CM

## 2016-03-02 DIAGNOSIS — I10 Essential (primary) hypertension: Secondary | ICD-10-CM | POA: Diagnosis not present

## 2016-03-02 DIAGNOSIS — IMO0001 Reserved for inherently not codable concepts without codable children: Secondary | ICD-10-CM

## 2016-03-02 DIAGNOSIS — E7800 Pure hypercholesterolemia, unspecified: Secondary | ICD-10-CM | POA: Diagnosis not present

## 2016-03-02 LAB — URINALYSIS
BILIRUBIN UA: NEGATIVE
GLUCOSE, UA: NEGATIVE
Nitrite, UA: NEGATIVE
Protein, UA: NEGATIVE
RBC UA: NEGATIVE
Specific Gravity, UA: 1.02 (ref 1.005–1.030)
UUROB: 1 mg/dL (ref 0.2–1.0)
pH, UA: 5.5 (ref 5.0–7.5)

## 2016-03-02 LAB — BAYER DCA HB A1C WAIVED: HB A1C: 6.3 % (ref <7.0)

## 2016-03-02 MED ORDER — CYCLOBENZAPRINE HCL 10 MG PO TABS: 10.0000 mg | ORAL_TABLET | Freq: Three times a day (TID) | ORAL | 1 refills | Status: DC | PRN

## 2016-03-02 MED ORDER — BETAMETHASONE SOD PHOS & ACET 6 (3-3) MG/ML IJ SUSP
6.0000 mg | Freq: Once | INTRAMUSCULAR | Status: AC
  Administered 2016-03-02: 6 mg via INTRAMUSCULAR

## 2016-03-02 MED ORDER — DICLOFENAC SODIUM 75 MG PO TBEC: 75.0000 mg | DELAYED_RELEASE_TABLET | Freq: Two times a day (BID) | ORAL | 2 refills | Status: DC

## 2016-03-02 NOTE — Patient Instructions (Signed)

## 2016-03-02 NOTE — Progress Notes (Signed)
 Subjective:  Patient ID: Brian Roach, male    DOB: 10/22/1960  Age: 55 y.o. MRN: [id number]  CC: Back Pain (pt here today with hurt back after hunting this weekend and he thinks he hurt his back straining to "cock the crossbow")   HPI Brian Roach presents for pain after trying to string his crossbow. The bow has a very strong pull to it. He felt right hip pain immediately after the attempt.  Pain persists. Hurts at right buttocks and into right lower lumbar region. 8/10.  Worse with bending and twisting. Does not radiate down the leg. Sx responded to cyclobenzaprine in the past. Has had previous lumbar spinal surgery.  Pt. Also due for recheck of blood pressure. No cardiovascular symptoms. Taking meds as directed.    History Brian Roach has a past medical history of Hyperlipidemia; Hypertension; and PE (pulmonary embolism).   He has a past surgical history that includes Back surgery; Knee surgery; Lung surgery; and Anterior cruciate ligament repair (Right).   His family history includes Heart disease in his father.He reports that he has never smoked. He has never used smokeless tobacco. He reports that he does not drink alcohol or use drugs.    ROS Review of Systems  Constitutional: Negative for chills, diaphoresis and fever.  HENT: Negative for sore throat.   Cardiovascular: Negative for chest pain.  Gastrointestinal: Negative for abdominal pain.  Musculoskeletal: Positive for arthralgias, back pain, gait problem and myalgias. Negative for neck pain.  Skin: Negative for rash.  Neurological: Positive for weakness. Negative for numbness.    Objective:  BP 127/83   Pulse 76   Temp 97.6 F (36.4 C) (Oral)   Ht 5' 10" (1.778 m)   Wt 280 lb (127 kg)   BMI 40.18 kg/m   BP Readings from Last 3 Encounters:  03/02/16 127/83  12/04/15 116/73  11/17/15 109/67    Wt Readings from Last 3 Encounters:  03/02/16 280 lb (127 kg)  12/04/15 288 lb (130.6 kg)  11/17/15 291 lb 8  oz (132.2 kg)     Physical Exam  Constitutional: He is oriented to person, place, and time. He appears well-developed and well-nourished. He appears distressed.  HENT:  Head: Normocephalic.  Eyes: Pupils are equal, round, and reactive to light.  Neck: Normal range of motion.  Cardiovascular: Normal rate, regular rhythm and normal heart sounds.   No murmur heard. Pulmonary/Chest: Effort normal and breath sounds normal.  Abdominal: There is no tenderness.  Musculoskeletal: He exhibits tenderness.  Tender at the right sciatic notch.  Neurological: He is alert and oriented to person, place, and time. He has normal reflexes.  Skin: Skin is warm and dry.  Psychiatric: His behavior is normal. Thought content normal.  Vitals reviewed.    Lab Results  Component Value Date   WBC 11.5 (H) 11/04/2015   HGB 14.6 06/27/2013   HCT 48.0 11/04/2015   PLT 306 11/04/2015   GLUCOSE 121 (H) 11/04/2015   CHOL 143 11/04/2015   TRIG 141 11/04/2015   HDL 37 (L) 11/04/2015   LDLCALC 78 11/04/2015   ALT 88 (H) 11/04/2015   AST 61 (H) 11/04/2015   NA 141 11/04/2015   K 4.9 11/04/2015   CL 97 11/04/2015   CREATININE 0.98 11/04/2015   BUN 13 11/04/2015   CO2 26 11/04/2015   TSH 4.200 06/27/2013   PSA 0.8 06/27/2013   INR 1.09 05/28/2012   HGBA1C 7.5 (H) 11/04/2015    Dg Chest 2    Subjective:  Patient ID: Brian Roach, male    DOB: 08/31/1960  Age: 55 y.o. MRN: [id number]  CC: Back Pain (pt here today with hurt back after hunting this weekend and he thinks he hurt his back straining to "cock the crossbow")   HPI Brian Roach presents for pain after trying to string his crossbow. The bow has a very strong pull to it. He felt right hip pain immediately after the attempt.  Pain persists. Hurts at right buttocks and into right lower lumbar region. 8/10.  Worse with bending and twisting. Does not radiate down the leg. Sx responded to cyclobenzaprine in the past. Has had previous lumbar spinal surgery.  Pt. Also due for recheck of blood pressure. No cardiovascular symptoms. Taking meds as directed.    History Brian Roach has a past medical history of Hyperlipidemia; Hypertension; and PE (pulmonary embolism).   He has a past surgical history that includes Back surgery; Knee surgery; Lung surgery; and Anterior cruciate ligament repair (Right).   His family history includes Heart disease in his father.He reports that he has never smoked. He has never used smokeless tobacco. He reports that he does not drink alcohol or use drugs.    ROS Review of Systems  Constitutional: Negative for chills, diaphoresis and fever.  HENT: Negative for sore throat.   Cardiovascular: Negative for chest pain.  Gastrointestinal: Negative for abdominal pain.  Musculoskeletal: Positive for arthralgias, back pain, gait problem and myalgias. Negative for neck pain.  Skin: Negative for rash.  Neurological: Positive for weakness. Negative for numbness.    Objective:  BP 127/83   Pulse 76   Temp 97.6 F (36.4 C) (Oral)   Ht 5' 10" (1.778 m)   Wt 280 lb (127 kg)   BMI 40.18 kg/m   BP Readings from Last 3 Encounters:  03/02/16 127/83  12/04/15 116/73  11/17/15 109/67    Wt Readings from Last 3 Encounters:  03/02/16 280 lb (127 kg)  12/04/15 288 lb (130.6 kg)  11/17/15 291 lb 8  oz (132.2 kg)     Physical Exam  Constitutional: He is oriented to person, place, and time. He appears well-developed and well-nourished. He appears distressed.  HENT:  Head: Normocephalic.  Eyes: Pupils are equal, round, and reactive to light.  Neck: Normal range of motion.  Cardiovascular: Normal rate, regular rhythm and normal heart sounds.   No murmur heard. Pulmonary/Chest: Effort normal and breath sounds normal.  Abdominal: There is no tenderness.  Musculoskeletal: He exhibits tenderness.  Tender at the right sciatic notch.  Neurological: He is alert and oriented to person, place, and time. He has normal reflexes.  Skin: Skin is warm and dry.  Psychiatric: His behavior is normal. Thought content normal.  Vitals reviewed.    Lab Results  Component Value Date   WBC 11.5 (H) 11/04/2015   HGB 14.6 06/27/2013   HCT 48.0 11/04/2015   PLT 306 11/04/2015   GLUCOSE 121 (H) 11/04/2015   CHOL 143 11/04/2015   TRIG 141 11/04/2015   HDL 37 (L) 11/04/2015   LDLCALC 78 11/04/2015   ALT 88 (H) 11/04/2015   AST 61 (H) 11/04/2015   NA 141 11/04/2015   K 4.9 11/04/2015   CL 97 11/04/2015   CREATININE 0.98 11/04/2015   BUN 13 11/04/2015   CO2 26 11/04/2015   TSH 4.200 06/27/2013   PSA 0.8 06/27/2013   INR 1.09 05/28/2012   HGBA1C 7.5 (H) 11/04/2015    Dg Chest 2

## 2016-03-03 LAB — LIPID PANEL
Chol/HDL Ratio: 2.6 ratio units (ref 0.0–5.0)
Cholesterol, Total: 124 mg/dL (ref 100–199)
HDL: 48 mg/dL (ref >39)
LDL Calculated: 58 mg/dL (ref 0–99)
Triglycerides: 92 mg/dL (ref 0–149)
VLDL Cholesterol Cal: 18 mg/dL (ref 5–40)

## 2016-03-03 LAB — CMP14+EGFR
ALBUMIN: 4.3 g/dL (ref 3.5–5.5)
ALK PHOS: 80 IU/L (ref 39–117)
ALT: 46 IU/L — AB (ref 0–44)
AST: 14 IU/L (ref 0–40)
Albumin/Globulin Ratio: 1.7 (ref 1.2–2.2)
BUN / CREAT RATIO: 21 — AB (ref 9–20)
BUN: 19 mg/dL (ref 6–24)
Bilirubin Total: 0.4 mg/dL (ref 0.0–1.2)
CO2: 24 mmol/L (ref 18–29)
CREATININE: 0.91 mg/dL (ref 0.76–1.27)
Calcium: 9.3 mg/dL (ref 8.7–10.2)
Chloride: 101 mmol/L (ref 96–106)
GFR calc non Af Amer: 93 mL/min/{1.73_m2} (ref >59)
GFR, EST AFRICAN AMERICAN: 108 mL/min/{1.73_m2} (ref >59)
GLUCOSE: 96 mg/dL (ref 65–99)
Globulin, Total: 2.6 g/dL (ref 1.5–4.5)
Potassium: 4.3 mmol/L (ref 3.5–5.2)
Sodium: 140 mmol/L (ref 134–144)
TOTAL PROTEIN: 6.9 g/dL (ref 6.0–8.5)

## 2016-03-03 LAB — MICROALBUMIN / CREATININE URINE RATIO
CREATININE, UR: 212.1 mg/dL
Microalb/Creat Ratio: 15.5 mg/g creat (ref 0.0–30.0)
Microalbumin, Urine: 32.9 ug/mL

## 2016-03-05 ENCOUNTER — Ambulatory Visit: Payer: BC Managed Care – PPO | Admitting: Family Medicine

## 2016-03-12 ENCOUNTER — Encounter: Payer: Self-pay | Admitting: Family Medicine

## 2016-03-12 ENCOUNTER — Ambulatory Visit (INDEPENDENT_AMBULATORY_CARE_PROVIDER_SITE_OTHER): Payer: BC Managed Care – PPO | Admitting: Family Medicine

## 2016-03-12 VITALS — BP 106/75 | HR 72 | Temp 97.7°F | Ht 70.0 in | Wt 273.0 lb

## 2016-03-12 DIAGNOSIS — E1165 Type 2 diabetes mellitus with hyperglycemia: Secondary | ICD-10-CM | POA: Diagnosis not present

## 2016-03-12 DIAGNOSIS — I10 Essential (primary) hypertension: Secondary | ICD-10-CM

## 2016-03-12 DIAGNOSIS — IMO0001 Reserved for inherently not codable concepts without codable children: Secondary | ICD-10-CM

## 2016-03-12 DIAGNOSIS — E7800 Pure hypercholesterolemia, unspecified: Secondary | ICD-10-CM | POA: Diagnosis not present

## 2016-03-12 NOTE — Progress Notes (Signed)
Subjective:  Patient ID: Brian Roach, male    DOB: 04/06/1960  Age: 56 y.o. MRN: [id number]  CC: Diabetes (pt here today for routine follow up on diabetes)   HPI Brian Roach presents for  follow-up of hypertension. Patient has no history of headache chest pain or shortness of breath or recent cough. Patient also denies symptoms of TIA such as numbness weakness lateralizing. Patient checks  blood pressure at home and has not had any elevated readings recently. Patient denies side effects from his medication. States taking it regularly.  Patient also  in for follow-up of elevated cholesterol. Doing well without complaints on current medication. Denies side effects of statin including myalgia and arthralgia and nausea. Also in today for liver function testing. Currently no chest pain, shortness of breath or other cardiovascular related symptoms noted.  Follow-up of diabetes. Patient does check blood sugar at home. Readings run between 110 and 130 fasting. 130-160 PP Patient denies symptoms such as polyuria, polydipsia, excessive hunger, nausea No significant hypoglycemic spells noted. Medications as noted below. Taking them regularly without complication/adverse reaction being reported today.    History Brian Roach has a past medical history of Hyperlipidemia; Hypertension; and PE (pulmonary embolism).   Brian Roach has a past surgical history that includes Back surgery; Knee surgery; Lung surgery; and Anterior cruciate ligament repair (Right).   His family history includes Heart disease in his father.Brian Roach reports that Brian Roach has never smoked. Brian Roach has never used smokeless tobacco. Brian Roach reports that Brian Roach does not drink alcohol or use drugs.  Current Outpatient Prescriptions on File Prior to Visit  Medication Sig Dispense Refill  . aspirin EC 81 MG EC tablet Take 1 tablet (81 mg total) by mouth daily.    Marland Kitchen. atorvastatin (LIPITOR) 20 MG tablet TAKE 1 TABLET (20 MG TOTAL) BY MOUTH DAILY. 90 tablet 1  .  benazepril (LOTENSIN) 20 MG tablet TAKE 1 TABLET (20 MG TOTAL) BY MOUTH DAILY, NEEDS TO BE SEEN 90 tablet 1  . cyclobenzaprine (FLEXERIL) 10 MG tablet Take 1 tablet (10 mg total) by mouth 3 (three) times daily as needed for muscle spasms. 90 tablet 1  . diclofenac (VOLTAREN) 75 MG EC tablet Take 1 tablet (75 mg total) by mouth 2 (two) times daily. For muscle and  Joint pain 60 tablet 2  . dicyclomine (BENTYL) 20 MG tablet Take 1 tablet (20 mg total) by mouth every 6 (six) hours. 90 tablet 1  . diltiazem (CARDIZEM CD) 300 MG 24 hr capsule TAKE 1 CAPSULE (300 MG TOTAL) BY MOUTH DAILY. 90 capsule 1  . metFORMIN (GLUCOPHAGE-XR) 500 MG 24 hr tablet Take 1 tablet (500 mg total) by mouth daily with breakfast. 90 tablet 0   No current facility-administered medications on file prior to visit.     ROS Review of Systems  Constitutional: Negative for chills, diaphoresis, fever and unexpected weight change.  HENT: Negative for congestion, hearing loss, rhinorrhea and sore throat.   Eyes: Negative for visual disturbance.  Respiratory: Negative for cough and shortness of breath.   Cardiovascular: Negative for chest pain.  Gastrointestinal: Negative for abdominal pain, constipation and diarrhea.  Genitourinary: Negative for dysuria and flank pain.  Musculoskeletal: Negative for arthralgias and joint swelling.  Skin: Negative for rash.  Neurological: Negative for dizziness and headaches.  Psychiatric/Behavioral: Negative for dysphoric mood and sleep disturbance.    Objective:  BP 106/75   Pulse 72   Temp 97.7 F (36.5 C) (Oral)   Ht 5\' 10"  (1.778 m)  Wt 273 lb (123.8 kg)   BMI 39.17 kg/m   BP Readings from Last 3 Encounters:  03/12/16 106/75  03/02/16 127/83  12/04/15 116/73    Wt Readings from Last 3 Encounters:  03/12/16 273 lb (123.8 kg)  03/02/16 280 lb (127 kg)  12/04/15 288 lb (130.6 kg)     Physical Exam  Constitutional: Brian Roach is oriented to person, place, and time. Brian Roach appears  well-developed and well-nourished. No distress.  HENT:  Head: Normocephalic and atraumatic.  Right Ear: External ear normal.  Left Ear: External ear normal.  Nose: Nose normal.  Mouth/Throat: Oropharynx is clear and moist.  Eyes: Conjunctivae and EOM are normal. Pupils are equal, round, and reactive to light.  Neck: Normal range of motion. Neck supple. No thyromegaly present.  Cardiovascular: Normal rate, regular rhythm and normal heart sounds.   No murmur heard. Pulmonary/Chest: Effort normal and breath sounds normal. No respiratory distress. Brian Roach has no wheezes. Brian Roach has no rales.  Abdominal: Soft. Bowel sounds are normal. Brian Roach exhibits no distension. There is no tenderness.  Lymphadenopathy:    Brian Roach has no cervical adenopathy.  Neurological: Brian Roach is alert and oriented to person, place, and time. Brian Roach has normal reflexes.  Skin: Skin is warm and dry.  Psychiatric: Brian Roach has a normal mood and affect. His behavior is normal. Judgment and thought content normal.    No components found for: [id number]  Lab Results  Component Value Date   WBC 11.5 (H) 11/04/2015   HGB 14.6 06/27/2013   HCT 48.0 11/04/2015   PLT 306 11/04/2015   GLUCOSE 96 03/02/2016   CHOL 124 03/02/2016   TRIG 92 03/02/2016   HDL 48 03/02/2016   LDLCALC 58 03/02/2016   ALT 46 (H) 03/02/2016   AST 14 03/02/2016   NA 140 03/02/2016   K 4.3 03/02/2016   CL 101 03/02/2016   CREATININE 0.91 03/02/2016   BUN 19 03/02/2016   CO2 24 03/02/2016   TSH 4.200 06/27/2013   PSA 0.8 06/27/2013   INR 1.09 05/28/2012   HGBA1C 7.5 (H) 11/04/2015       Assessment & Plan:   Brian Roach was seen today for diabetes.  Diagnoses and all orders for this visit:  HTN (hypertension), benign  Hypercholesteremia  Uncontrolled type 2 diabetes mellitus without complication, without long-term current use of insulin (HCC)   I am having Brian Roach maintain his aspirin, dicyclomine, diltiazem, benazepril, metFORMIN, atorvastatin,  cyclobenzaprine, and diclofenac.  No orders of the defined types were placed in this encounter.    Follow-up: Return in about 3 months (around 06/10/2016).  [name], M.D.

## 2016-03-19 ENCOUNTER — Other Ambulatory Visit: Payer: Self-pay | Admitting: Family Medicine

## 2016-03-23 ENCOUNTER — Encounter: Payer: Self-pay | Admitting: Family Medicine

## 2016-05-07 ENCOUNTER — Other Ambulatory Visit: Payer: Self-pay | Admitting: Family Medicine

## 2016-06-11 ENCOUNTER — Ambulatory Visit: Payer: BC Managed Care – PPO | Admitting: Family Medicine

## 2016-06-17 ENCOUNTER — Other Ambulatory Visit: Payer: Self-pay | Admitting: *Deleted

## 2016-06-17 ENCOUNTER — Ambulatory Visit (INDEPENDENT_AMBULATORY_CARE_PROVIDER_SITE_OTHER): Payer: BC Managed Care – PPO | Admitting: Family Medicine

## 2016-06-17 ENCOUNTER — Encounter: Payer: Self-pay | Admitting: Family Medicine

## 2016-06-17 VITALS — BP 117/81 | HR 77 | Temp 98.3°F | Ht 70.0 in | Wt 258.0 lb

## 2016-06-17 DIAGNOSIS — I10 Essential (primary) hypertension: Secondary | ICD-10-CM | POA: Diagnosis not present

## 2016-06-17 DIAGNOSIS — E119 Type 2 diabetes mellitus without complications: Secondary | ICD-10-CM

## 2016-06-17 DIAGNOSIS — B351 Tinea unguium: Secondary | ICD-10-CM

## 2016-06-17 DIAGNOSIS — F5101 Primary insomnia: Secondary | ICD-10-CM

## 2016-06-17 DIAGNOSIS — E7800 Pure hypercholesterolemia, unspecified: Secondary | ICD-10-CM | POA: Diagnosis not present

## 2016-06-17 LAB — BAYER DCA HB A1C WAIVED: HB A1C: 6 % (ref <7.0)

## 2016-06-17 MED ORDER — TRAZODONE HCL 150 MG PO TABS: ORAL_TABLET | ORAL | 5 refills | Status: DC

## 2016-06-17 MED ORDER — TERBINAFINE HCL 250 MG PO TABS
250.0000 mg | ORAL_TABLET | Freq: Every day | ORAL | 2 refills | Status: DC
Start: 2016-06-17 — End: 2016-12-17

## 2016-06-17 NOTE — Progress Notes (Signed)
Subjective:  Patient ID: Brian Roach, male    DOB: 06/22/1960  Age: 56 y.o. MRN: [id number]  CC: Hypertension (pt here today for routine follow up on chronic medical conditions. No other concerns voiced.)   HPI Brian Roach presents for  follow-up of hypertension. Patient has no history of headache chest pain or shortness of breath or recent cough. Patient also denies symptoms of TIA such as numbness weakness lateralizing. Patient checks  blood pressure at home and has not had any elevated readings recently. Patient denies side effects from medication. States taking it regularly.  Follow-up of diabetes. Patient does not check blood sugar at home Patient denies symptoms such as polyuria, polydipsia, excessive hunger, nausea No significant hypoglycemic spells noted. Medications as noted below. Taking them regularly without complication/adverse reaction being reported today.   Patient in for follow-up of elevated cholesterol. Doing well without complaints on current medication. Denies side effects of statin including myalgia and arthralgia and nausea. Also in today for liver function testing. Currently no chest pain, shortness of breath or other cardiovascular related symptoms noted.   History Brian Roach has a past medical history of Hyperlipidemia; Hypertension; and PE (pulmonary embolism).   Brian Roach has a past surgical history that includes Back surgery; Knee surgery; Lung surgery; and Anterior cruciate ligament repair (Right).   His family history includes Heart disease in his father.Brian Roach reports that Brian Roach has never smoked. Brian Roach has never used smokeless tobacco. Brian Roach reports that Brian Roach does not drink alcohol or use drugs.  Current Outpatient Prescriptions on File Prior to Visit  Medication Sig Dispense Refill  . aspirin EC 81 MG EC tablet Take 1 tablet (81 mg total) by mouth daily.    Marland Kitchen atorvastatin (LIPITOR) 20 MG tablet TAKE 1 TABLET (20 MG TOTAL) BY MOUTH DAILY. 90 tablet 1  . benazepril  (LOTENSIN) 20 MG tablet TAKE 1 TABLET (20 MG TOTAL) BY MOUTH DAILY, NEEDS TO BE SEEN 90 tablet 0  . cyclobenzaprine (FLEXERIL) 10 MG tablet Take 1 tablet (10 mg total) by mouth 3 (three) times daily as needed for muscle spasms. 90 tablet 1  . diltiazem (CARDIZEM CD) 300 MG 24 hr capsule TAKE 1 CAPSULE (300 MG TOTAL) BY MOUTH DAILY. 90 capsule 0  . metFORMIN (GLUCOPHAGE-XR) 500 MG 24 hr tablet TAKE 1 TABLET (500 MG TOTAL) BY MOUTH DAILY WITH BREAKFAST. 90 tablet 3  . dicyclomine (BENTYL) 20 MG tablet Take 1 tablet (20 mg total) by mouth every 6 (six) hours. (Patient not taking: Reported on 06/17/2016) 90 tablet 1   No current facility-administered medications on file prior to visit.     ROS Review of Systems  Constitutional: Negative for chills, diaphoresis, fever and unexpected weight change.  HENT: Negative for congestion, hearing loss, rhinorrhea and sore throat.   Eyes: Negative for visual disturbance.  Respiratory: Negative for cough and shortness of breath.   Cardiovascular: Negative for chest pain.  Gastrointestinal: Negative for abdominal pain, constipation and diarrhea.  Genitourinary: Negative for dysuria and flank pain.  Musculoskeletal: Negative for arthralgias and joint swelling.  Skin: Negative for rash.  Neurological: Negative for dizziness and headaches.  Psychiatric/Behavioral: Positive for sleep disturbance (needs sleep aid due to insomnia). Negative for dysphoric mood.    Objective:  BP 117/81   Pulse 77   Temp 98.3 F (36.8 C) (Oral)   Ht '5\' 10"'$  (1.778 m)   Wt 258 lb (117 kg)   BMI 37.02 kg/m   BP Readings from Last 3 Encounters:  06/17/16 117/81  03/12/16 106/75  03/02/16 127/83    Wt Readings from Last 3 Encounters:  06/17/16 258 lb (117 kg)  03/12/16 273 lb (123.8 kg)  03/02/16 280 lb (127 kg)     Physical Exam  Constitutional: Brian Roach is oriented to person, place, and time. Brian Roach appears well-developed and well-nourished. No distress.  HENT:  Head:  Normocephalic and atraumatic.  Right Ear: External ear normal.  Left Ear: External ear normal.  Nose: Nose normal.  Mouth/Throat: Oropharynx is clear and moist.  Eyes: Conjunctivae and EOM are normal. Pupils are equal, round, and reactive to light.  Neck: Normal range of motion. Neck supple. No thyromegaly present.  Cardiovascular: Normal rate, regular rhythm and normal heart sounds.   No murmur heard. Pulmonary/Chest: Effort normal and breath sounds normal. No respiratory distress. Brian Roach has no wheezes. Brian Roach has no rales.  Abdominal: Soft. Bowel sounds are normal. Brian Roach exhibits no distension. There is no tenderness.  Lymphadenopathy:    Brian Roach has no cervical adenopathy.  Neurological: Brian Roach is alert and oriented to person, place, and time. Brian Roach has normal reflexes.  Skin: Skin is warm and dry.  Toenails thick  Psychiatric: Brian Roach has a normal mood and affect. His behavior is normal. Judgment and thought content normal.   Diabetic Foot Exam - Simple   Simple Foot Form  06/17/2016  9:00 AM  Visual Inspection No deformities, no ulcerations, no other skin breakdown bilaterally:  Yes Sensation Testing Intact to touch and monofilament testing bilaterally:  Yes Pulse Check Posterior Tibialis and Dorsalis pulse intact bilaterally:  Yes Comments      Lab Results  Component Value Date   WBC 11.5 (H) 11/04/2015   HGB 14.6 06/27/2013   HCT 48.0 11/04/2015   PLT 306 11/04/2015   GLUCOSE 107 (H) 06/17/2016   CHOL 121 06/17/2016   TRIG 92 06/17/2016   HDL 37 (L) 06/17/2016   LDLCALC 66 06/17/2016   ALT 34 06/17/2016   AST 29 06/17/2016   NA 140 06/17/2016   K 5.0 06/17/2016   CL 100 06/17/2016   CREATININE 1.07 06/17/2016   BUN 15 06/17/2016   CO2 25 06/17/2016   TSH 4.200 06/27/2013   PSA 0.8 06/27/2013   INR 1.09 05/28/2012   HGBA1C 7.5 (H) 11/04/2015    Dg Chest 2 View  Result Date: 05/27/2012 *RADIOLOGY REPORT* Clinical Data: Shoulder pain and neck pain. CHEST - 2 VIEW Comparison:  10/16/2003 Findings: Two views of the chest demonstrate stable elevation of the right hemidiaphragm and chronic densities along the right lateral ribs.  Otherwise, the lungs are clear.  Heart and mediastinum are stable.  Trachea is midline.  No evidence for pleural effusions. IMPRESSION: No acute chest findings. Original Report Authenticated By: Markus Daft, M.D.    Assessment & Plan:   Denzal was seen today for hypertension.  Diagnoses and all orders for this visit:  HTN (hypertension), benign -     CMP14+EGFR  Hypercholesteremia -     Lipid panel -     CMP14+EGFR  Diabetes mellitus without complication (HCC) -     Bayer DCA Hb A1c Waived -     Lipid panel -     CMP14+EGFR  Onychomycosis -     CMP14+EGFR -     terbinafine (LAMISIL) 250 MG tablet; Take 1 tablet (250 mg total) by mouth daily.  Primary insomnia -     traZODone (DESYREL) 150 MG tablet; Use from 1/3 to 1 tablet nightly as needed for sleep.  I am having Brian Roach start on traZODone and terbinafine. I am also having him maintain his aspirin, dicyclomine, atorvastatin, cyclobenzaprine, metFORMIN, diltiazem, and benazepril.  Meds ordered this encounter  Medications  . traZODone (DESYREL) 150 MG tablet    Sig: Use from 1/3 to 1 tablet nightly as needed for sleep.    Dispense:  30 tablet    Refill:  5  . terbinafine (LAMISIL) 250 MG tablet    Sig: Take 1 tablet (250 mg total) by mouth daily.    Dispense:  30 tablet    Refill:  2    Emphasized the need for the patient to follow his diabetes at home. Discussed dietary compliance along with regular exercise. Regular foot exam.  Follow-up: Return in about 3 months (around 09/16/2016).  Claretta Fraise, M.D.

## 2016-06-18 LAB — LIPID PANEL
CHOLESTEROL TOTAL: 121 mg/dL (ref 100–199)
Chol/HDL Ratio: 3.3 ratio (ref 0.0–5.0)
HDL: 37 mg/dL — AB (ref >39)
LDL CALC: 66 mg/dL (ref 0–99)
Triglycerides: 92 mg/dL (ref 0–149)
VLDL Cholesterol Cal: 18 mg/dL (ref 5–40)

## 2016-06-18 LAB — CMP14+EGFR
ALBUMIN: 4.6 g/dL (ref 3.5–5.5)
ALT: 34 IU/L (ref 0–44)
AST: 29 IU/L (ref 0–40)
Albumin/Globulin Ratio: 1.8 (ref 1.2–2.2)
Alkaline Phosphatase: 75 IU/L (ref 39–117)
BUN / CREAT RATIO: 14 (ref 9–20)
BUN: 15 mg/dL (ref 6–24)
Bilirubin Total: 0.6 mg/dL (ref 0.0–1.2)
CALCIUM: 9.6 mg/dL (ref 8.7–10.2)
CO2: 25 mmol/L (ref 18–29)
CREATININE: 1.07 mg/dL (ref 0.76–1.27)
Chloride: 100 mmol/L (ref 96–106)
GFR calc Af Amer: 89 mL/min/{1.73_m2} (ref >59)
GFR, EST NON AFRICAN AMERICAN: 77 mL/min/{1.73_m2} (ref >59)
GLOBULIN, TOTAL: 2.6 g/dL (ref 1.5–4.5)
Glucose: 107 mg/dL — ABNORMAL HIGH (ref 65–99)
Potassium: 5 mmol/L (ref 3.5–5.2)
SODIUM: 140 mmol/L (ref 134–144)
Total Protein: 7.2 g/dL (ref 6.0–8.5)

## 2016-06-18 MED ORDER — DICLOFENAC SODIUM 75 MG PO TBEC: 75.0000 mg | DELAYED_RELEASE_TABLET | Freq: Two times a day (BID) | ORAL | 3 refills | Status: DC

## 2016-07-17 ENCOUNTER — Other Ambulatory Visit: Payer: Self-pay | Admitting: Family Medicine

## 2016-07-28 ENCOUNTER — Ambulatory Visit: Payer: BC Managed Care – PPO | Admitting: Pediatrics

## 2016-07-28 ENCOUNTER — Ambulatory Visit (INDEPENDENT_AMBULATORY_CARE_PROVIDER_SITE_OTHER): Payer: BC Managed Care – PPO | Admitting: Family Medicine

## 2016-07-28 ENCOUNTER — Encounter: Payer: Self-pay | Admitting: Family Medicine

## 2016-07-28 VITALS — BP 124/80 | HR 91 | Temp 97.1°F | Ht 70.0 in | Wt 261.0 lb

## 2016-07-28 DIAGNOSIS — M5416 Radiculopathy, lumbar region: Secondary | ICD-10-CM

## 2016-07-28 MED ORDER — BETAMETHASONE SOD PHOS & ACET 6 (3-3) MG/ML IJ SUSP
6.0000 mg | Freq: Once | INTRAMUSCULAR | Status: AC
  Administered 2016-07-28: 6 mg via INTRAMUSCULAR

## 2016-07-28 MED ORDER — DICLOFENAC SODIUM 75 MG PO TBEC: 75.0000 mg | DELAYED_RELEASE_TABLET | Freq: Two times a day (BID) | ORAL | 2 refills | Status: DC

## 2016-07-28 MED ORDER — CYCLOBENZAPRINE HCL 10 MG PO TABS: 10.0000 mg | ORAL_TABLET | Freq: Three times a day (TID) | ORAL | 1 refills | Status: DC | PRN

## 2016-07-28 NOTE — Progress Notes (Signed)
Subjective:  Patient ID: Brian Roach, male    DOB: 02/22/1961  Age: 56 y.o. MRN: [id number]  CC: Back Pain (pt here today c/o lower back pain especially on the right side)   HPI Brian BuffWilliam F Roach presents for Has baseline back pain all the time. He had 2 back surgeries when he was younger. D ongoing pain was tolerable but yesterday there was an acute increase in his pain. He did not have any injury. This pain is much more severe 7-8/10. It does have radiation to the right lateral high. Does not go beyond the knee. It didn't prevent her from sleeping last night. He tossed and turned in pain all night. He says ibuprofen did not give him any relief when he tried it last night. He says that it may have been brought on somewhat bowel of the rain we've had lately and that he hasn't been able to get out and stay busy and exercise as usual. He has been sitting around more.  History Brian Roach has a past medical history of Hyperlipidemia; Hypertension; and PE (pulmonary embolism).   He has a past surgical history that includes Back surgery; Knee surgery; Lung surgery; and Anterior cruciate ligament repair (Right).   His family history includes Heart disease in his father.He reports that he has never smoked. He has never used smokeless tobacco. He reports that he does not drink alcohol or use drugs.  Current Outpatient Prescriptions on File Prior to Visit  Medication Sig Dispense Refill  . aspirin EC 81 MG EC tablet Take 1 tablet (81 mg total) by mouth daily.    Marland Kitchen. atorvastatin (LIPITOR) 20 MG tablet TAKE 1 TABLET EVERY EVENING 90 tablet 1  . benazepril (LOTENSIN) 20 MG tablet TAKE 1 TABLET (20 MG TOTAL) BY MOUTH DAILY, NEEDS TO BE SEEN 90 tablet 0  . cyclobenzaprine (FLEXERIL) 10 MG tablet Take 1 tablet (10 mg total) by mouth 3 (three) times daily as needed for muscle spasms. 90 tablet 1  . diclofenac (VOLTAREN) 75 MG EC tablet Take 1 tablet (75 mg total) by mouth 2 (two) times daily. For muscle and   Joint pain 180 tablet 3  . dicyclomine (BENTYL) 20 MG tablet Take 1 tablet (20 mg total) by mouth every 6 (six) hours. 90 tablet 1  . diltiazem (CARDIZEM CD) 300 MG 24 hr capsule TAKE 1 CAPSULE (300 MG TOTAL) BY MOUTH DAILY. 90 capsule 0  . metFORMIN (GLUCOPHAGE-XR) 500 MG 24 hr tablet TAKE 1 TABLET (500 MG TOTAL) BY MOUTH DAILY WITH BREAKFAST. 90 tablet 3  . terbinafine (LAMISIL) 250 MG tablet Take 1 tablet (250 mg total) by mouth daily. 30 tablet 2  . traZODone (DESYREL) 150 MG tablet Use from 1/3 to 1 tablet nightly as needed for sleep. 30 tablet 5   No current facility-administered medications on file prior to visit.     ROS Review of Systems  Constitutional: Negative for chills, diaphoresis and fever.  HENT: Negative for sore throat.   Cardiovascular: Negative for chest pain.  Gastrointestinal: Negative for abdominal pain.  Musculoskeletal: Positive for arthralgias, back pain, gait problem and myalgias. Negative for neck pain.  Skin: Negative for rash.  Neurological: Positive for weakness. Negative for numbness.    Objective:  BP 124/80   Pulse 91   Temp 97.1 F (36.2 C) (Oral)   Ht 5\' 10"  (1.778 m)   Wt 261 lb (118.4 kg)   BMI 37.45 kg/m   Physical Exam  Constitutional: He is oriented  to person, place, and time. He appears well-developed and well-nourished. He appears distressed.  HENT:  Head: Normocephalic.  Eyes: Pupils are equal, round, and reactive to light.  Neck: Normal range of motion.  Cardiovascular: Normal rate, regular rhythm and normal heart sounds.   No murmur heard. Pulmonary/Chest: Effort normal and breath sounds normal.  Abdominal: There is no tenderness.  Musculoskeletal: He exhibits tenderness.  Neurological: He is alert and oriented to person, place, and time. He has normal reflexes.  Skin: Skin is warm and dry.  Psychiatric: His behavior is normal. Thought content normal.  Vitals reviewed.   Assessment & Plan:   [name] was seen today for  back pain.  Diagnoses and all orders for this visit:  Acute right lumbar radiculopathy -     betamethasone acetate-betamethasone sodium phosphate (CELESTONE) injection 6 mg; Inject 1 mL (6 mg total) into the muscle once. -     Ambulatory referral to Physical Therapy  Other orders -     cyclobenzaprine (FLEXERIL) 10 MG tablet; Take 1 tablet (10 mg total) by mouth 3 (three) times daily as needed for muscle spasms. -     diclofenac (VOLTAREN) 75 MG EC tablet; Take 1 tablet (75 mg total) by mouth 2 (two) times daily. For muscle and  Joint pain   I am having [name] start on cyclobenzaprine and diclofenac. I am also having him maintain his aspirin, dicyclomine, cyclobenzaprine, metFORMIN, diltiazem, benazepril, traZODone, terbinafine, diclofenac, and atorvastatin. We will continue to administer betamethasone acetate-betamethasone sodium phosphate.  Meds ordered this encounter  Medications  . betamethasone acetate-betamethasone sodium phosphate (CELESTONE) injection 6 mg  . cyclobenzaprine (FLEXERIL) 10 MG tablet    Sig: Take 1 tablet (10 mg total) by mouth 3 (three) times daily as needed for muscle spasms.    Dispense:  90 tablet    Refill:  1  . diclofenac (VOLTAREN) 75 MG EC tablet    Sig: Take 1 tablet (75 mg total) by mouth 2 (two) times daily. For muscle and  Joint pain    Dispense:  60 tablet    Refill:  2     Follow-up: Return in about 2 weeks (around 08/11/2016).  [name], M.D.

## 2016-07-30 ENCOUNTER — Ambulatory Visit: Payer: Self-pay | Admitting: Family Medicine

## 2016-08-11 ENCOUNTER — Ambulatory Visit (INDEPENDENT_AMBULATORY_CARE_PROVIDER_SITE_OTHER): Payer: BC Managed Care – PPO | Admitting: Family Medicine

## 2016-08-11 ENCOUNTER — Encounter: Payer: Self-pay | Admitting: Family Medicine

## 2016-08-11 ENCOUNTER — Other Ambulatory Visit: Payer: Self-pay | Admitting: Family

## 2016-08-11 VITALS — BP 114/77 | HR 78 | Temp 98.2°F | Ht 70.0 in | Wt 264.0 lb

## 2016-08-11 DIAGNOSIS — I10 Essential (primary) hypertension: Secondary | ICD-10-CM | POA: Diagnosis not present

## 2016-08-11 DIAGNOSIS — Z1211 Encounter for screening for malignant neoplasm of colon: Secondary | ICD-10-CM | POA: Diagnosis not present

## 2016-08-11 DIAGNOSIS — M5416 Radiculopathy, lumbar region: Secondary | ICD-10-CM

## 2016-08-11 NOTE — Progress Notes (Signed)
Chief Complaint  Patient presents with  . Follow-up    pt here today for 2 week recheck on his back pain which has resolved     HPI  Patient presents today for recheck of back. Sx resolved. Doing exercises at home  PMH: Smoking status noted ROS: Per HPI  Objective: BP 114/77   Pulse 78   Temp 98.2 F (36.8 C) (Oral)   Ht 5\' 10"  (1.778 m)   Wt 264 lb (119.7 kg)   BMI 37.88 kg/m  Gen: NAD, alert, cooperative with exam HEENT: NCAT, EOMI, PERRL CV: RRR, good S1/S2, no murmur Resp: CTABL, no wheezes, non-labored Abd: SNTND, BS present, no guarding or organomegaly Ext: No edema, warm Neuro: Alert and oriented, No gross deficits  Assessment and plan:  1. Colon cancer screening   2. Acute right lumbar radiculopathy   3. HTN (hypertension), benign       Continue meds and treatmetns as is  Follow up as needed.  Mechele ClaudeWarren Alter Moss, MD

## 2016-08-11 NOTE — Patient Instructions (Signed)

## 2016-08-21 ENCOUNTER — Other Ambulatory Visit: Payer: Self-pay | Admitting: Family Medicine

## 2016-09-08 ENCOUNTER — Other Ambulatory Visit: Payer: Self-pay | Admitting: Family

## 2016-09-17 ENCOUNTER — Ambulatory Visit: Payer: BC Managed Care – PPO | Admitting: Family Medicine

## 2016-09-22 ENCOUNTER — Inpatient Hospital Stay (HOSPITAL_COMMUNITY)
Admission: EM | Admit: 2016-09-22 | Discharge: 2016-09-29 | DRG: 500 | Disposition: A | Discharge status: Home Health | Payer: BC Managed Care – PPO | Attending: Orthopedic Surgery | Admitting: Orthopedic Surgery

## 2016-09-22 ENCOUNTER — Emergency Department (HOSPITAL_COMMUNITY): Payer: BC Managed Care – PPO

## 2016-09-22 ENCOUNTER — Encounter (HOSPITAL_COMMUNITY): Payer: Self-pay | Admitting: *Deleted

## 2016-09-22 DIAGNOSIS — E7800 Pure hypercholesterolemia, unspecified: Secondary | ICD-10-CM | POA: Diagnosis present

## 2016-09-22 DIAGNOSIS — I2699 Other pulmonary embolism without acute cor pulmonale: Secondary | ICD-10-CM | POA: Diagnosis not present

## 2016-09-22 DIAGNOSIS — I10 Essential (primary) hypertension: Secondary | ICD-10-CM | POA: Diagnosis present

## 2016-09-22 DIAGNOSIS — R0902 Hypoxemia: Secondary | ICD-10-CM | POA: Diagnosis not present

## 2016-09-22 DIAGNOSIS — Y92009 Unspecified place in unspecified non-institutional (private) residence as the place of occurrence of the external cause: Secondary | ICD-10-CM

## 2016-09-22 DIAGNOSIS — E119 Type 2 diabetes mellitus without complications: Secondary | ICD-10-CM

## 2016-09-22 DIAGNOSIS — E785 Hyperlipidemia, unspecified: Secondary | ICD-10-CM | POA: Diagnosis present

## 2016-09-22 DIAGNOSIS — W19XXXA Unspecified fall, initial encounter: Secondary | ICD-10-CM | POA: Diagnosis not present

## 2016-09-22 DIAGNOSIS — Z86711 Personal history of pulmonary embolism: Secondary | ICD-10-CM

## 2016-09-22 DIAGNOSIS — S76112A Strain of left quadriceps muscle, fascia and tendon, initial encounter: Secondary | ICD-10-CM | POA: Diagnosis present

## 2016-09-22 DIAGNOSIS — S8990XA Unspecified injury of unspecified lower leg, initial encounter: Secondary | ICD-10-CM | POA: Diagnosis present

## 2016-09-22 DIAGNOSIS — R0602 Shortness of breath: Secondary | ICD-10-CM

## 2016-09-22 DIAGNOSIS — R0600 Dyspnea, unspecified: Secondary | ICD-10-CM | POA: Diagnosis not present

## 2016-09-22 HISTORY — DX: Type 2 diabetes mellitus without complications: E11.9

## 2016-09-22 LAB — COMPREHENSIVE METABOLIC PANEL
ALK PHOS: 70 U/L (ref 38–126)
ALT: 44 U/L (ref 17–63)
ANION GAP: 12 (ref 5–15)
AST: 37 U/L (ref 15–41)
Albumin: 4.6 g/dL (ref 3.5–5.0)
BILIRUBIN TOTAL: 1 mg/dL (ref 0.3–1.2)
BUN: 13 mg/dL (ref 6–20)
CALCIUM: 9.3 mg/dL (ref 8.9–10.3)
CO2: 22 mmol/L (ref 22–32)
Chloride: 102 mmol/L (ref 101–111)
Creatinine, Ser: 0.78 mg/dL (ref 0.61–1.24)
Glucose, Bld: 103 mg/dL — ABNORMAL HIGH (ref 65–99)
Potassium: 4.7 mmol/L (ref 3.5–5.1)
SODIUM: 136 mmol/L (ref 135–145)
TOTAL PROTEIN: 7.9 g/dL (ref 6.5–8.1)

## 2016-09-22 LAB — SURGICAL PCR SCREEN
MRSA, PCR: NEGATIVE
STAPHYLOCOCCUS AUREUS: NEGATIVE

## 2016-09-22 LAB — CBC WITH DIFFERENTIAL/PLATELET
Basophils Absolute: 0 10*3/uL (ref 0.0–0.1)
Basophils Relative: 0 %
EOS ABS: 0.3 10*3/uL (ref 0.0–0.7)
EOS PCT: 2 %
HCT: 46.6 % (ref 39.0–52.0)
HEMOGLOBIN: 15.7 g/dL (ref 13.0–17.0)
Lymphocytes Relative: 17 %
Lymphs Abs: 2.7 10*3/uL (ref 0.7–4.0)
MCH: 30.5 pg (ref 26.0–34.0)
MCHC: 33.7 g/dL (ref 30.0–36.0)
MCV: 90.7 fL (ref 78.0–100.0)
MONOS PCT: 8 %
Monocytes Absolute: 1.3 10*3/uL — ABNORMAL HIGH (ref 0.1–1.0)
NEUTROS PCT: 73 %
Neutro Abs: 11.8 10*3/uL — ABNORMAL HIGH (ref 1.7–7.7)
Platelets: 249 10*3/uL (ref 150–400)
RBC: 5.14 MIL/uL (ref 4.22–5.81)
RDW: 13.6 % (ref 11.5–15.5)
WBC: 16.1 10*3/uL — ABNORMAL HIGH (ref 4.0–10.5)

## 2016-09-22 LAB — GLUCOSE, CAPILLARY
GLUCOSE-CAPILLARY: 107 mg/dL — AB (ref 65–99)
Glucose-Capillary: 114 mg/dL — ABNORMAL HIGH (ref 65–99)

## 2016-09-22 MED ORDER — HYDROCODONE-ACETAMINOPHEN 5-325 MG PO TABS
1.0000 | ORAL_TABLET | ORAL | Status: DC | PRN
  Administered 2016-09-23: 1 via ORAL
  Filled 2016-09-22: qty 1

## 2016-09-22 MED ORDER — TERBINAFINE HCL 250 MG PO TABS
250.0000 mg | ORAL_TABLET | Freq: Every day | ORAL | Status: DC
  Administered 2016-09-24 – 2016-09-29 (×5): 250 mg via ORAL
  Filled 2016-09-22 (×12): qty 1

## 2016-09-22 MED ORDER — SODIUM CHLORIDE 0.9 % IV SOLN
INTRAVENOUS | Status: DC
  Administered 2016-09-22 – 2016-09-23 (×3): via INTRAVENOUS

## 2016-09-22 MED ORDER — ATORVASTATIN CALCIUM 20 MG PO TABS
20.0000 mg | ORAL_TABLET | Freq: Every evening | ORAL | Status: DC
  Administered 2016-09-22 – 2016-09-28 (×6): 20 mg via ORAL
  Filled 2016-09-22 (×6): qty 1

## 2016-09-22 MED ORDER — DOCUSATE SODIUM 100 MG PO CAPS
200.0000 mg | ORAL_CAPSULE | Freq: Two times a day (BID) | ORAL | Status: DC
  Administered 2016-09-22 – 2016-09-29 (×13): 200 mg via ORAL
  Filled 2016-09-22 (×13): qty 2

## 2016-09-22 MED ORDER — HEPARIN SODIUM (PORCINE) 5000 UNIT/ML IJ SOLN
5000.0000 [IU] | Freq: Three times a day (TID) | INTRAMUSCULAR | Status: AC
  Administered 2016-09-22: 5000 [IU] via SUBCUTANEOUS
  Filled 2016-09-22: qty 1

## 2016-09-22 MED ORDER — ONDANSETRON HCL 4 MG/2ML IJ SOLN: 4.0000 mg | Freq: Three times a day (TID) | INTRAMUSCULAR | Status: AC | PRN

## 2016-09-22 MED ORDER — BENAZEPRIL HCL 10 MG PO TABS
20.0000 mg | ORAL_TABLET | Freq: Every day | ORAL | Status: DC
  Administered 2016-09-22 – 2016-09-29 (×7): 20 mg via ORAL
  Filled 2016-09-22 (×7): qty 2

## 2016-09-22 MED ORDER — HEPARIN SODIUM (PORCINE) 5000 UNIT/ML IJ SOLN: 5000.0000 [IU] | Freq: Three times a day (TID) | INTRAMUSCULAR | Status: DC

## 2016-09-22 MED ORDER — CHLORHEXIDINE GLUCONATE 4 % EX LIQD: 60.0000 mL | Freq: Once | CUTANEOUS | Status: DC

## 2016-09-22 MED ORDER — HYDROMORPHONE HCL 1 MG/ML IJ SOLN
1.0000 mg | INTRAMUSCULAR | Status: DC | PRN
  Administered 2016-09-22: 1 mg via INTRAVENOUS
  Filled 2016-09-22 (×2): qty 1

## 2016-09-22 MED ORDER — SODIUM CHLORIDE 0.9 % IV SOLN: INTRAVENOUS | Status: AC

## 2016-09-22 MED ORDER — METFORMIN HCL ER 500 MG PO TB24
500.0000 mg | ORAL_TABLET | Freq: Every day | ORAL | Status: DC
  Administered 2016-09-24 – 2016-09-29 (×6): 500 mg via ORAL
  Filled 2016-09-22 (×6): qty 1

## 2016-09-22 MED ORDER — ADULT MULTIVITAMIN W/MINERALS CH
ORAL_TABLET | Freq: Every day | ORAL | Status: DC
  Administered 2016-09-24 – 2016-09-29 (×6): 1 via ORAL
  Filled 2016-09-22 (×6): qty 1

## 2016-09-22 MED ORDER — DILTIAZEM HCL ER COATED BEADS 180 MG PO CP24
300.0000 mg | ORAL_CAPSULE | Freq: Every day | ORAL | Status: DC
  Administered 2016-09-22 – 2016-09-29 (×7): 300 mg via ORAL
  Filled 2016-09-22 (×7): qty 1

## 2016-09-22 MED ORDER — ONDANSETRON HCL 4 MG/2ML IJ SOLN: 4.0000 mg | Freq: Four times a day (QID) | INTRAMUSCULAR | Status: DC | PRN

## 2016-09-22 MED ORDER — HYDROMORPHONE HCL 1 MG/ML IJ SOLN: 0.5000 mg | INTRAMUSCULAR | Status: DC | PRN

## 2016-09-22 NOTE — ED Notes (Signed)
Patient to xray at this time

## 2016-09-22 NOTE — ED Notes (Signed)
Patient requesting water to drink. Patient informed NPO at this time until MRI is read and EDP approves.

## 2016-09-22 NOTE — ED Provider Notes (Signed)
[facility] DEPT Provider Note   CSN: [id number] Arrival date & time: 09/22/16  7829     History   Chief Complaint Chief Complaint  Patient presents with  . Fall    HPI Brian Roach is a 56 y.o. male.  Patient fell and injured his left knee.   The history is provided by the patient.  Fall  This is a new problem. The current episode started 6 to 12 hours ago. The problem has been resolved. Pertinent negatives include no chest pain, no abdominal pain and no headaches. Exacerbated by: Movement. Nothing relieves the symptoms. He has tried nothing for the symptoms. The treatment provided no relief.    Past Medical History:  Diagnosis Date  . Diabetes mellitus without complication (HCC)   . Hyperlipidemia   . Hypertension   . PE (pulmonary embolism)     Patient Active Problem List   Diagnosis Date Noted  . Knee injury 09/22/2016  . Diabetes mellitus without complication (HCC) 06/17/2016  . DM (diabetes mellitus), type 2, uncontrolled (HCC) 11/17/2015  . Sciatica, right side 11/17/2015  . Allergic rhinitis 02/19/2015  . Irritable bowel syndrome 02/19/2015  . Left shoulder pain 05/27/2012  . History of pulmonary embolism 05/27/2012  . Obesity 05/27/2012  . HTN (hypertension), benign 05/27/2012  . Hypercholesteremia 05/27/2012    Past Surgical History:  Procedure Laterality Date  . ANTERIOR CRUCIATE LIGAMENT REPAIR Right   . BACK SURGERY    . KNEE SURGERY    . LUNG SURGERY     Unspecified but was done for PE. Had chest tubes. Approx 2003       Home Medications    Prior to Admission medications   Medication Sig Start Date End Date Taking? Authorizing Provider  aspirin EC 81 MG EC tablet Take 1 tablet (81 mg total) by mouth daily. 05/30/12   Gwenyth Bender, NP  atorvastatin (LIPITOR) 20 MG tablet TAKE 1 TABLET EVERY EVENING 07/19/16   [name], MD  benazepril (LOTENSIN) 20 MG tablet TAKE 1 TABLET (20 MG TOTAL) BY MOUTH DAILY, NEEDS TO BE SEEN 09/08/16    [name], MD  cyclobenzaprine (FLEXERIL) 10 MG tablet Take 1 tablet (10 mg total) by mouth 3 (three) times daily as needed for muscle spasms. 07/28/16   [name], MD  cyclobenzaprine (FLEXERIL) 10 MG tablet TAKE 1 TABLET (10 MG TOTAL) BY MOUTH 3 (THREE) TIMES DAILY AS NEEDED FOR MUSCLE SPASMS. 08/23/16   [name], MD  diclofenac (VOLTAREN) 75 MG EC tablet Take 1 tablet (75 mg total) by mouth 2 (two) times daily. For muscle and  Joint pain 07/28/16   [name], MD  dicyclomine (BENTYL) 20 MG tablet Take 1 tablet (20 mg total) by mouth every 6 (six) hours. 02/19/15   Jannifer Rodney A, FNP  diltiazem (CARDIZEM CD) 300 MG 24 hr capsule TAKE 1 CAPSULE (300 MG TOTAL) BY MOUTH DAILY. 08/11/16   [name], MD  metFORMIN (GLUCOPHAGE-XR) 500 MG 24 hr tablet TAKE 1 TABLET (500 MG TOTAL) BY MOUTH DAILY WITH BREAKFAST. 03/19/16   [name], MD  terbinafine (LAMISIL) 250 MG tablet Take 1 tablet (250 mg total) by mouth daily. 06/17/16   [name], MD  traZODone (DESYREL) 150 MG tablet Use from 1/3 to 1 tablet nightly as needed for sleep. 06/17/16   [name], MD    Family History Family History  Problem Relation Age of Onset  . Heart disease Father     Social History Social History  Substance Use Topics  . Smoking status: Never Smoker  . Smokeless tobacco: Never Used  . Alcohol use No     Allergies   Patient has no known allergies.   Review of Systems Review of Systems  Constitutional: Negative for appetite change and fatigue.  HENT: Negative for congestion, ear discharge and sinus pressure.   Eyes: Negative for discharge.  Respiratory: Negative for cough.   Cardiovascular: Negative for chest pain.  Gastrointestinal: Negative for abdominal pain and diarrhea.  Genitourinary: Negative for frequency and hematuria.  Musculoskeletal: Negative for back pain.       Knee pain  Skin: Negative for rash.  Neurological: Negative for seizures and headaches.    Psychiatric/Behavioral: Negative for hallucinations.     Physical Exam Updated Vital Signs BP 122/86   Pulse 85   Temp 97.6 F (36.4 C) (Oral)   Resp 16   Ht 5\' 10"  (1.778 m)   Wt 119.7 kg (264 lb)   SpO2 97%   BMI 37.88 kg/m   Physical Exam  Constitutional: He is oriented to person, place, and time. He appears well-developed.  HENT:  Head: Normocephalic.  Eyes: Conjunctivae and EOM are normal. No scleral icterus.  Neck: Neck supple. No thyromegaly present.  Cardiovascular: Normal rate and regular rhythm.  Exam reveals no gallop and no friction rub.   No murmur heard. Pulmonary/Chest: No stridor. He has no wheezes. He has no rales. He exhibits no tenderness.  Abdominal: He exhibits no distension. There is no tenderness. There is no rebound.  Musculoskeletal: He exhibits no edema.  Tenderness to the superior lateral patella on the left knee. Decreased range of motion  Lymphadenopathy:    He has no cervical adenopathy.  Neurological: He is oriented to person, place, and time. He exhibits normal muscle tone. Coordination normal.  Skin: No rash noted. No erythema.  Psychiatric: He has a normal mood and affect. His behavior is normal.     ED Treatments / Results  Labs (all labs ordered are listed, but only abnormal results are displayed) Labs Reviewed  CBC WITH DIFFERENTIAL/PLATELET  COMPREHENSIVE METABOLIC PANEL    EKG  EKG Interpretation None       Radiology Mr Knee Left Wo Contrast  Result Date: 09/22/2016 CLINICAL DATA:  Left knee pain after fall. EXAM: MRI OF THE LEFT KNEE WITHOUT CONTRAST TECHNIQUE: Multiplanar, multisequence MR imaging of the knee was performed. No intravenous contrast was administered. COMPARISON:  Left femur x-rays from same date. FINDINGS: MENISCI Medial meniscus:  Intact. Lateral meniscus:  Intact. LIGAMENTS Cruciates:  Intact ACL and PCL. Collaterals: Medial collateral ligament is intact. Lateral collateral ligament complex is intact.  CARTILAGE Patellofemoral: Large area of full-thickness cartilage loss overlying the deep trochlear groove. Medial:  No chondral defect. Lateral:  No chondral defect. Joint:  Small joint effusion. Popliteal Fossa:  Small Baker cyst. Intact popliteus tendon. Extensor Mechanism: The majority of the quadriceps tendon is attached to the avulsed patellar fragment. The vastus lateralis and vastus intermedius tendons remain attached to the patella. There is laxity of the patellar tendon. Bones: There is an avulsion fracture of the superior patella with approximately 10 mm of retraction. Other: None. IMPRESSION: 1. Avulsion fracture of the superior patella with approximately 10 mm of retraction. The majority of the quadriceps tendon is attached to the avulsed fragment, however the vastus lateralis and vastus intermedius tendons remain attached to the patella. 2. Mild patellofemoral compartment degenerative changes. Electronically Signed   By: Vickki HearingWilliam T Derry M.D.  On: 09/22/2016 13:05   Dg Femur Min 2 Views Left  Result Date: 09/22/2016 CLINICAL DATA:  Post fall EXAM: LEFT FEMUR 2 VIEWS COMPARISON:  None. FINDINGS: A peripherally corticated ossicle measuring approximately 1.2 cm is seen superior to the patella and likely represents the sequela of age-indeterminate avulsive injury. No associated joint effusion or lipohemarthrosis. No additional fracture. Limited visualization of the hip is normal given obliquity and large field of view. Suspected mild tricompartmental degenerative change of the knee with joint space loss, articular surface irregularity and subchondral sclerosis, incompletely evaluated. Regional soft tissues appear normal. IMPRESSION: 1. Peripherally corticated ossicle adjacent to the superior pole of the patella likely represents the sequela of age-indeterminate avulsive injury. Correlation for point tenderness at this location is recommended. 2. Otherwise, no acute findings. Electronically Signed   By:  Simonne ComeJohn  Watts M.D.   On: 09/22/2016 09:18    Procedures Procedures (including critical care time)  Medications Ordered in ED Medications - No data to display   Initial Impression / Assessment and Plan / ED Course  I have reviewed the triage vital signs and the nursing notes.  Pertinent labs & imaging results that were available during my care of the patient were reviewed by me and considered in my medical decision making (see chart for details).     Patient has an avulsion fracture to the superior part of the patella. He will be admitted by Dr. Romeo AppleHarrison and have surgery to repair this tomorrow  Final Clinical Impressions(s) / ED Diagnoses   Final diagnoses:  Fall  Knee injury, initial encounter    New Prescriptions New Prescriptions   No medications on file     Bethann BerkshireZammit, Brian Gullickson, MD 09/22/16 1348

## 2016-09-22 NOTE — ED Triage Notes (Signed)
Pt comes in by EMS for a fall. Per EMS, he is having left upper leg pain (thigh area).

## 2016-09-23 ENCOUNTER — Inpatient Hospital Stay (HOSPITAL_COMMUNITY): Payer: BC Managed Care – PPO | Admitting: Anesthesiology

## 2016-09-23 ENCOUNTER — Encounter (HOSPITAL_COMMUNITY)
Admission: EM | Disposition: A | Discharge status: Home Health | Payer: Self-pay | Source: Home / Self Care | Attending: Orthopedic Surgery

## 2016-09-23 ENCOUNTER — Encounter (HOSPITAL_COMMUNITY): Payer: Self-pay | Admitting: *Deleted

## 2016-09-23 DIAGNOSIS — S76112A Strain of left quadriceps muscle, fascia and tendon, initial encounter: Secondary | ICD-10-CM | POA: Diagnosis present

## 2016-09-23 HISTORY — DX: Strain of left quadriceps muscle, fascia and tendon, initial encounter: S76.112A

## 2016-09-23 HISTORY — PX: QUADRICEPS TENDON REPAIR: SHX756

## 2016-09-23 LAB — GLUCOSE, CAPILLARY
GLUCOSE-CAPILLARY: 97 mg/dL (ref 65–99)
GLUCOSE-CAPILLARY: 99 mg/dL (ref 65–99)
GLUCOSE-CAPILLARY: 119 mg/dL — AB (ref 65–99)
Glucose-Capillary: 107 mg/dL — ABNORMAL HIGH (ref 65–99)
Glucose-Capillary: 147 mg/dL — ABNORMAL HIGH (ref 65–99)

## 2016-09-23 LAB — CBC
HEMATOCRIT: 43.2 % (ref 39.0–52.0)
Hemoglobin: 14.6 g/dL (ref 13.0–17.0)
MCH: 30.8 pg (ref 26.0–34.0)
MCHC: 33.8 g/dL (ref 30.0–36.0)
MCV: 91.1 fL (ref 78.0–100.0)
Platelets: 209 10*3/uL (ref 150–400)
RBC: 4.74 MIL/uL (ref 4.22–5.81)
RDW: 13.7 % (ref 11.5–15.5)
WBC: 15.2 10*3/uL — ABNORMAL HIGH (ref 4.0–10.5)

## 2016-09-23 LAB — CREATININE, SERUM
Creatinine, Ser: 0.78 mg/dL (ref 0.61–1.24)
GFR calc Af Amer: >60 mL/min (ref >60)

## 2016-09-23 SURGERY — REPAIR, TENDON, QUADRICEPS
Anesthesia: General | Site: Knee | Laterality: Left

## 2016-09-23 MED ORDER — BUPIVACAINE-EPINEPHRINE (PF) 0.5% -1:200000 IJ SOLN
INTRAMUSCULAR | Status: AC
  Filled 2016-09-23: qty 60

## 2016-09-23 MED ORDER — GLYCOPYRROLATE 0.2 MG/ML IJ SOLN
INTRAMUSCULAR | Status: AC
  Filled 2016-09-23: qty 3

## 2016-09-23 MED ORDER — ONDANSETRON HCL 4 MG/2ML IJ SOLN
INTRAMUSCULAR | Status: AC
  Filled 2016-09-23: qty 2

## 2016-09-23 MED ORDER — ONDANSETRON HCL 4 MG/2ML IJ SOLN
4.0000 mg | Freq: Once | INTRAMUSCULAR | Status: AC
  Administered 2016-09-23: 4 mg via INTRAVENOUS

## 2016-09-23 MED ORDER — FENTANYL CITRATE (PF) 100 MCG/2ML IJ SOLN
INTRAMUSCULAR | Status: DC | PRN
  Administered 2016-09-23 (×5): 50 ug via INTRAVENOUS

## 2016-09-23 MED ORDER — ROCURONIUM BROMIDE 100 MG/10ML IV SOLN
INTRAVENOUS | Status: DC | PRN
  Administered 2016-09-23: 5 mg via INTRAVENOUS
  Administered 2016-09-23: 30 mg via INTRAVENOUS

## 2016-09-23 MED ORDER — LACTATED RINGERS IV SOLN
INTRAVENOUS | Status: DC
  Administered 2016-09-23 (×2): via INTRAVENOUS

## 2016-09-23 MED ORDER — PROPOFOL 10 MG/ML IV BOLUS
INTRAVENOUS | Status: AC
  Filled 2016-09-23: qty 20

## 2016-09-23 MED ORDER — GLYCOPYRROLATE 0.2 MG/ML IJ SOLN
INTRAMUSCULAR | Status: DC | PRN
  Administered 2016-09-23: 0.6 mg via INTRAVENOUS

## 2016-09-23 MED ORDER — FENTANYL CITRATE (PF) 250 MCG/5ML IJ SOLN
INTRAMUSCULAR | Status: AC
  Filled 2016-09-23: qty 5

## 2016-09-23 MED ORDER — ONDANSETRON HCL 4 MG/2ML IJ SOLN
4.0000 mg | Freq: Four times a day (QID) | INTRAMUSCULAR | Status: AC | PRN
  Administered 2016-09-23: 4 mg via INTRAVENOUS
  Filled 2016-09-23: qty 2

## 2016-09-23 MED ORDER — MIDAZOLAM HCL 2 MG/2ML IJ SOLN
INTRAMUSCULAR | Status: AC
  Filled 2016-09-23: qty 2

## 2016-09-23 MED ORDER — FENTANYL CITRATE (PF) 100 MCG/2ML IJ SOLN
25.0000 ug | Freq: Once | INTRAMUSCULAR | Status: AC
  Administered 2016-09-23: 25 ug via INTRAVENOUS

## 2016-09-23 MED ORDER — OXYCODONE-ACETAMINOPHEN 5-325 MG PO TABS
1.0000 | ORAL_TABLET | ORAL | Status: DC | PRN
  Administered 2016-09-26 – 2016-09-29 (×5): 1 via ORAL
  Filled 2016-09-23 (×5): qty 1

## 2016-09-23 MED ORDER — FENTANYL CITRATE (PF) 100 MCG/2ML IJ SOLN
INTRAMUSCULAR | Status: AC
  Filled 2016-09-23: qty 2

## 2016-09-23 MED ORDER — SUCCINYLCHOLINE CHLORIDE 20 MG/ML IJ SOLN
INTRAMUSCULAR | Status: AC
  Filled 2016-09-23: qty 1

## 2016-09-23 MED ORDER — LIDOCAINE HCL (PF) 1 % IJ SOLN
INTRAMUSCULAR | Status: AC
  Filled 2016-09-23: qty 5

## 2016-09-23 MED ORDER — PROPOFOL 10 MG/ML IV BOLUS
INTRAVENOUS | Status: DC | PRN
  Administered 2016-09-23: 200 mg via INTRAVENOUS

## 2016-09-23 MED ORDER — ACETAMINOPHEN 650 MG RE SUPP: 650.0000 mg | Freq: Four times a day (QID) | RECTAL | Status: DC | PRN

## 2016-09-23 MED ORDER — BUPIVACAINE-EPINEPHRINE 0.5% -1:200000 IJ SOLN
INTRAMUSCULAR | Status: DC | PRN
  Administered 2016-09-23: 60 mL

## 2016-09-23 MED ORDER — SODIUM CHLORIDE 0.9 % IR SOLN
Status: DC | PRN
  Administered 2016-09-23: 1000 mL

## 2016-09-23 MED ORDER — NEOSTIGMINE METHYLSULFATE 10 MG/10ML IV SOLN
INTRAVENOUS | Status: AC
  Filled 2016-09-23: qty 1

## 2016-09-23 MED ORDER — HYDROMORPHONE HCL 1 MG/ML IJ SOLN
0.2500 mg | INTRAMUSCULAR | Status: DC | PRN
  Administered 2016-09-23: 0.5 mg via INTRAVENOUS
  Filled 2016-09-23: qty 1

## 2016-09-23 MED ORDER — MIDAZOLAM HCL 2 MG/2ML IJ SOLN
2.0000 mg | Freq: Once | INTRAMUSCULAR | Status: AC
  Administered 2016-09-23: 2 mg via INTRAVENOUS

## 2016-09-23 MED ORDER — MIDAZOLAM HCL 5 MG/5ML IJ SOLN
INTRAMUSCULAR | Status: DC | PRN
  Administered 2016-09-23: 2 mg via INTRAVENOUS

## 2016-09-23 MED ORDER — CEFAZOLIN SODIUM-DEXTROSE 2-4 GM/100ML-% IV SOLN
INTRAVENOUS | Status: AC
  Filled 2016-09-23: qty 100

## 2016-09-23 MED ORDER — ACETAMINOPHEN 325 MG PO TABS: 650.0000 mg | ORAL_TABLET | Freq: Four times a day (QID) | ORAL | Status: DC | PRN

## 2016-09-23 MED ORDER — PROMETHAZINE HCL 12.5 MG PO TABS
12.5000 mg | ORAL_TABLET | Freq: Four times a day (QID) | ORAL | Status: DC | PRN
  Administered 2016-09-24: 12.5 mg via ORAL
  Filled 2016-09-23 (×2): qty 1

## 2016-09-23 MED ORDER — CEFAZOLIN SODIUM-DEXTROSE 2-4 GM/100ML-% IV SOLN
2.0000 g | INTRAVENOUS | Status: AC
  Administered 2016-09-23: 2 g via INTRAVENOUS

## 2016-09-23 MED ORDER — HYDROCODONE-ACETAMINOPHEN 5-325 MG PO TABS
1.0000 | ORAL_TABLET | ORAL | Status: DC | PRN
  Administered 2016-09-23 – 2016-09-26 (×6): 2 via ORAL
  Filled 2016-09-23 (×6): qty 2

## 2016-09-23 MED ORDER — POVIDONE-IODINE 10 % EX SWAB: 2.0000 "application " | Freq: Once | CUTANEOUS | Status: DC

## 2016-09-23 MED ORDER — HYDROMORPHONE HCL 1 MG/ML IJ SOLN
0.5000 mg | INTRAMUSCULAR | Status: DC | PRN
  Administered 2016-09-23 – 2016-09-24 (×7): 1 mg via INTRAVENOUS
  Filled 2016-09-23 (×7): qty 1

## 2016-09-23 MED ORDER — MIDAZOLAM HCL 2 MG/2ML IJ SOLN
1.0000 mg | INTRAMUSCULAR | Status: DC
  Administered 2016-09-23: 2 mg via INTRAVENOUS

## 2016-09-23 MED ORDER — METOPROLOL TARTRATE 5 MG/5ML IV SOLN
INTRAVENOUS | Status: DC | PRN
  Administered 2016-09-23: 2 mg via INTRAVENOUS

## 2016-09-23 MED ORDER — NEOSTIGMINE METHYLSULFATE 10 MG/10ML IV SOLN
INTRAVENOUS | Status: DC | PRN
  Administered 2016-09-23: 2 mg via INTRAVENOUS

## 2016-09-23 MED ORDER — ROCURONIUM BROMIDE 50 MG/5ML IV SOLN
INTRAVENOUS | Status: AC
  Filled 2016-09-23: qty 1

## 2016-09-23 MED ORDER — HEPARIN SODIUM (PORCINE) 5000 UNIT/ML IJ SOLN
5000.0000 [IU] | Freq: Three times a day (TID) | INTRAMUSCULAR | Status: DC
  Administered 2016-09-23 – 2016-09-24 (×3): 5000 [IU] via SUBCUTANEOUS
  Filled 2016-09-23 (×3): qty 1

## 2016-09-23 MED ORDER — LIDOCAINE HCL 1 % IJ SOLN
INTRAMUSCULAR | Status: DC | PRN
  Administered 2016-09-23: 35 mg via INTRADERMAL

## 2016-09-23 MED ORDER — SUCCINYLCHOLINE CHLORIDE 20 MG/ML IJ SOLN
INTRAMUSCULAR | Status: DC | PRN
  Administered 2016-09-23: 180 mg via INTRAVENOUS

## 2016-09-23 SURGICAL SUPPLY — 50 items
BAG HAMPER (MISCELLANEOUS) ×3 IMPLANT
BANDAGE ELASTIC 4 VELCRO ST LF (GAUZE/BANDAGES/DRESSINGS) ×2 IMPLANT
BANDAGE ELASTIC 6 VELCRO ST LF (GAUZE/BANDAGES/DRESSINGS) ×2 IMPLANT
BANDAGE ESMARK 6X9 LF (GAUZE/BANDAGES/DRESSINGS) ×1 IMPLANT
BIT DRILL 2.8X128 (BIT) ×2 IMPLANT
BIT DRILL 2.8X128MM (BIT) ×1
BLADE 10 SAFETY STRL DISP (BLADE) ×3 IMPLANT
BNDG CMPR 9X6 STRL LF SNTH (GAUZE/BANDAGES/DRESSINGS) ×1
BNDG COHESIVE 4X5 TAN STRL (GAUZE/BANDAGES/DRESSINGS) ×3 IMPLANT
BNDG ESMARK 6X9 LF (GAUZE/BANDAGES/DRESSINGS) ×3
CHLORAPREP W/TINT 26ML (MISCELLANEOUS) ×3 IMPLANT
CLOTH BEACON ORANGE TIMEOUT ST (SAFETY) ×3 IMPLANT
COVER LIGHT HANDLE STERIS (MISCELLANEOUS) ×6 IMPLANT
CUFF TOURNIQUET SINGLE 34IN LL (TOURNIQUET CUFF) ×3 IMPLANT
GAUZE XEROFORM 5X9 LF (GAUZE/BANDAGES/DRESSINGS) ×2 IMPLANT
GLOVE BIOGEL PI IND STRL 7.0 (GLOVE) ×2 IMPLANT
GLOVE BIOGEL PI INDICATOR 7.0 (GLOVE) ×6
GLOVE SKINSENSE NS SZ8.0 LF (GLOVE) ×2
GLOVE SKINSENSE STRL SZ8.0 LF (GLOVE) ×1 IMPLANT
GLOVE SS N UNI LF 8.5 STRL (GLOVE) ×3 IMPLANT
GOWN STRL REUS W/ TWL LRG LVL3 (GOWN DISPOSABLE) ×1 IMPLANT
GOWN STRL REUS W/TWL LRG LVL3 (GOWN DISPOSABLE) ×6 IMPLANT
GOWN STRL REUS W/TWL XL LVL3 (GOWN DISPOSABLE) ×3 IMPLANT
INST SET MINOR BONE (KITS) ×3 IMPLANT
KIT ROOM TURNOVER APOR (KITS) ×3 IMPLANT
MANIFOLD NEPTUNE II (INSTRUMENTS) ×3 IMPLANT
NDL HYPO 21X1.5 SAFETY (NEEDLE) ×1 IMPLANT
NDL MAYO 6 CRC TAPER PT (NEEDLE) IMPLANT
NEEDLE HYPO 21X1.5 SAFETY (NEEDLE) ×3 IMPLANT
NEEDLE MAYO 6 CRC TAPER PT (NEEDLE) IMPLANT
NS IRRIG 1000ML POUR BTL (IV SOLUTION) ×3 IMPLANT
PACK BASIC LIMB (CUSTOM PROCEDURE TRAY) ×3 IMPLANT
PAD ABD 5X9 TENDERSORB (GAUZE/BANDAGES/DRESSINGS) ×2 IMPLANT
PAD ARMBOARD 7.5X6 YLW CONV (MISCELLANEOUS) ×3 IMPLANT
PADDING WEBRIL 4 STERILE (GAUZE/BANDAGES/DRESSINGS) ×2 IMPLANT
PADDING WEBRIL 6 STERILE (GAUZE/BANDAGES/DRESSINGS) ×2 IMPLANT
PASSER SUT SWANSON 36MM LOOP (INSTRUMENTS) ×2 IMPLANT
SET BASIN LINEN APH (SET/KITS/TRAYS/PACK) ×3 IMPLANT
SPONGE GAUZE 4X4 12PLY STER LF (GAUZE/BANDAGES/DRESSINGS) ×2 IMPLANT
SPONGE LAP 18X18 X RAY DECT (DISPOSABLE) ×4 IMPLANT
STAPLER VISISTAT 35W (STAPLE) ×3 IMPLANT
SUT BRALON NAB BRD #1 30IN (SUTURE) IMPLANT
SUT ETHIBOND 5 LR DA (SUTURE) ×5 IMPLANT
SUT ETHIBOND NAB OS 4 #2 30IN (SUTURE) ×4 IMPLANT
SUT ETHILON 3 0 FSL (SUTURE) IMPLANT
SUT MON AB 0 CT1 (SUTURE) ×5 IMPLANT
SUT MON AB 2-0 CT1 36 (SUTURE) ×3 IMPLANT
SUT PROLENE 3 0 PS 1 (SUTURE) IMPLANT
SYR 30ML LL (SYRINGE) ×3 IMPLANT
SYR BULB IRRIGATION 50ML (SYRINGE) ×5 IMPLANT

## 2016-09-23 NOTE — Transfer of Care (Signed)
Immediate Anesthesia Transfer of Care Note  Patient: Brian Roach  Procedure(s) Performed: Procedure(s): REPAIR QUADRICEP TENDON (Left)  Patient Location: PACU  Anesthesia Type:General  Level of Consciousness: awake and patient cooperative  Airway & Oxygen Therapy: Patient Spontanous Breathing and Patient connected to face mask oxygen  Post-op Assessment: Report given to RN, Post -op Vital signs reviewed and stable and Patient moving all extremities  Post vital signs: Reviewed and stable  Last Vitals:  Vitals:   09/23/16 1300 09/23/16 1305  BP: 111/71 117/73  Pulse:    Resp: 19 18  Temp:      Last Pain:  Vitals:   09/23/16 1100  TempSrc: Oral  PainSc:       Patients Stated Pain Goal: 2 (09/23/16 1200)  Complications: No apparent anesthesia complications

## 2016-09-23 NOTE — Anesthesia Procedure Notes (Signed)
Procedure Name: Intubation Date/Time: 09/23/2016 1:47 PM Performed by: Charmaine Downs Pre-anesthesia Checklist: Suction available, Emergency Drugs available, Patient identified and Patient being monitored Patient Re-evaluated:Patient Re-evaluated prior to induction Oxygen Delivery Method: Circle system utilized Preoxygenation: Pre-oxygenation with 100% oxygen Induction Type: IV induction, Rapid sequence and Cricoid Pressure applied Ventilation: Oral airway inserted - appropriate to patient size and Mask ventilation with difficulty Laryngoscope Size: Mac and 4 Grade View: Grade II Tube size: 8.0 mm Number of attempts: 1 Airway Equipment and Method: Stylet and Oral airway Placement Confirmation: ETT inserted through vocal cords under direct vision,  positive ETCO2 and breath sounds checked- equal and bilateral Secured at: 22 cm Tube secured with: Tape Dental Injury: Teeth and Oropharynx as per pre-operative assessment  Difficulty Due To: Difficulty was anticipated and Difficult Airway- due to reduced neck mobility Future Recommendations: Recommend- induction with short-acting agent, and alternative techniques readily available

## 2016-09-23 NOTE — Brief Op Note (Signed)
09/23/2016  3:23 PM  PATIENT:  Brian Roach  56 y.o. male  PRE-OPERATIVE DIAGNOSIS:  quadriceps tendon rupture left knee  POST-OPERATIVE DIAGNOSIS:  quadriceps tendon rupture left knee  FINDINGS: COMPLETE QUADRICEPS TENDON RUPTURE LEFT KNEE WITH BONY AVULSION AND RETINACULAR TEAR MEDIAL AND LATERAL   PROCEDURE:  Procedure(s): REPAIR QUADRICEP TENDON (Left)  SURGEON:  Surgeon(s) and Role:    Vickki Hearing* Bellina Tokarczyk E, MD - Primary  PHYSICIAN ASSISTANT:   ASSISTANTS: betty ashley   ANESTHESIA:   general  EBL:  Total I/O In: 1100 [I.V.:1100] Out: 50 [Blood:50]  BLOOD ADMINISTERED:none  DRAINS: none   LOCAL MEDICATIONS USED:  MARCAINE     SPECIMEN:  No Specimen  DISPOSITION OF SPECIMEN:  N/A  COUNTS:  YES  TOURNIQUET:   Total Tourniquet Time Documented: Thigh (Left) - 58 minutes Total: Thigh (Left) - 58 minutes   DICTATION: .Reubin Milanragon Dictation  PLAN OF CARE: Admit for overnight observation  PATIENT DISPOSITION:  PACU - hemodynamically stable.   Delay start of Pharmacological VTE agent (>24hrs) due to surgical blood loss or risk of bleeding: no

## 2016-09-23 NOTE — Anesthesia Preprocedure Evaluation (Signed)
Anesthesia Evaluation  Patient identified by MRN, date of birth, ID band Patient awake    Reviewed: Allergy & Precautions, NPO status , Patient's Chart, lab work & pertinent test results  Airway Mallampati: II  TM Distance: >3 FB Neck ROM: Limited    Dental  (+) Teeth Intact   Pulmonary PE   breath sounds clear to auscultation       Cardiovascular hypertension, Pt. on medications  Rhythm:Regular Rate:Normal     Neuro/Psych  Neuromuscular disease    GI/Hepatic negative GI ROS,   Endo/Other  diabetes, Type 2, Oral Hypoglycemic AgentsMorbid obesity  Renal/GU      Musculoskeletal   Abdominal   Peds  Hematology   Anesthesia Other Findings   Reproductive/Obstetrics                            Anesthesia Physical Anesthesia Plan  ASA: III  Anesthesia Plan: General   Post-op Pain Management:    Induction: Intravenous  PONV Risk Score and Plan:   Airway Management Planned: Oral ETT  Additional Equipment:   Intra-op Plan:   Post-operative Plan: Extubation in OR  Informed Consent: I have reviewed the patients History and Physical, chart, labs and discussed the procedure including the risks, benefits and alternatives for the proposed anesthesia with the patient or authorized representative who has indicated his/her understanding and acceptance.     Plan Discussed with:   Anesthesia Plan Comments:         Anesthesia Quick Evaluation

## 2016-09-23 NOTE — Op Note (Signed)
09/23/2016  3:23 PM  PATIENT:  Brian Roach  56 y.o. male  PRE-OPERATIVE DIAGNOSIS:  quadriceps tendon rupture left knee  POST-OPERATIVE DIAGNOSIS:  quadriceps tendon rupture left knee  FINDINGS: COMPLETE QUADRICEPS TENDON RUPTURE LEFT KNEE WITH BONY AVULSION AND RETINACULAR TEAR MEDIAL AND LATERAL   PROCEDURE:  Procedure(s): REPAIR QUADRICEP TENDON (Left)  SURGEON:  Surgeon(s) and Role:    Vickki Hearing* Isaish Alemu E, MD - Primary  PHYSICIAN ASSISTANT:   ASSISTANTS: betty ashley   ANESTHESIA:   general  EBL:  Total I/O In: 1100 [I.V.:1100] Out: 50 [Blood:50]  BLOOD ADMINISTERED:none  DRAINS: none   LOCAL MEDICATIONS USED:  MARCAINE     SPECIMEN:  No Specimen  DISPOSITION OF SPECIMEN:  N/A  COUNTS:  YES  TOURNIQUET:   Total Tourniquet Time Documented: Thigh (Left) - 58 minutes Total: Thigh (Left) - 58 minutes   DICTATION: .Reubin Milanragon Dictation  This procedure was done as follows it is [phone] primary repair of quadriceps tendon left knee  The surgical site was confirmed and marked in the preop area chart review was completed patient was brought back to surgery. After general anesthesia and appropriate antibiotics is left knee was prepped and draped from the foot to the tourniquet which was on the proximal area of the thigh.  After timeout the limb was exsanguinated with a six-inch Esmarch tourniquet elevated up to 300 mmHg  A brief examination of the knee revealed that it was stable in terms of ligament exam. A midline incision was made. Subcutaneous tissue was divided. The ruptured quadriceps tendon was noted with minimal retraction. The patella was debrided at the insertion site of the quadriceps to a bleeding bony bed. 3 drill holes were passed through the patella. 2 #5 Ethibond sutures were passed through the quadriceps tendon in running interlocking fashion and then the 4 ends were brought through the drill holes through the patella.  These were tied with the knee  in extension. Reexamination of the knee revealed that the knee was brought to 80 of flexion without any tension on the repair  We will therefore be able to start range of motion from 0-50 without much worry of breakdown of the repair  The knots were buried in the soft tissues the retinaculum was repaired with a #1 running Vicryl wound was irrigated and closed with 0 Monocryl sutures and staples  Injected 30 mL of Marcaine with epinephrine into the joint and 30 mL in the subcutaneous tissue  A sterile bandage was applied with Ace bandages and knee immobilizer   Postoperative plan He is weightbearing as tolerated  He can have his staples out at postop day 14 or 15 and he can start range of motion 0-50 at 2 weeks and then at 6 weeks we can begin advancing that at 15 per week    PLAN OF CARE: Admit for overnight observation  PATIENT DISPOSITION:  PACU - hemodynamically stable.   Delay start of Pharmacological VTE agent (>24hrs) due to surgical blood loss or risk of bleeding: no

## 2016-09-23 NOTE — H&P (Signed)
Brian Roach is an 56 y.o. male.   Chief Complaint: LEFT KNEE PAIN  HPI: HE SLIPPED AND FELL AT HOME WALKING HIS DOG  HE FELT ACUTE SEVERE NON RADIATING LEFT  KNEE PAIN JUST ABOVE HIS PATELLA AFTER FALLING ON A FLEXED KNEE.   Past Medical History:  Diagnosis Date  . Diabetes mellitus without complication ([place])   . Hyperlipidemia   . Hypertension   . PE (pulmonary embolism)     Past Surgical History:  Procedure Laterality Date  . ANTERIOR CRUCIATE LIGAMENT REPAIR Right   . BACK SURGERY    . KNEE SURGERY    . LUNG SURGERY     Unspecified but was done for PE. Had chest tubes. Approx 2003    Family History  Problem Relation Age of Onset  . Heart disease Father    Social History:  reports that he has never smoked. He has never used smokeless tobacco. He reports that he does not drink alcohol or use drugs.  Allergies: No Known Allergies  Medications Prior to Admission  Medication Sig Dispense Refill  . aspirin EC 81 MG EC tablet Take 1 tablet (81 mg total) by mouth daily.    Marland Kitchen atorvastatin (LIPITOR) 20 MG tablet TAKE 1 TABLET EVERY EVENING 90 tablet 1  . benazepril (LOTENSIN) 20 MG tablet TAKE 1 TABLET (20 MG TOTAL) BY MOUTH DAILY, NEEDS TO BE SEEN 90 tablet 0  . diltiazem (CARDIZEM CD) 300 MG 24 hr capsule TAKE 1 CAPSULE (300 MG TOTAL) BY MOUTH DAILY. 90 capsule 1  . docusate sodium (COLACE) 100 MG capsule Take 200 mg by mouth 2 (two) times daily.    . metFORMIN (GLUCOPHAGE-XR) 500 MG 24 hr tablet TAKE 1 TABLET (500 MG TOTAL) BY MOUTH DAILY WITH BREAKFAST. 90 tablet 3  . Multiple Vitamins-Minerals (ONE-A-DAY MENS HEALTH FORMULA PO) Take 1 tablet by mouth daily.    Marland Kitchen terbinafine (LAMISIL) 250 MG tablet Take 1 tablet (250 mg total) by mouth daily. 30 tablet 2    Results for orders placed or performed during the hospital encounter of 09/22/16 (from the past 48 hour(s))  CBC with Differential/Platelet     Status: Abnormal   Collection Time: 09/22/16  2:00 PM  Result Value Ref  Range   WBC 16.1 (H) 4.0 - 10.5 K/uL   RBC 5.14 4.22 - 5.81 MIL/uL   Hemoglobin 15.7 13.0 - 17.0 g/dL   HCT 46.6 39.0 - 52.0 %   MCV 90.7 78.0 - 100.0 fL   MCH 30.5 26.0 - 34.0 pg   MCHC 33.7 30.0 - 36.0 g/dL   RDW 13.6 11.5 - 15.5 %   Platelets 249 150 - 400 K/uL   Neutrophils Relative % 73 %   Neutro Abs 11.8 (H) 1.7 - 7.7 K/uL   Lymphocytes Relative 17 %   Lymphs Abs 2.7 0.7 - 4.0 K/uL   Monocytes Relative 8 %   Monocytes Absolute 1.3 (H) 0.1 - 1.0 K/uL   Eosinophils Relative 2 %   Eosinophils Absolute 0.3 0.0 - 0.7 K/uL   Basophils Relative 0 %   Basophils Absolute 0.0 0.0 - 0.1 K/uL  Comprehensive metabolic panel     Status: Abnormal   Collection Time: 09/22/16  2:00 PM  Result Value Ref Range   Sodium 136 135 - 145 mmol/L   Potassium 4.7 3.5 - 5.1 mmol/L   Chloride 102 101 - 111 mmol/L   CO2 22 22 - 32 mmol/L   Glucose, Bld 103 (H) 65 -  99 mg/dL   BUN 13 6 - 20 mg/dL   Creatinine, Ser 0.78 0.61 - 1.24 mg/dL   Calcium 9.3 8.9 - 10.3 mg/dL   Total Protein 7.9 6.5 - 8.1 g/dL   Albumin 4.6 3.5 - 5.0 g/dL   AST 37 15 - 41 U/L   ALT 44 17 - 63 U/L   Alkaline Phosphatase 70 38 - 126 U/L   Total Bilirubin 1.0 0.3 - 1.2 mg/dL   GFR calc non Af Amer >60 >60 mL/min   GFR calc Af Amer >60 >60 mL/min    Comment: (NOTE) The eGFR has been calculated using the CKD EPI equation. This calculation has not been validated in all clinical situations. eGFR's persistently <60 mL/min signify possible Chronic Kidney Disease.    Anion gap 12 5 - 15  Glucose, capillary     Status: Abnormal   Collection Time: 09/22/16  4:57 PM  Result Value Ref Range   Glucose-Capillary 107 (H) 65 - 99 mg/dL   Comment 1 Notify RN    Comment 2 Document in Chart   Surgical pcr screen     Status: None   Collection Time: 09/22/16  8:15 PM  Result Value Ref Range   MRSA, PCR NEGATIVE NEGATIVE   Staphylococcus aureus NEGATIVE NEGATIVE    Comment:        The Xpert SA Assay (FDA approved for NASAL  specimens in patients over 66 years of age), is one component of a comprehensive surveillance program.  Test performance has been validated by [facility] for patients greater than or equal to 44 year old. It is not intended to diagnose infection nor to guide or monitor treatment.   Glucose, capillary     Status: Abnormal   Collection Time: 09/22/16  9:04 PM  Result Value Ref Range   Glucose-Capillary 114 (H) 65 - 99 mg/dL   Comment 1 Notify RN    Comment 2 Document in Chart    Dg Chest 2 View  Result Date: 09/22/2016 CLINICAL DATA:  Preoperative examination. History of diabetes, hypertension, previous pulmonary embolism. EXAM: CHEST  2 VIEW COMPARISON:  Chest x-ray of May 27, 2012 FINDINGS: The left lung is adequately inflated and clear. On the right the hemidiaphragm is mildly elevated. There is patchy density inferior laterally in the right hemithorax consistent with scarring. The heart and pulmonary vascularity are normal. The mediastinum is normal in width. There is no pleural effusion. The bony thorax is unremarkable. IMPRESSION: There is no acute cardiopulmonary abnormality. Electronically Signed   By: David  Martinique M.D.   On: 09/22/2016 14:23   Mr Knee Left Wo Contrast  Result Date: 09/22/2016 CLINICAL DATA:  Left knee pain after fall. EXAM: MRI OF THE LEFT KNEE WITHOUT CONTRAST TECHNIQUE: Multiplanar, multisequence MR imaging of the knee was performed. No intravenous contrast was administered. COMPARISON:  Left femur x-rays from same date. FINDINGS: MENISCI Medial meniscus:  Intact. Lateral meniscus:  Intact. LIGAMENTS Cruciates:  Intact ACL and PCL. Collaterals: Medial collateral ligament is intact. Lateral collateral ligament complex is intact. CARTILAGE Patellofemoral: Large area of full-thickness cartilage loss overlying the deep trochlear groove. Medial:  No chondral defect. Lateral:  No chondral defect. Joint:  Small joint effusion. Popliteal Fossa:  Small Baker cyst. Intact  popliteus tendon. Extensor Mechanism: The majority of the quadriceps tendon is attached to the avulsed patellar fragment. The vastus lateralis and vastus intermedius tendons remain attached to the patella. There is laxity of the patellar tendon. Bones: There is an  avulsion fracture of the superior patella with approximately 10 mm of retraction. Other: None. IMPRESSION: 1. Avulsion fracture of the superior patella with approximately 10 mm of retraction. The majority of the quadriceps tendon is attached to the avulsed fragment, however the vastus lateralis and vastus intermedius tendons remain attached to the patella. 2. Mild patellofemoral compartment degenerative changes. Electronically Signed   By: Titus Dubin M.D.   On: 09/22/2016 13:05   Dg Femur Min 2 Views Left  Result Date: 09/22/2016 CLINICAL DATA:  Post fall EXAM: LEFT FEMUR 2 VIEWS COMPARISON:  None. FINDINGS: A peripherally corticated ossicle measuring approximately 1.2 cm is seen superior to the patella and likely represents the sequela of age-indeterminate avulsive injury. No associated joint effusion or lipohemarthrosis. No additional fracture. Limited visualization of the hip is normal given obliquity and large field of view. Suspected mild tricompartmental degenerative change of the knee with joint space loss, articular surface irregularity and subchondral sclerosis, incompletely evaluated. Regional soft tissues appear normal. IMPRESSION: 1. Peripherally corticated ossicle adjacent to the superior pole of the patella likely represents the sequela of age-indeterminate avulsive injury. Correlation for point tenderness at this location is recommended. 2. Otherwise, no acute findings. Electronically Signed   By: Sandi Mariscal M.D.   On: 09/22/2016 09:18    Review of Systems  Respiratory: Negative for shortness of breath.   Cardiovascular: Negative for chest pain.  Gastrointestinal: Negative for constipation.  Genitourinary: Negative for  frequency.  Musculoskeletal: Positive for joint pain.  All other systems reviewed and are negative.   Blood pressure (!) 141/78, pulse 95, temperature 97.7 F (36.5 C), temperature source Oral, resp. rate 20, height '5\' 10"'$  (1.778 m), weight 256 lb 8 oz (116.3 kg), SpO2 98 %. Physical Exam  Constitutional: He is oriented to person, place, and time. He appears well-developed and well-nourished. No distress.  HENT:  Head: Normocephalic and atraumatic.  Right Ear: External ear normal.  Left Ear: External ear normal.  Eyes: Pupils are equal, round, and reactive to light. Conjunctivae and EOM are normal. Right eye exhibits no discharge. Left eye exhibits no discharge. No scleral icterus.  Neck: Normal range of motion. Neck supple. No JVD present. No tracheal deviation present. No thyromegaly present.  Cardiovascular: Normal rate, regular rhythm and intact distal pulses.   Respiratory: Effort normal and breath sounds normal. No stridor. No respiratory distress. He has no wheezes. He exhibits no tenderness.  GI: Soft. Bowel sounds are normal. He exhibits no distension and no mass. There is no tenderness.  Musculoskeletal:  LEFT KNEE PAIN TENDERNESS AT SUP POLE PATELLA  POOR SLR  STABLE VARUS VALGUS  PAINFUL PROM   HIS OTHER EXTREMITIES ARE NORMAL   Lymphadenopathy:    He has no cervical adenopathy.  Neurological: He is alert and oriented to person, place, and time. He has normal reflexes. He displays normal reflexes. No cranial nerve deficit. He exhibits normal muscle tone. Gait abnormal. Coordination normal.  Skin: Skin is warm and dry. No rash noted. He is not diaphoretic. No erythema. No pallor.  Psychiatric: He has a normal mood and affect. His behavior is normal. Judgment and thought content normal.     Assessment/Plan 56 YO MALE DIABETIC WITH HTN, + MRI SCAN FOR LEFT QUAD RUPTURE   REPAIR LEFT QUADRICEPS  This procedure has been fully reviewed with the patient and written informed  consent has been obtained.   Arther Abbott, MD 09/23/2016, 7:25 AM

## 2016-09-23 NOTE — Anesthesia Postprocedure Evaluation (Signed)
Anesthesia Post Note  Patient: Brian Roach  Procedure(s) Performed: Procedure(s) (LRB): REPAIR QUADRICEP TENDON (Left)  Patient location during evaluation: PACU Anesthesia Type: General Level of consciousness: awake and alert and patient cooperative Pain management: pain level controlled Vital Signs Assessment: post-procedure vital signs reviewed and stable Respiratory status: spontaneous breathing, nonlabored ventilation, respiratory function stable and patient connected to nasal cannula oxygen Cardiovascular status: blood pressure returned to baseline Postop Assessment: no signs of nausea or vomiting Anesthetic complications: no     Last Vitals:  Vitals:   09/23/16 1518 09/23/16 1530  BP: (!) 151/88 138/86  Pulse: (!) 103 94  Resp: 17 18  Temp: 36.8 C     Last Pain:  Vitals:   09/23/16 1530  TempSrc:   PainSc: 5                  Lavergne Hiltunen J

## 2016-09-23 NOTE — Interval H&P Note (Signed)
History and Physical Interval Note:  09/23/2016 12:49 PM  Brian Roach  has presented today for surgery, with the diagnosis of quadriceps tendon rupture left knee  The various methods of treatment have been discussed with the patient and family. After consideration of risks, benefits and other options for treatment, the patient has consented to  Procedure(s): REPAIR QUADRICEP TENDON (Left) as a surgical intervention .  The patient's history has been reviewed, patient examined, no change in status, stable for surgery.  I have reviewed the patient's chart and labs.  Questions were answered to the patient's satisfaction.     Fuller CanadaStanley Aaira Oestreicher

## 2016-09-24 ENCOUNTER — Observation Stay (HOSPITAL_COMMUNITY): Payer: BC Managed Care – PPO

## 2016-09-24 ENCOUNTER — Encounter (HOSPITAL_COMMUNITY): Payer: Self-pay

## 2016-09-24 LAB — PROTIME-INR
INR: 1.11
Prothrombin Time: 14.3 seconds (ref 11.4–15.2)

## 2016-09-24 LAB — GLUCOSE, CAPILLARY
GLUCOSE-CAPILLARY: 115 mg/dL — AB (ref 65–99)
GLUCOSE-CAPILLARY: 152 mg/dL — AB (ref 65–99)
GLUCOSE-CAPILLARY: 131 mg/dL — AB (ref 65–99)
Glucose-Capillary: 114 mg/dL — ABNORMAL HIGH (ref 65–99)

## 2016-09-24 MED ORDER — ALBUTEROL SULFATE (2.5 MG/3ML) 0.083% IN NEBU
2.5000 mg | INHALATION_SOLUTION | Freq: Once | RESPIRATORY_TRACT | Status: AC
  Administered 2016-09-24: 2.5 mg via RESPIRATORY_TRACT

## 2016-09-24 MED ORDER — ENOXAPARIN SODIUM 120 MG/0.8ML ~~LOC~~ SOLN
120.0000 mg | Freq: Two times a day (BID) | SUBCUTANEOUS | Status: DC
  Filled 2016-09-24 (×5): qty 0.8

## 2016-09-24 MED ORDER — WARFARIN SODIUM 2.5 MG PO TABS: 2.5000 mg | ORAL_TABLET | Freq: Every day | ORAL | 11 refills | Status: DC

## 2016-09-24 MED ORDER — ALBUTEROL SULFATE (2.5 MG/3ML) 0.083% IN NEBU
INHALATION_SOLUTION | RESPIRATORY_TRACT | Status: AC
  Administered 2016-09-24: 2.5 mg via RESPIRATORY_TRACT
  Filled 2016-09-24: qty 3

## 2016-09-24 MED ORDER — WARFARIN - PHARMACIST DOSING INPATIENT: Freq: Every day | Status: DC

## 2016-09-24 MED ORDER — PROMETHAZINE HCL 12.5 MG PO TABS: 12.5000 mg | ORAL_TABLET | Freq: Four times a day (QID) | ORAL | 0 refills | Status: DC | PRN

## 2016-09-24 MED ORDER — ENOXAPARIN SODIUM 120 MG/0.8ML ~~LOC~~ SOLN
1.0000 mg/kg | Freq: Once | SUBCUTANEOUS | Status: AC
  Administered 2016-09-24: 115 mg via SUBCUTANEOUS
  Filled 2016-09-24: qty 0.8

## 2016-09-24 MED ORDER — WARFARIN SODIUM 5 MG PO TABS: 10.0000 mg | ORAL_TABLET | Freq: Once | ORAL | Status: DC

## 2016-09-24 MED ORDER — OXYCODONE-ACETAMINOPHEN 5-325 MG PO TABS: 1.0000 | ORAL_TABLET | ORAL | 0 refills | Status: DC | PRN

## 2016-09-24 MED ORDER — IOPAMIDOL (ISOVUE-370) INJECTION 76%
100.0000 mL | Freq: Once | INTRAVENOUS | Status: AC | PRN
  Administered 2016-09-24: 100 mL via INTRAVENOUS

## 2016-09-24 MED ORDER — HEPARIN (PORCINE) IN NACL 100-0.45 UNIT/ML-% IJ SOLN
2100.0000 [IU]/h | INTRAMUSCULAR | Status: AC
  Administered 2016-09-25 (×2): 1700 [IU]/h via INTRAVENOUS
  Administered 2016-09-26: 1900 [IU]/h via INTRAVENOUS
  Administered 2016-09-26 – 2016-09-29 (×5): 2100 [IU]/h via INTRAVENOUS
  Filled 2016-09-24 (×8): qty 250

## 2016-09-24 MED ORDER — ENOXAPARIN SODIUM 120 MG/0.8ML ~~LOC~~ SOLN
1.0000 mg/kg | Freq: Two times a day (BID) | SUBCUTANEOUS | Status: DC
  Filled 2016-09-24 (×5): qty 0.8

## 2016-09-24 NOTE — Progress Notes (Signed)
Transferred patient to ICU stepdown with personal belongings.

## 2016-09-24 NOTE — Care Management Note (Signed)
Case Management Note  Patient Details  Name: Brian BuffWilliam F Vollman MRN: [id number] Date of Birth: 05/04/1960  Subjective/Objective:                  Pt admited s/p repair of knee injury. Pt is from home, lives with family and is ind at baseline. PT has recommended RW and BSC. Pt has RW pta. Has no preference of DME provider. Pt does not want HH and would prefer any PT be completed in the OP setting. CM will leave note to surgeon to order OP PT when pt is ready. Mother at bedside for DC plan discussion. Pt communicates no further needs.   Action/Plan: Nida BoatmanBrad, [facility], aware of DME referral and will obtain pt info from chart and will deliver DME to pt room prior to DC.   Expected Discharge Date:  09/24/16               Expected Discharge Plan:  Home/Self Care  In-House Referral:  NA  Discharge planning Services  CM Consult  Post Acute Care Choice:  Durable Medical Equipment Choice offered to:  Patient  DME Arranged:  3-N-1 DME Agency:  [facility]  Status of Service:  Completed, signed off  Malcolm MetroChildress, Neysa Arts Demske, RN 09/24/2016, 1:05 PM

## 2016-09-24 NOTE — Consult Note (Signed)
Consult requested by: Dr. Romeo Apple Consult requested for: Pulmonary emboli  HPI: This is a 56 year old who suffered an injury to his leg and underwent operative repair yesterday. Today when he was getting up he became short of breath somewhat diaphoretic and he had a previous history of pulmonary embolism so a CT scan of the chest was ordered which shows bilateral large pulmonary emboli. His RV LV ratio was borderline for right ventricular strain. He says he feels better. He complains of pain in his leg. He has previous history of pulmonary embolism after leg surgery and he was on prophylactic heparin. He has previous history of hypertension diabetes and hyperlipidemia. He does not know of any history of any sort of clotting abnormalities.  Past Medical History:  Diagnosis Date  . Diabetes mellitus without complication ([facility])   . Hyperlipidemia   . Hypertension   . PE (pulmonary embolism)      Family History  Problem Relation Age of Onset  . Heart disease Father      Social History   Social History  . Marital status: Divorced    Spouse name: N/A  . Number of children: N/A  . Years of education: N/A   Social History Main Topics  . Smoking status: Never Smoker  . Smokeless tobacco: Never Used  . Alcohol use No  . Drug use: No  . Sexual activity: Yes   Other Topics Concern  . None   Social History Narrative  . None     ROS: Except as mentioned 10 point review of systems is negative    Objective: Vital signs in last 24 hours: Temp:  [97.6 F (36.4 C)-98.9 F (37.2 C)] 98.9 F (37.2 C) (07/27 1510) Pulse Rate:  [83-135] 108 (07/27 1522) Resp:  [18-20] 20 (07/27 1510) BP: (132-155)/(82-98) 139/82 (07/27 1510) SpO2:  [79 %-100 %] 97 % (07/27 1522) FiO2 (%):  [3 %] 3 % (07/27 1522) Weight change: -3.629 kg (-8 lb) Last BM Date: 09/21/16  Intake/Output from previous day: 07/26 0701 - 07/27 0700 In: 1100 [I.V.:1100] Out: 950 [Urine:900; Blood:50]  PHYSICAL  EXAM Constitutional he is awake and alert and does not appear to be in any acute distress. Cardiovascular: His heart is regular with normal heart sounds. Respiratory: His respiratory effort is normal and his lungs are clear. Gastrointestinal: His abdomen is soft with no masses. Musculoskeletal: His left leg is wrapped but he doesn't have any tenderness. He doesn't have any edema no tenderness or cord in his right leg. Skin is warm and dry. Neurological is grossly intact. Psychiatric normal mood and affect  Lab Results: Basic Metabolic Panel:  Recent Labs  16/10/96 1400 09/23/16 1717  NA 136  --   K 4.7  --   CL 102  --   CO2 22  --   GLUCOSE 103*  --   BUN 13  --   CREATININE 0.78 0.78  CALCIUM 9.3  --    Liver Function Tests:  Recent Labs  09/22/16 1400  AST 37  ALT 44  ALKPHOS 70  BILITOT 1.0  PROT 7.9  ALBUMIN 4.6   No results for input(s): LIPASE, AMYLASE in the last 72 hours. No results for input(s): AMMONIA in the last 72 hours. CBC:  Recent Labs  09/22/16 1400 09/23/16 1717  WBC 16.1* 15.2*  NEUTROABS 11.8*  --   HGB 15.7 14.6  HCT 46.6 43.2  MCV 90.7 91.1  PLT 249 209   Cardiac Enzymes: No results for input(s): CKTOTAL,  CKMB, CKMBINDEX, TROPONINI in the last 72 hours. BNP: No results for input(s): PROBNP in the last 72 hours. D-Dimer: No results for input(s): DDIMER in the last 72 hours. CBG:  Recent Labs  09/23/16 1324 09/23/16 1521 09/23/16 2021 09/24/16 0808 09/24/16 1110 09/24/16 1604  GLUCAP 99 119* 147* 115* 152* 114*   Hemoglobin A1C: No results for input(s): HGBA1C in the last 72 hours. Fasting Lipid Panel: No results for input(s): CHOL, HDL, LDLCALC, TRIG, CHOLHDL, LDLDIRECT in the last 72 hours. Thyroid Function Tests: No results for input(s): TSH, T4TOTAL, FREET4, T3FREE, THYROIDAB in the last 72 hours. Anemia Panel: No results for input(s): VITAMINB12, FOLATE, FERRITIN, TIBC, IRON, RETICCTPCT in the last 72  hours. Coagulation: No results for input(s): LABPROT, INR in the last 72 hours. Urine Drug Screen: Drugs of Abuse  No results found for: LABOPIA, COCAINSCRNUR, LABBENZ, AMPHETMU, THCU, LABBARB  Alcohol Level: No results for input(s): ETH in the last 72 hours. Urinalysis: No results for input(s): COLORURINE, LABSPEC, PHURINE, GLUCOSEU, HGBUR, BILIRUBINUR, KETONESUR, PROTEINUR, UROBILINOGEN, NITRITE, LEUKOCYTESUR in the last 72 hours.  Invalid input(s): APPERANCEUR Misc. Labs:   ABGS: No results for input(s): PHART, PO2ART, TCO2, HCO3 in the last 72 hours.  Invalid input(s): PCO2   MICROBIOLOGY: Recent Results (from the past 240 hour(s))  Surgical pcr screen     Status: None   Collection Time: 09/22/16  8:15 PM  Result Value Ref Range Status   MRSA, PCR NEGATIVE NEGATIVE Final   Staphylococcus aureus NEGATIVE NEGATIVE Final    Comment:        The Xpert SA Assay (FDA approved for NASAL specimens in patients over 58 years of age), is one component of a comprehensive surveillance program.  Test performance has been validated [facility] for patients greater than or equal to 58 year old. It is not intended to diagnose infection nor to guide or monitor treatment.     Studies/Results: Dg Chest 1 View  Result Date: 09/24/2016 CLINICAL DATA:  Shortness of breath on exertion. EXAM: CHEST 1 VIEW COMPARISON:  09/22/2016 FINDINGS: Stable elevation of the right hemidiaphragm. Lungs are clear. Heart and mediastinum are within normal limits. The trachea is midline. No acute bone abnormality. IMPRESSION: No acute chest findings. Electronically Signed   By: Richarda OverlieAdam  Henn M.D.   On: 09/24/2016 15:09   Ct Angio Chest Pe W Or Wo Contrast  Result Date: 09/24/2016 CLINICAL DATA:  Shortness of breath, history of pulmonary embolism, underwent knee surgery on 09/24/2016 EXAM: CT ANGIOGRAPHY CHEST WITH CONTRAST TECHNIQUE: Multidetector CT imaging of the chest was performed using the standard  protocol during bolus administration of intravenous contrast. Multiplanar CT image reconstructions and MIPs were obtained to evaluate the vascular anatomy. CONTRAST:  200 mL Isovue-300  IV COMPARISON:  None. FINDINGS: Cardiovascular: Aorta normal caliber without aneurysm or dissection. Imaging following initial contrast bolus was suboptimal so a second contrast bolus and imaging were performed. Large filling defects identified within the pulmonary arteries consistent with pulmonary emboli. This includes a large pulmonary embolus in the LEFT main pulmonary artery extending into LEFT upper lobe, a large embolus in the LEFT lower lobe, and centrally in the RIGHT upper lobe pulmonary artery. RV/LV ratio of 0.88. No pericardial effusion. Mediastinum/Nodes: No thoracic adenopathy. Esophagus unremarkable. Base of cervical region normal appearance. Lungs/Pleura: Minimal dependent atelectasis in the lungs. No infiltrate, pleural effusion or pneumothorax. Upper Abdomen: Normal appearance Musculoskeletal: Mild degenerative disc disease changes thoracic spine. No acute osseous findings. Review of the MIP images  confirms the above findings. IMPRESSION: Large BILATERAL pulmonary emboli as above with an RV/LV ratio = 0.88. Findings called to Dr. Romeo AppleHarrison on 09/24/2016 at 1515 hours. Electronically Signed   By: Ulyses SouthwardMark  Boles M.D.   On: 09/24/2016 15:19    Medications:  Prior to Admission:  Prescriptions Prior to Admission  Medication Sig Dispense Refill Last Dose  . aspirin EC 81 MG EC tablet Take 1 tablet (81 mg total) by mouth daily.   09/22/2016 at Unknown time  . atorvastatin (LIPITOR) 20 MG tablet TAKE 1 TABLET EVERY EVENING 90 tablet 1 09/21/2016 at Unknown time  . benazepril (LOTENSIN) 20 MG tablet TAKE 1 TABLET (20 MG TOTAL) BY MOUTH DAILY, NEEDS TO BE SEEN 90 tablet 0 09/21/2016 at Unknown time  . diltiazem (CARDIZEM CD) 300 MG 24 hr capsule TAKE 1 CAPSULE (300 MG TOTAL) BY MOUTH DAILY. 90 capsule 1 09/22/2016 at  Unknown time  . docusate sodium (COLACE) 100 MG capsule Take 200 mg by mouth 2 (two) times daily.   09/21/2016 at Unknown time  . metFORMIN (GLUCOPHAGE-XR) 500 MG 24 hr tablet TAKE 1 TABLET (500 MG TOTAL) BY MOUTH DAILY WITH BREAKFAST. 90 tablet 3 09/21/2016 at Unknown time  . Multiple Vitamins-Minerals (ONE-A-DAY MENS HEALTH FORMULA PO) Take 1 tablet by mouth daily.   09/21/2016 at Unknown time  . terbinafine (LAMISIL) 250 MG tablet Take 1 tablet (250 mg total) by mouth daily. 30 tablet 2 Past Week at Unknown time   Scheduled: . atorvastatin  20 mg Oral QPM  . benazepril  20 mg Oral Daily  . diltiazem  300 mg Oral Daily  . docusate sodium  200 mg Oral BID  . metFORMIN  500 mg Oral Q breakfast  . multivitamin with minerals   Oral Daily  . terbinafine  250 mg Oral Daily   Continuous: . [START ON 09/25/2016] heparin     RUE:[id number]:acetaminophen **OR** acetaminophen, HYDROcodone-acetaminophen, HYDROmorphone (DILAUDID) injection, oxyCODONE-acetaminophen, promethazine  Assesment: He had quadriceps muscle rupture and underwent surgery for that. This has been complicated by pulmonary emboli. He has previous history of pulmonary emboli after surgery but he's not aware of any sort of clotting abnormality personally or in his family. He has been started on appropriate treatment. His emboli are at least submassive. Active Problems:   Knee injury   Quadriceps muscle rupture, left, initial encounter    Plan: Transfer to stepdown. Start heparin when appropriate. Bed rest today and tomorrow. Check echocardiogram to check right ventricular function.    LOS: 1 day   Sabiha Sura L 09/24/2016, 4:46 PM

## 2016-09-24 NOTE — Anesthesia Postprocedure Evaluation (Signed)
Anesthesia Post Note  Patient: Brian Roach  Procedure(s) Performed: Procedure(s) (LRB): REPAIR QUADRICEP TENDON (Left)  Patient location during evaluation: Nursing Unit Anesthesia Type: General Level of consciousness: awake and alert Pain management: satisfactory to patient (post medicine) Vital Signs Assessment: post-procedure vital signs reviewed and stable Respiratory status: spontaneous breathing Cardiovascular status: stable Postop Assessment: no signs of nausea or vomiting Anesthetic complications: no     Last Vitals:  Vitals:   09/23/16 1800 09/24/16 0300  BP: 132/82 (!) 155/98  Pulse: 83 (!) 105  Resp: 18 20  Temp: 36.4 C     Last Pain:  Vitals:   09/24/16 0720  TempSrc:   PainSc: 8                  Bishop Vanderwerf

## 2016-09-24 NOTE — Progress Notes (Signed)
SATURATION QUALIFICATIONS: (This note is used to comply with regulatory documentation for home oxygen)  Patient Saturations on Room Air at Rest = 88%  Patient Saturations on Room Air while Ambulating = 79%  Patient Saturations on 3 Liters of oxygen while Ambulating = 90%

## 2016-09-24 NOTE — Addendum Note (Signed)
Addendum  created 09/24/16 0857 by Franco NonesYates, Dyamond Tolosa S, CRNA   Sign clinical note

## 2016-09-24 NOTE — Progress Notes (Signed)
PT Cancellation Note  Patient Details Name: Brian Roach MRN: [id number] DOB: 06/08/1960   Cancelled Treatment:    Reason Eval/Treat Not Completed: Medical issues which prohibited therapy. RN consulted, reporting recent imaging revealing of large bilat PE. Per [facility] policy, PT is contraindicated in patients with acute PE until after 2D of therapeutic dosing of anticoagulation. PT treatment is held at this time. Continuation of PT treatment will defer to the discretion of the attending and/or orthopedic and will require a new order, continuation, or verbal.   3:40 PM, 09/24/16 Rosamaria LintsAllan C Luian Schumpert, PT, DPT Physical Therapist - [place] [phone](ASCOM)  [phone] (Office)       [name] C 09/24/2016, 3:36 PM

## 2016-09-24 NOTE — Progress Notes (Signed)
Pt has been seen by Dr. Juanetta GoslingHawkins regarding PE, recommending transfer to step-down and bed rest today and tomorrow. I will cancel the current PT order at this time. Pt will require a new PT order once medically appropriate.   5:42 PM, 09/24/16 Rosamaria LintsAllan C Kevyn Boquet, PT, DPT Physical Therapist - [place] [phone](ASCOM)  [phone] (Office)

## 2016-09-24 NOTE — Progress Notes (Signed)
ANTICOAGULATION CONSULT NOTE - Initial Consult  Pharmacy Consult for Heparin Indication: pulmonary embolus  No Known Allergies  Patient Measurements: Height: 5\' 10"  (177.8 cm) Weight: 256 lb (116.1 kg) IBW/kg (Calculated) : 73 HEPARIN DW (KG): 98.7  Vital Signs: Temp: 98.9 F (37.2 C) (07/27 1510) Temp Source: Oral (07/27 1510) BP: 139/82 (07/27 1510) Pulse Rate: 108 (07/27 1522)  Labs:  Recent Labs  09/22/16 1400 09/23/16 1717  HGB 15.7 14.6  HCT 46.6 43.2  PLT 249 209  CREATININE 0.78 0.78    Estimated Creatinine Clearance: 128.4 mL/min (by C-G formula based on SCr of 0.78 mg/dL).   Medical History: Past Medical History:  Diagnosis Date  . Diabetes mellitus without complication (HCC)   . Hyperlipidemia   . Hypertension   . PE (pulmonary embolism)     Medications:  Prescriptions Prior to Admission  Medication Sig Dispense Refill Last Dose  . aspirin EC 81 MG EC tablet Take 1 tablet (81 mg total) by mouth daily.   09/22/2016 at Unknown time  . atorvastatin (LIPITOR) 20 MG tablet TAKE 1 TABLET EVERY EVENING 90 tablet 1 09/21/2016 at Unknown time  . benazepril (LOTENSIN) 20 MG tablet TAKE 1 TABLET (20 MG TOTAL) BY MOUTH DAILY, NEEDS TO BE SEEN 90 tablet 0 09/21/2016 at Unknown time  . diltiazem (CARDIZEM CD) 300 MG 24 hr capsule TAKE 1 CAPSULE (300 MG TOTAL) BY MOUTH DAILY. 90 capsule 1 09/22/2016 at Unknown time  . docusate sodium (COLACE) 100 MG capsule Take 200 mg by mouth 2 (two) times daily.   09/21/2016 at Unknown time  . metFORMIN (GLUCOPHAGE-XR) 500 MG 24 hr tablet TAKE 1 TABLET (500 MG TOTAL) BY MOUTH DAILY WITH BREAKFAST. 90 tablet 3 09/21/2016 at Unknown time  . Multiple Vitamins-Minerals (ONE-A-DAY MENS HEALTH FORMULA PO) Take 1 tablet by mouth daily.   09/21/2016 at Unknown time  . terbinafine (LAMISIL) 250 MG tablet Take 1 tablet (250 mg total) by mouth daily. 30 tablet 2 Past Week at Unknown time    Assessment: 56 yo male who had a tendon rupture of the  left knee that was repaired 7/26. Patient started desaturation with ambulation, and diaphoresis with Physical Therapy. Patient has a history of clot with a previous surgery many years ago. CT angiogram shows bilateral pulmonary emboli. Pharmacy asked to dose heparin for patient. One dose of lovenox given at 1600 today;  Therefore,  will delay initiation of heparin to 0400 7/28.   Goal of Therapy:  Heparin level 0.3-0.7 units/ml Monitor platelets by anticoagulation protocol: Yes   Plan:  Start heparin infusion at 1700 units/hr Check anti-Xa level in 6-8 hours hours and daily while on heparin Continue to monitor H&H and platelets  Elder CyphersLorie Earlean Fidalgo, BS [employer] BackPharm D, BCPS Clinical Pharmacist Pager [phone] 09/24/2016,4:35 PM

## 2016-09-24 NOTE — Progress Notes (Signed)
Patient ID: Brian Roach, male   DOB: 08/25/1960, 56 y.o.   MRN: [id number] patient scheduled for DC after left quadriceps repair, he got up with PT became SOB, with low 02 sat  After nasal 02 sat improved to 92 %  CT angio shows PE //Pulm Art ext to both lungs   I ve started lovenox and talked with pharmacy   I ve called Cardiothoracic and I m tried to reach [employer] Hawkins   Current vs  BP 139/82 (BP Location: Right Arm)   Pulse (!) 108   Temp 98.9 F (37.2 C) (Oral)   Resp 20   Ht 5\' 10"  (1.778 m)   Wt 256 lb (116.1 kg)   SpO2 97%   BMI 36.73 kg/m   EKG was done and official reading is pending   Supportive care for now

## 2016-09-24 NOTE — Progress Notes (Signed)
Have attempted to call Dr. Juanetta GoslingHawkins 2 times and have paged him twice about STAT pulmonology consult for Dr. Romeo AppleHarrison. Will continue to page.

## 2016-09-24 NOTE — Progress Notes (Signed)
Received verbal order for nebulizer albuterol, an EKG 12-lead, and a chest x-ray from Dr. Mort SawyersS. Harrison.

## 2016-09-24 NOTE — Evaluation (Signed)
Physical Therapy Evaluation Patient Details Name: Brian Roach MRN: [id number] DOB: 10-27-1960 Today's Date: 09/24/2016   History of Present Illness  Seon Kalmus is a 56yo white male who was walking his dog in flip flops and fell onto a flexed knee, ultimately arriving at the ED [facility] on 7/25. Ortho consult reports full rupture of the quadriceps tendon from the patella and some adjacent soft tissue injury. Pt presents on 7/27, POD1 s/p patellar tendon repair. PMH: DM, PE, HTN, HLD, Rt ACL surgery (~15ya c subsequent PE), back surgery.   Clinical Impression  Pt admitted with above diagnosis. Pt currently with functional limitations due to the deficits listed below (see "PT Problem List"). Upon entry, the patient is received semirecumbent in bed, no family/caregiver present. Pt c/o a sleepless night, and warmth in room [facility] appears to be not functioning properly), and stinging pain in Left knee. The pt is alert and oriented x3, pleasant, conversational, and following simple and multi-step commands consistently. Pt received on room air, low sats that worsen with positional changes and activity, eventually requiring and remaining on 3LPM O2 throughout at end of session, with noted saturation of 90%, whereas pt is not on O2 at baseline at home. Pt is also tachycardic, 115bpm at rest supine, which elevates to 140s after stand pivot transfer to chair. RN made aware. Functional mobility assessment demonstrates moderate strength deficits, the pt now requiring Min-Mod-assist physical assistance for transfers, whereas the patient performs these independently at baseline. Activity tolerance is also heavily impaired as seen in vitals response to sitting and transferring which correlate with nausea and giddiness. The patient is at high risk for falls as evidence by gait speed <1.28m/s, forward reach <5", and multiple LOB demonstrated throughout session. Pt is unable to tolerate transfers at this time and cannot safely  manage amb distances required for safe access of home: he will need STR stay to address the aforementioned. Pt will benefit from skilled PT intervention to increase independence and safety with basic mobility in preparation for discharge to the venue listed below.    *please see prior PT note for additional detail regarding vitals response.      Follow Up Recommendations SNF;Supervision for mobility/OOB    Equipment Recommendations  Rolling walker with 5" wheels;3in1 (PT)    Recommendations for Other Services       Precautions / Restrictions Precautions Precautions: Fall Required Braces or Orthoses: Knee Immobilizer - Left (No flexion for 2 weeks. ) Knee Immobilizer - Left: On at all times Restrictions Weight Bearing Restrictions: Yes LLE Weight Bearing: Weight bearing as tolerated      Mobility  Bed Mobility Overal bed mobility: Needs Assistance Bed Mobility: Supine to Sit     Supine to sit: Supervision     General bed mobility comments: VC for technique and safety  Transfers Overall transfer level: Needs assistance Equipment used: Rolling walker (2 wheeled) Transfers: Sit to/from Stand Sit to Stand: Min assist;From elevated surface         General transfer comment: unsteady, difficulty transisitoning to weightbearing on LLE   Ambulation/Gait Ambulation/Gait assistance:  (bed to chair only, vitals not stable.)              Stairs            Wheelchair Mobility    Modified Rankin (Stroke Patients Only)       Balance Overall balance assessment: Needs assistance         Standing balance support: Bilateral upper extremity supported;During  functional activity Standing balance-Leahy Scale: Poor                               Pertinent Vitals/Pain Pain Assessment: 0-10 Pain Score: 4  Pain Location: Left knee/quads; also c/o Lt lateral knee burning and stinging  Pain Descriptors / Indicators: Operative site  guarding;Burning;Aching;Sore Pain Intervention(s): Limited activity within patient's tolerance;Monitored during session;Premedicated before session;Repositioned;RN gave pain meds during session    Home Living Family/patient expects to be discharged to:: Private residence Living Arrangements: Spouse/significant other;Other relatives Available Help at Discharge: Family Type of Home: House Home Access: Stairs to enter   Entrance Stairs-Number of Steps: 1 Home Layout: Two level;Able to live on main level with bedroom/bathroom Home Equipment: Dan Humphreys - 2 wheels      Prior Function Level of Independence: Independent               Hand Dominance   Dominant Hand: Right    Extremity/Trunk Assessment   Upper Extremity Assessment Upper Extremity Assessment: Generalized weakness    Lower Extremity Assessment Lower Extremity Assessment: Generalized weakness       Communication   Communication: No difficulties  Cognition Arousal/Alertness: Lethargic (drowsy ) Behavior During Therapy: WFL for tasks assessed/performed Overall Cognitive Status: Within Functional Limits for tasks assessed                                        General Comments      Exercises     Assessment/Plan    PT Assessment Patient needs continued PT services  PT Problem List Decreased balance;Decreased activity tolerance;Decreased mobility;Obesity       PT Treatment Interventions DME instruction;Gait training;Stair training;Functional mobility training;Therapeutic activities;Therapeutic exercise;Balance training;Patient/family education    PT Goals (Current goals can be found in the Care Plan section)  Acute Rehab PT Goals Patient Stated Goal: return to home PT Goal Formulation: With patient Time For Goal Achievement: 10/08/16 Potential to Achieve Goals: Good    Frequency BID   Barriers to discharge        Co-evaluation               AM-PAC PT "6 Clicks" Daily  Activity  Outcome Measure Difficulty turning over in bed (including adjusting bedclothes, sheets and blankets)?: A Lot Difficulty moving from lying on back to sitting on the side of the bed? : A Lot Difficulty sitting down on and standing up from a chair with arms (e.g., wheelchair, bedside commode, etc,.)?: Total Help needed moving to and from a bed to chair (including a wheelchair)?: Total Help needed walking in hospital room?: A Lot Help needed climbing 3-5 steps with a railing? : Total 6 Click Score: 9    End of Session Equipment Utilized During Treatment: Gait belt;Oxygen;Left knee immobilizer Activity Tolerance: Treatment limited secondary to medical complications (Comment) (tacycardia, hypoxia, nausea, diaphoresis) Patient left: in chair;with call bell/phone within reach Nurse Communication: Mobility status;Other (comment) (vitals response ) PT Visit Diagnosis: Muscle weakness (generalized) (M62.81);Difficulty in walking, not elsewhere classified (R26.2)    Time: 1914-7829 PT Time Calculation (min) (ACUTE ONLY): 32 min   Charges:   PT Evaluation $PT Eval Moderate Complexity: 1 Procedure     PT G Codes:   PT G-Codes **NOT FOR INPATIENT CLASS** Functional Assessment Tool Used: Clinical judgement;AM-PAC 6 Clicks Basic Mobility Functional Limitation: Mobility: Walking and moving  around Mobility: Walking and Moving Around Current Status [phone]): At least 60 percent but less than 80 percent impaired, limited or restricted Mobility: Walking and Moving Around Goal Status [phone]): At least 20 percent but less than 40 percent impaired, limited or restricted    12:35 PM, 09/24/16 Rosamaria Lints, PT, DPT Physical Therapist - [place] [phone] (Office)  Kalayah Leske C 09/24/2016, 12:29 PM

## 2016-09-24 NOTE — Progress Notes (Addendum)
ANTICOAGULATION CONSULT NOTE - Initial Consult  Pharmacy Consult for Lovenox and Coumadin Indication: pulmonary embolus  No Known Allergies  Patient Measurements: Height: 5\' 10"  (177.8 cm) Weight: 256 lb (116.1 kg) IBW/kg (Calculated) : 73  Vital Signs: Temp: 98.9 F (37.2 C) (07/27 1510) Temp Source: Oral (07/27 1510) BP: 139/82 (07/27 1510) Pulse Rate: 108 (07/27 1522)  Labs:  Recent Labs  09/22/16 1400 09/23/16 1717  HGB 15.7 14.6  HCT 46.6 43.2  PLT 249 209  CREATININE 0.78 0.78    Estimated Creatinine Clearance: 128.4 mL/min (by C-G formula based on SCr of 0.78 mg/dL).   Medical History: Past Medical History:  Diagnosis Date  . Diabetes mellitus without complication (HCC)   . Hyperlipidemia   . Hypertension   . PE (pulmonary embolism)     Medications:  Prescriptions Prior to Admission  Medication Sig Dispense Refill Last Dose  . aspirin EC 81 MG EC tablet Take 1 tablet (81 mg total) by mouth daily.   09/22/2016 at Unknown time  . atorvastatin (LIPITOR) 20 MG tablet TAKE 1 TABLET EVERY EVENING 90 tablet 1 09/21/2016 at Unknown time  . benazepril (LOTENSIN) 20 MG tablet TAKE 1 TABLET (20 MG TOTAL) BY MOUTH DAILY, NEEDS TO BE SEEN 90 tablet 0 09/21/2016 at Unknown time  . diltiazem (CARDIZEM CD) 300 MG 24 hr capsule TAKE 1 CAPSULE (300 MG TOTAL) BY MOUTH DAILY. 90 capsule 1 09/22/2016 at Unknown time  . docusate sodium (COLACE) 100 MG capsule Take 200 mg by mouth 2 (two) times daily.   09/21/2016 at Unknown time  . metFORMIN (GLUCOPHAGE-XR) 500 MG 24 hr tablet TAKE 1 TABLET (500 MG TOTAL) BY MOUTH DAILY WITH BREAKFAST. 90 tablet 3 09/21/2016 at Unknown time  . Multiple Vitamins-Minerals (ONE-A-DAY MENS HEALTH FORMULA PO) Take 1 tablet by mouth daily.   09/21/2016 at Unknown time  . terbinafine (LAMISIL) 250 MG tablet Take 1 tablet (250 mg total) by mouth daily. 30 tablet 2 Past Week at Unknown time    Assessment: 56 yo male who had a tendon rupture of the left knee  that was repaired 7/26. Patient started desaturation with ambulation, and diaphoresis with Physical Therapy. Patient has a history of clot with a previous surgery many years ago. CT angiogram shows bilateral pulmonary emboli. Pharmacy asked to dose lovenox and coumadin for patient.    Goal of Therapy:  INR 2-3 Monitor platelets by anticoagulation protocol: Yes   Plan:  Lovenox 1mg /kg (120mg ) sq Q12h Coumadin 10mg  x 1 today PT-INR daily Monitor for S/S of bleeding Education for coumadin and lovenox  Elder CyphersLorie Artha Chiasson, BS [employer] BackPh[employer] D, BCPS Clinical Pharmacist Pager [phone] 09/24/2016,3:44 PM

## 2016-09-24 NOTE — Treatment Plan (Signed)
At PT evaluation this morning 10:26AM, vitals not well controlled in session, noting the following:   Supine on room air: SpO2: 88%, Hr: 115 bpm Seated EOB on room air: SpO2: 79%, Hr: 135 bpm Seated EOB on 2LPM @ 60seconds: SpO2: 90%, Hr: 118bpm S/p stand pivot transfer to chair on 2LPM: SpO2: 83%, Hr: 140bpm 3 minutes seated recovery in chair on 3LPM: SpO2: 90%, Hr: 120bpm  RN notified. Pt is diaphoretic, denies SOB, denies CP, endorses dizziness which correlates with desaturation. Mild/moderate stable nausea without emesis.    Full PT evaluation note pending.   11:05 AM, 09/24/16 Rosamaria LintsAllan C Inioluwa Boulay, PT, DPT Physical Therapist - [place] [phone](ASCOM)  [phone] (Office)

## 2016-09-25 LAB — GLUCOSE, CAPILLARY
GLUCOSE-CAPILLARY: 133 mg/dL — AB (ref 65–99)
GLUCOSE-CAPILLARY: 122 mg/dL — AB (ref 65–99)
Glucose-Capillary: 132 mg/dL — ABNORMAL HIGH (ref 65–99)

## 2016-09-25 LAB — BASIC METABOLIC PANEL
ANION GAP: 10 (ref 5–15)
BUN: 13 mg/dL (ref 6–20)
CALCIUM: 9 mg/dL (ref 8.9–10.3)
CO2: 30 mmol/L (ref 22–32)
Chloride: 98 mmol/L — ABNORMAL LOW (ref 101–111)
Creatinine, Ser: 0.78 mg/dL (ref 0.61–1.24)
GFR calc Af Amer: >60 mL/min (ref >60)
Glucose, Bld: 118 mg/dL — ABNORMAL HIGH (ref 65–99)
POTASSIUM: 3.9 mmol/L (ref 3.5–5.1)
SODIUM: 138 mmol/L (ref 135–145)

## 2016-09-25 LAB — CBC
HEMATOCRIT: 44.1 % (ref 39.0–52.0)
HEMOGLOBIN: 14.7 g/dL (ref 13.0–17.0)
MCH: 30.8 pg (ref 26.0–34.0)
MCHC: 33.3 g/dL (ref 30.0–36.0)
MCV: 92.3 fL (ref 78.0–100.0)
Platelets: 187 10*3/uL (ref 150–400)
RBC: 4.78 MIL/uL (ref 4.22–5.81)
RDW: 13.6 % (ref 11.5–15.5)
WBC: 15.9 10*3/uL — ABNORMAL HIGH (ref 4.0–10.5)

## 2016-09-25 LAB — HEPARIN LEVEL (UNFRACTIONATED): Heparin Unfractionated: 0.57 IU/mL (ref 0.30–0.70)

## 2016-09-25 NOTE — Progress Notes (Signed)
ANTICOAGULATION CONSULT NOTE -   Pharmacy Consult for Heparin Indication: pulmonary embolus  No Known Allergies  Patient Measurements: Height: 5\' 10"  (177.8 cm) Weight: 256 lb (116.1 kg) IBW/kg (Calculated) : 73 HEPARIN DW (KG): 98.7  Vital Signs: Temp: 98.1 F (36.7 C) (07/28 1148) Temp Source: Oral (07/28 1148) BP: 150/131 (07/28 1000) Pulse Rate: 100 (07/28 1000)  Labs:  Recent Labs  09/22/16 1400 09/23/16 1717 09/24/16 1638 09/25/16 0649 09/25/16 1129  HGB 15.7 14.6  --  14.7  --   HCT 46.6 43.2  --  44.1  --   PLT 249 209  --  187  --   LABPROT  --   --  14.3  --   --   INR  --   --  1.11  --   --   HEPARINUNFRC  --   --   --   --  0.57  CREATININE 0.78 0.78  --   --   --     Estimated Creatinine Clearance: 128.4 mL/min (by C-G formula based on SCr of 0.78 mg/dL).   Medical History: Past Medical History:  Diagnosis Date  . Diabetes mellitus without complication (HCC)   . Hyperlipidemia   . Hypertension   . PE (pulmonary embolism)     Medications:  Prescriptions Prior to Admission  Medication Sig Dispense Refill Last Dose  . aspirin EC 81 MG EC tablet Take 1 tablet (81 mg total) by mouth daily.   09/22/2016 at Unknown time  . atorvastatin (LIPITOR) 20 MG tablet TAKE 1 TABLET EVERY EVENING 90 tablet 1 09/21/2016 at Unknown time  . benazepril (LOTENSIN) 20 MG tablet TAKE 1 TABLET (20 MG TOTAL) BY MOUTH DAILY, NEEDS TO BE SEEN 90 tablet 0 09/21/2016 at Unknown time  . diltiazem (CARDIZEM CD) 300 MG 24 hr capsule TAKE 1 CAPSULE (300 MG TOTAL) BY MOUTH DAILY. 90 capsule 1 09/22/2016 at Unknown time  . docusate sodium (COLACE) 100 MG capsule Take 200 mg by mouth 2 (two) times daily.   09/21/2016 at Unknown time  . metFORMIN (GLUCOPHAGE-XR) 500 MG 24 hr tablet TAKE 1 TABLET (500 MG TOTAL) BY MOUTH DAILY WITH BREAKFAST. 90 tablet 3 09/21/2016 at Unknown time  . Multiple Vitamins-Minerals (ONE-A-DAY MENS HEALTH FORMULA PO) Take 1 tablet by mouth daily.   09/21/2016 at  Unknown time  . terbinafine (LAMISIL) 250 MG tablet Take 1 tablet (250 mg total) by mouth daily. 30 tablet 2 Past Week at Unknown time    Assessment: 56 yo male who had a tendon rupture of the left knee that was repaired 7/26. Patient started desaturation with ambulation, and diaphoresis with Physical Therapy. Patient has a history of clot with a previous surgery many years ago. CT angiogram shows bilateral pulmonary emboli. Pharmacy asked to dose heparin for patient. One dose of lovenox given at 1600 today;  Therefore,  will delay initiation of heparin to 0400 7/28.  HL is therapeutic.  Goal of Therapy:  Heparin level 0.3-0.7 units/ml Monitor platelets by anticoagulation protocol: Yes   Plan:  Continue heparin infusion at 1700 units/hr Check anti-Xa level daily while on heparin Continue to monitor H&H and platelets Elder CyphersLorie Lilyan Prete, BS [employer] BackPh[employer] D, BCPS Clinical Pharmacist Pager [phone] 09/25/2016,12:30 PM

## 2016-09-25 NOTE — Progress Notes (Signed)
Subjective: He says he feels a little better. He is not as short of breath. He had a little bit of shoulder pain but no chest pain. He has some pain in his leg which had surgery.  Objective: Vital signs in last 24 hours: Temp:  [97.7 F (36.5 C)-100.1 F (37.8 C)] 98.7 F (37.1 C) (07/28 0800) Pulse Rate:  [82-135] 93 (07/28 0700) Resp:  [14-20] 14 (07/28 0700) BP: (139-173)/(54-94) 149/89 (07/28 0600) SpO2:  [79 %-97 %] 97 % (07/28 0700) FiO2 (%):  [3 %] 3 % (07/27 1522) Weight change:  Last BM Date: 09/21/16  Intake/Output from previous day: 07/27 0701 - 07/28 0700 In: 508.6 [P.O.:480; I.V.:28.6] Out: 1250 [Urine:1250]  PHYSICAL EXAM General appearance: alert, cooperative and no distress Resp: clear to auscultation bilaterally Cardio: His heart is regular but he does get tachycardic with talking. GI: soft, non-tender; bowel sounds normal; no masses,  no organomegaly Extremities: No swelling of his legs. He has a bandage over his quadriceps Skin warm and dry  Lab Results:  Results for orders placed or performed during the hospital encounter of 09/22/16 (from the past 48 hour(s))  Glucose, capillary     Status: None   Collection Time: 09/23/16 11:34 AM  Result Value Ref Range   Glucose-Capillary 97 65 - 99 mg/dL   Comment 1 Notify RN    Comment 2 Document in Chart   Glucose, capillary     Status: None   Collection Time: 09/23/16  1:24 PM  Result Value Ref Range   Glucose-Capillary 99 65 - 99 mg/dL  Glucose, capillary     Status: Abnormal   Collection Time: 09/23/16  3:21 PM  Result Value Ref Range   Glucose-Capillary 119 (H) 65 - 99 mg/dL  CBC     Status: Abnormal   Collection Time: 09/23/16  5:17 PM  Result Value Ref Range   WBC 15.2 (H) 4.0 - 10.5 K/uL   RBC 4.74 4.22 - 5.81 MIL/uL   Hemoglobin 14.6 13.0 - 17.0 g/dL   HCT 43.2 39.0 - 52.0 %   MCV 91.1 78.0 - 100.0 fL   MCH 30.8 26.0 - 34.0 pg   MCHC 33.8 30.0 - 36.0 g/dL   RDW 13.7 11.5 - 15.5 %   Platelets  209 150 - 400 K/uL  Creatinine, serum     Status: None   Collection Time: 09/23/16  5:17 PM  Result Value Ref Range   Creatinine, Ser 0.78 0.61 - 1.24 mg/dL   GFR calc non Af Amer >60 >60 mL/min   GFR calc Af Amer >60 >60 mL/min    Comment: (NOTE) The eGFR has been calculated using the CKD EPI equation. This calculation has not been validated in all clinical situations. eGFR's persistently <60 mL/min signify possible Chronic Kidney Disease.   Glucose, capillary     Status: Abnormal   Collection Time: 09/23/16  8:21 PM  Result Value Ref Range   Glucose-Capillary 147 (H) 65 - 99 mg/dL  Glucose, capillary     Status: Abnormal   Collection Time: 09/24/16  8:08 AM  Result Value Ref Range   Glucose-Capillary 115 (H) 65 - 99 mg/dL  Glucose, capillary     Status: Abnormal   Collection Time: 09/24/16 11:10 AM  Result Value Ref Range   Glucose-Capillary 152 (H) 65 - 99 mg/dL  Glucose, capillary     Status: Abnormal   Collection Time: 09/24/16  4:04 PM  Result Value Ref Range   Glucose-Capillary 114 (H)  65 - 99 mg/dL   Comment 1 Notify RN    Comment 2 Document in Chart   Protime-INR     Status: None   Collection Time: 09/24/16  4:38 PM  Result Value Ref Range   Prothrombin Time 14.3 11.4 - 15.2 seconds   INR 1.11   Glucose, capillary     Status: Abnormal   Collection Time: 09/24/16  8:55 PM  Result Value Ref Range   Glucose-Capillary 131 (H) 65 - 99 mg/dL   Comment 1 Notify RN    Comment 2 Document in Chart   CBC     Status: Abnormal   Collection Time: 09/25/16  6:49 AM  Result Value Ref Range   WBC 15.9 (H) 4.0 - 10.5 K/uL   RBC 4.78 4.22 - 5.81 MIL/uL   Hemoglobin 14.7 13.0 - 17.0 g/dL   HCT 44.1 39.0 - 52.0 %   MCV 92.3 78.0 - 100.0 fL   MCH 30.8 26.0 - 34.0 pg   MCHC 33.3 30.0 - 36.0 g/dL   RDW 13.6 11.5 - 15.5 %   Platelets 187 150 - 400 K/uL  Glucose, capillary     Status: Abnormal   Collection Time: 09/25/16  8:31 AM  Result Value Ref Range   Glucose-Capillary 132  (H) 65 - 99 mg/dL    ABGS No results for input(s): PHART, PO2ART, TCO2, HCO3 in the last 72 hours.  Invalid input(s): PCO2 CULTURES Recent Results (from the past 240 hour(s))  Surgical pcr screen     Status: None   Collection Time: 09/22/16  8:15 PM  Result Value Ref Range Status   MRSA, PCR NEGATIVE NEGATIVE Final   Staphylococcus aureus NEGATIVE NEGATIVE Final    Comment:        The Xpert SA Assay (FDA approved for NASAL specimens in patients over 30 years of age), is one component of a comprehensive surveillance program.  Test performance has been validated by [facility] for patients greater than or equal to 65 year old. It is not intended to diagnose infection nor to guide or monitor treatment.    Studies/Results: Dg Chest 1 View  Result Date: 09/24/2016 CLINICAL DATA:  Shortness of breath on exertion. EXAM: CHEST 1 VIEW COMPARISON:  09/22/2016 FINDINGS: Stable elevation of the right hemidiaphragm. Lungs are clear. Heart and mediastinum are within normal limits. The trachea is midline. No acute bone abnormality. IMPRESSION: No acute chest findings. Electronically Signed   By: Markus Daft M.D.   On: 09/24/2016 15:09   Ct Angio Chest Pe W Or Wo Contrast  Result Date: 09/24/2016 CLINICAL DATA:  Shortness of breath, history of pulmonary embolism, underwent knee surgery on 09/24/2016 EXAM: CT ANGIOGRAPHY CHEST WITH CONTRAST TECHNIQUE: Multidetector CT imaging of the chest was performed using the standard protocol during bolus administration of intravenous contrast. Multiplanar CT image reconstructions and MIPs were obtained to evaluate the vascular anatomy. CONTRAST:  200 mL Isovue-300  IV COMPARISON:  None. FINDINGS: Cardiovascular: Aorta normal caliber without aneurysm or dissection. Imaging following initial contrast bolus was suboptimal so a second contrast bolus and imaging were performed. Large filling defects identified within the pulmonary arteries consistent with pulmonary  emboli. This includes a large pulmonary embolus in the LEFT main pulmonary artery extending into LEFT upper lobe, a large embolus in the LEFT lower lobe, and centrally in the RIGHT upper lobe pulmonary artery. RV/LV ratio of 0.88. No pericardial effusion. Mediastinum/Nodes: No thoracic adenopathy. Esophagus unremarkable. Base of cervical region normal appearance. Lungs/Pleura:  Minimal dependent atelectasis in the lungs. No infiltrate, pleural effusion or pneumothorax. Upper Abdomen: Normal appearance Musculoskeletal: Mild degenerative disc disease changes thoracic spine. No acute osseous findings. Review of the MIP images confirms the above findings. IMPRESSION: Large BILATERAL pulmonary emboli as above with an RV/LV ratio = 0.88. Findings called to Dr. Aline Brochure on 09/24/2016 at 1515 hours. Electronically Signed   By: Lavonia Dana M.D.   On: 09/24/2016 15:19    Medications:  Prior to Admission:  Prescriptions Prior to Admission  Medication Sig Dispense Refill Last Dose  . aspirin EC 81 MG EC tablet Take 1 tablet (81 mg total) by mouth daily.   09/22/2016 at Unknown time  . atorvastatin (LIPITOR) 20 MG tablet TAKE 1 TABLET EVERY EVENING 90 tablet 1 09/21/2016 at Unknown time  . benazepril (LOTENSIN) 20 MG tablet TAKE 1 TABLET (20 MG TOTAL) BY MOUTH DAILY, NEEDS TO BE SEEN 90 tablet 0 09/21/2016 at Unknown time  . diltiazem (CARDIZEM CD) 300 MG 24 hr capsule TAKE 1 CAPSULE (300 MG TOTAL) BY MOUTH DAILY. 90 capsule 1 09/22/2016 at Unknown time  . docusate sodium (COLACE) 100 MG capsule Take 200 mg by mouth 2 (two) times daily.   09/21/2016 at Unknown time  . metFORMIN (GLUCOPHAGE-XR) 500 MG 24 hr tablet TAKE 1 TABLET (500 MG TOTAL) BY MOUTH DAILY WITH BREAKFAST. 90 tablet 3 09/21/2016 at Unknown time  . Multiple Vitamins-Minerals (ONE-A-DAY MENS HEALTH FORMULA PO) Take 1 tablet by mouth daily.   09/21/2016 at Unknown time  . terbinafine (LAMISIL) 250 MG tablet Take 1 tablet (250 mg total) by mouth daily. 30  tablet 2 Past Week at Unknown time   Scheduled: . atorvastatin  20 mg Oral QPM  . benazepril  20 mg Oral Daily  . diltiazem  300 mg Oral Daily  . docusate sodium  200 mg Oral BID  . metFORMIN  500 mg Oral Q breakfast  . multivitamin with minerals   Oral Daily  . terbinafine  250 mg Oral Daily   Continuous: . heparin 1,700 Units/hr (09/25/16 0419)   TML:[id number] **OR** acetaminophen, HYDROcodone-acetaminophen, HYDROmorphone (DILAUDID) injection, oxyCODONE-acetaminophen, promethazine  Assesment: He had knee injury and had surgery for that. He had postop pulmonary embolus despite getting DVT prophylaxis. He has a history of a previous pulmonary embolus after surgery. He says he feels okay. He still has some tachycardia when he talks. He is on diltiazem at baseline. He has hypertension and diabetes at baseline. Active Problems:   Knee injury   Quadriceps muscle rupture, left, initial encounter    Plan: Continue heparin. Bed rest today. Check echocardiogram    LOS: 1 day   Davionna Blacksher L 09/25/2016, 9:46 AM
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
## 2016-09-26 ENCOUNTER — Observation Stay (HOSPITAL_BASED_OUTPATIENT_CLINIC_OR_DEPARTMENT_OTHER): Payer: BC Managed Care – PPO

## 2016-09-26 DIAGNOSIS — R0600 Dyspnea, unspecified: Secondary | ICD-10-CM

## 2016-09-26 LAB — CBC WITH DIFFERENTIAL/PLATELET
Basophils Absolute: 0 10*3/uL (ref 0.0–0.1)
Basophils Relative: 0 %
EOS ABS: 0.5 10*3/uL (ref 0.0–0.7)
EOS PCT: 3 %
HCT: 43.3 % (ref 39.0–52.0)
HEMOGLOBIN: 14.4 g/dL (ref 13.0–17.0)
LYMPHS PCT: 18 %
Lymphs Abs: 2.6 10*3/uL (ref 0.7–4.0)
MCH: 30.6 pg (ref 26.0–34.0)
MCHC: 33.3 g/dL (ref 30.0–36.0)
MCV: 92.1 fL (ref 78.0–100.0)
MONOS PCT: 13 %
Monocytes Absolute: 1.9 10*3/uL — ABNORMAL HIGH (ref 0.1–1.0)
NEUTROS PCT: 66 %
Neutro Abs: 9.5 10*3/uL — ABNORMAL HIGH (ref 1.7–7.7)
Platelets: 204 10*3/uL (ref 150–400)
RBC: 4.7 MIL/uL (ref 4.22–5.81)
RDW: 13.7 % (ref 11.5–15.5)
WBC: 14.6 10*3/uL — ABNORMAL HIGH (ref 4.0–10.5)

## 2016-09-26 LAB — HEPARIN LEVEL (UNFRACTIONATED)
Heparin Unfractionated: 0.25 IU/mL — ABNORMAL LOW (ref 0.30–0.70)
Heparin Unfractionated: 0.27 IU/mL — ABNORMAL LOW (ref 0.30–0.70)
Heparin Unfractionated: 0.52 IU/mL (ref 0.30–0.70)

## 2016-09-26 LAB — GLUCOSE, CAPILLARY
GLUCOSE-CAPILLARY: 114 mg/dL — AB (ref 65–99)
GLUCOSE-CAPILLARY: 119 mg/dL — AB (ref 65–99)

## 2016-09-26 LAB — ECHOCARDIOGRAM COMPLETE
HEIGHTINCHES: 70 in
WEIGHTICAEL: 4096 [oz_av]

## 2016-09-26 MED ORDER — HEPARIN BOLUS VIA INFUSION
1500.0000 [IU] | Freq: Once | INTRAVENOUS | Status: AC
  Administered 2016-09-26: 1500 [IU] via INTRAVENOUS
  Filled 2016-09-26: qty 1500

## 2016-09-26 MED ORDER — PERFLUTREN LIPID MICROSPHERE
1.0000 mL | INTRAVENOUS | Status: AC | PRN
  Administered 2016-09-26: 2 mL via INTRAVENOUS
  Filled 2016-09-26: qty 10

## 2016-09-26 MED ORDER — HEPARIN BOLUS VIA INFUSION
2000.0000 [IU] | Freq: Once | INTRAVENOUS | Status: AC
  Administered 2016-09-26: 2000 [IU] via INTRAVENOUS
  Filled 2016-09-26: qty 2000

## 2016-09-26 NOTE — Progress Notes (Addendum)
ANTICOAGULATION CONSULT NOTE -   Pharmacy Consult for Heparin Indication: pulmonary embolus  No Known Allergies  Patient Measurements: Height: 5\' 10"  (177.8 cm) Weight: 256 lb (116.1 kg) IBW/kg (Calculated) : 73 HEPARIN DW (KG): 98.7  Vital Signs: BP: 117/75 (07/29 1400) Pulse Rate: 84 (07/29 1500)  Labs:  Recent Labs  09/23/16 1717 09/24/16 1638 09/25/16 0649 09/25/16 1129 09/25/16 1706 09/26/16 0526 09/26/16 1404  HGB 14.6  --  14.7  --   --  14.4  --   HCT 43.2  --  44.1  --   --  43.3  --   PLT 209  --  187  --   --  204  --   LABPROT  --  14.3  --   --   --   --   --   INR  --  1.11  --   --   --   --   --   HEPARINUNFRC  --   --   --  0.57  --  0.25* 0.27*  CREATININE 0.78  --   --   --  0.78  --   --     Estimated Creatinine Clearance: 128.4 mL/min (by C-G formula based on SCr of 0.78 mg/dL).   Medical History: Past Medical History:  Diagnosis Date  . Diabetes mellitus without complication (HCC)   . Hyperlipidemia   . Hypertension   . PE (pulmonary embolism)     Medications:  Prescriptions Prior to Admission  Medication Sig Dispense Refill Last Dose  . aspirin EC 81 MG EC tablet Take 1 tablet (81 mg total) by mouth daily.   09/22/2016 at Unknown time  . atorvastatin (LIPITOR) 20 MG tablet TAKE 1 TABLET EVERY EVENING 90 tablet 1 09/21/2016 at Unknown time  . benazepril (LOTENSIN) 20 MG tablet TAKE 1 TABLET (20 MG TOTAL) BY MOUTH DAILY, NEEDS TO BE SEEN 90 tablet 0 09/21/2016 at Unknown time  . diltiazem (CARDIZEM CD) 300 MG 24 hr capsule TAKE 1 CAPSULE (300 MG TOTAL) BY MOUTH DAILY. 90 capsule 1 09/22/2016 at Unknown time  . docusate sodium (COLACE) 100 MG capsule Take 200 mg by mouth 2 (two) times daily.   09/21/2016 at Unknown time  . metFORMIN (GLUCOPHAGE-XR) 500 MG 24 hr tablet TAKE 1 TABLET (500 MG TOTAL) BY MOUTH DAILY WITH BREAKFAST. 90 tablet 3 09/21/2016 at Unknown time  . Multiple Vitamins-Minerals (ONE-A-DAY MENS HEALTH FORMULA PO) Take 1 tablet by  mouth daily.   09/21/2016 at Unknown time  . terbinafine (LAMISIL) 250 MG tablet Take 1 tablet (250 mg total) by mouth daily. 30 tablet 2 Past Week at Unknown time    Assessment: 56 yo male who had a tendon rupture of the left knee that was repaired 7/26. Patient started desaturation with ambulation, and diaphoresis with Physical Therapy. Patient has a history of clot with a previous surgery many years ago. CT angiogram shows bilateral pulmonary emboli. Pharmacy asked to dose heparin for patient. One dose of lovenox given at 1600 today;  Therefore,  will delay initiation of heparin to 0400 7/28.  HL  is still subtherapeutic.  Goal of Therapy:  Heparin level 0.3-0.7 units/ml Monitor platelets by anticoagulation protocol: Yes   Plan:  Bolus 2000 units then increase heparin infusion at 2100 units/hr Check anti-Xa level  In 6-8 hours and daily while on heparin Continue to monitor H&H and platelets Elder CyphersLorie Cimberly Stoffel, BS [employer]ra BackPharm D, BCPS Clinical Pharmacist Pager [phone] 09/26/2016,3:48 PM
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
## 2016-09-26 NOTE — Progress Notes (Signed)
ANTICOAGULATION CONSULT NOTE -   Pharmacy Consult for Heparin Indication: pulmonary embolus  No Known Allergies  Patient Measurements: Height: 5\' 10"  (177.8 cm) Weight: 256 lb (116.1 kg) IBW/kg (Calculated) : 73 HEPARIN DW (KG): 98.7  Vital Signs:    Labs:  Recent Labs  09/23/16 1717 09/24/16 1638 09/25/16 0649 09/25/16 1129 09/25/16 1706 09/26/16 0526  HGB 14.6  --  14.7  --   --  14.4  HCT 43.2  --  44.1  --   --  43.3  PLT 209  --  187  --   --  204  LABPROT  --  14.3  --   --   --   --   INR  --  1.11  --   --   --   --   HEPARINUNFRC  --   --   --  0.57  --  0.25*  CREATININE 0.78  --   --   --  0.78  --     Estimated Creatinine Clearance: 128.4 mL/min (by C-G formula based on SCr of 0.78 mg/dL).   Medical History: Past Medical History:  Diagnosis Date  . Diabetes mellitus without complication (HCC)   . Hyperlipidemia   . Hypertension   . PE (pulmonary embolism)     Medications:  Prescriptions Prior to Admission  Medication Sig Dispense Refill Last Dose  . aspirin EC 81 MG EC tablet Take 1 tablet (81 mg total) by mouth daily.   09/22/2016 at Unknown time  . atorvastatin (LIPITOR) 20 MG tablet TAKE 1 TABLET EVERY EVENING 90 tablet 1 09/21/2016 at Unknown time  . benazepril (LOTENSIN) 20 MG tablet TAKE 1 TABLET (20 MG TOTAL) BY MOUTH DAILY, NEEDS TO BE SEEN 90 tablet 0 09/21/2016 at Unknown time  . diltiazem (CARDIZEM CD) 300 MG 24 hr capsule TAKE 1 CAPSULE (300 MG TOTAL) BY MOUTH DAILY. 90 capsule 1 09/22/2016 at Unknown time  . docusate sodium (COLACE) 100 MG capsule Take 200 mg by mouth 2 (two) times daily.   09/21/2016 at Unknown time  . metFORMIN (GLUCOPHAGE-XR) 500 MG 24 hr tablet TAKE 1 TABLET (500 MG TOTAL) BY MOUTH DAILY WITH BREAKFAST. 90 tablet 3 09/21/2016 at Unknown time  . Multiple Vitamins-Minerals (ONE-A-DAY MENS HEALTH FORMULA PO) Take 1 tablet by mouth daily.   09/21/2016 at Unknown time  . terbinafine (LAMISIL) 250 MG tablet Take 1 tablet (250  mg total) by mouth daily. 30 tablet 2 Past Week at Unknown time    Assessment: 56 yo male who had a tendon rupture of the left knee that was repaired 7/26. Patient started desaturation with ambulation, and diaphoresis with Physical Therapy. Patient has a history of clot with a previous surgery many years ago. CT angiogram shows bilateral pulmonary emboli. Pharmacy asked to dose heparin for patient. One dose of lovenox given at 1600 today;  Therefore,  will delay initiation of heparin to 0400 7/28.  HL this AM is subtherapeutic.  Goal of Therapy:  Heparin level 0.3-0.7 units/ml Monitor platelets by anticoagulation protocol: Yes   Plan:  Bolus 1500 units then increase heparin infusion at 1900 units/hr Check anti-Xa level  In 6-8 hours and daily while on heparin Continue to monitor H&H and platelets Elder CyphersLorie Yamili Lichtenwalner, BS [employer] BackPharm D, BCPS Clinical Pharmacist Pager [phone] 09/26/2016,7:45 AM

## 2016-09-26 NOTE — Progress Notes (Signed)
  Echocardiogram 2D Echocardiogram has been performed.  Brian Roach 09/26/2016, 9:47 AM

## 2016-09-26 NOTE — Progress Notes (Signed)
Subjective: He says he feels okay. No chest pain. No shortness of breath. No other new complaints. He still has some pain in his knee. His breathing is okay.  Objective: Vital signs in last 24 hours: Temp:  [98.1 F (36.7 C)-98.8 F (37.1 C)] 98.8 F (37.1 C) (07/28 1700) Pulse Rate:  [77-120] 77 (07/29 0800) Resp:  [13-20] 14 (07/29 0800) BP: (150)/(131) 150/131 (07/28 1000) SpO2:  [96 %-98 %] 97 % (07/29 0800) FiO2 (%):  [32 %] 32 % (07/29 0800) Weight change:  Last BM Date: 09/21/16  Intake/Output from previous day: 07/28 0701 - 07/29 0700 In: 1361 [P.O.:1140; I.V.:221] Out: 1235 [Urine:1235]  PHYSICAL EXAM General appearance: alert, cooperative and no distress Resp: clear to auscultation bilaterally Cardio: regular rate and rhythm, S1, S2 normal, no murmur, click, rub or gallop GI: soft, non-tender; bowel sounds normal; no masses,  no organomegaly Extremities: His left leg is in a Ace wrap Skin warm and dry  Lab Results:  Results for orders placed or performed during the hospital encounter of 09/22/16 (from the past 48 hour(s))  Glucose, capillary     Status: Abnormal   Collection Time: 09/24/16 11:10 AM  Result Value Ref Range   Glucose-Capillary 152 (H) 65 - 99 mg/dL  Glucose, capillary     Status: Abnormal   Collection Time: 09/24/16  4:04 PM  Result Value Ref Range   Glucose-Capillary 114 (H) 65 - 99 mg/dL   Comment 1 Notify RN    Comment 2 Document in Chart   Protime-INR     Status: None   Collection Time: 09/24/16  4:38 PM  Result Value Ref Range   Prothrombin Time 14.3 11.4 - 15.2 seconds   INR 1.11   Glucose, capillary     Status: Abnormal   Collection Time: 09/24/16  8:55 PM  Result Value Ref Range   Glucose-Capillary 131 (H) 65 - 99 mg/dL   Comment 1 Notify RN    Comment 2 Document in Chart   CBC     Status: Abnormal   Collection Time: 09/25/16  6:49 AM  Result Value Ref Range   WBC 15.9 (H) 4.0 - 10.5 K/uL   RBC 4.78 4.22 - 5.81 MIL/uL    Hemoglobin 14.7 13.0 - 17.0 g/dL   HCT 44.1 39.0 - 52.0 %   MCV 92.3 78.0 - 100.0 fL   MCH 30.8 26.0 - 34.0 pg   MCHC 33.3 30.0 - 36.0 g/dL   RDW 13.6 11.5 - 15.5 %   Platelets 187 150 - 400 K/uL  Glucose, capillary     Status: Abnormal   Collection Time: 09/25/16  8:31 AM  Result Value Ref Range   Glucose-Capillary 132 (H) 65 - 99 mg/dL  Heparin level (unfractionated)     Status: None   Collection Time: 09/25/16 11:29 AM  Result Value Ref Range   Heparin Unfractionated 0.57 0.30 - 0.70 IU/mL    Comment:        IF HEPARIN RESULTS ARE BELOW EXPECTED VALUES, AND PATIENT DOSAGE HAS BEEN CONFIRMED, SUGGEST FOLLOW UP TESTING OF ANTITHROMBIN III LEVELS.   Glucose, capillary     Status: Abnormal   Collection Time: 09/25/16 11:38 AM  Result Value Ref Range   Glucose-Capillary 133 (H) 65 - 99 mg/dL  Basic metabolic panel     Status: Abnormal   Collection Time: 09/25/16  5:06 PM  Result Value Ref Range   Sodium 138 135 - 145 mmol/L   Potassium 3.9 3.5 -  5.1 mmol/L   Chloride 98 (L) 101 - 111 mmol/L   CO2 30 22 - 32 mmol/L   Glucose, Bld 118 (H) 65 - 99 mg/dL   BUN 13 6 - 20 mg/dL   Creatinine, Ser 0.78 0.61 - 1.24 mg/dL   Calcium 9.0 8.9 - 10.3 mg/dL   GFR calc non Af Amer >60 >60 mL/min   GFR calc Af Amer >60 >60 mL/min    Comment: (NOTE) The eGFR has been calculated using the CKD EPI equation. This calculation has not been validated in all clinical situations. eGFR's persistently <60 mL/min signify possible Chronic Kidney Disease.    Anion gap 10 5 - 15  Glucose, capillary     Status: Abnormal   Collection Time: 09/25/16  5:06 PM  Result Value Ref Range   Glucose-Capillary 122 (H) 65 - 99 mg/dL  Heparin level (unfractionated)     Status: Abnormal   Collection Time: 09/26/16  5:26 AM  Result Value Ref Range   Heparin Unfractionated 0.25 (L) 0.30 - 0.70 IU/mL    Comment:        IF HEPARIN RESULTS ARE BELOW EXPECTED VALUES, AND PATIENT DOSAGE HAS BEEN CONFIRMED, SUGGEST  FOLLOW UP TESTING OF ANTITHROMBIN III LEVELS.   CBC with Differential/Platelet     Status: Abnormal   Collection Time: 09/26/16  5:26 AM  Result Value Ref Range   WBC 14.6 (H) 4.0 - 10.5 K/uL   RBC 4.70 4.22 - 5.81 MIL/uL   Hemoglobin 14.4 13.0 - 17.0 g/dL   HCT 43.3 39.0 - 52.0 %   MCV 92.1 78.0 - 100.0 fL   MCH 30.6 26.0 - 34.0 pg   MCHC 33.3 30.0 - 36.0 g/dL   RDW 13.7 11.5 - 15.5 %   Platelets 204 150 - 400 K/uL   Neutrophils Relative % 66 %   Neutro Abs 9.5 (H) 1.7 - 7.7 K/uL   Lymphocytes Relative 18 %   Lymphs Abs 2.6 0.7 - 4.0 K/uL   Monocytes Relative 13 %   Monocytes Absolute 1.9 (H) 0.1 - 1.0 K/uL   Eosinophils Relative 3 %   Eosinophils Absolute 0.5 0.0 - 0.7 K/uL   Basophils Relative 0 %   Basophils Absolute 0.0 0.0 - 0.1 K/uL  Glucose, capillary     Status: Abnormal   Collection Time: 09/26/16  6:46 AM  Result Value Ref Range   Glucose-Capillary 114 (H) 65 - 99 mg/dL    ABGS No results for input(s): PHART, PO2ART, TCO2, HCO3 in the last 72 hours.  Invalid input(s): PCO2 CULTURES Recent Results (from the past 240 hour(s))  Surgical pcr screen     Status: None   Collection Time: 09/22/16  8:15 PM  Result Value Ref Range Status   MRSA, PCR NEGATIVE NEGATIVE Final   Staphylococcus aureus NEGATIVE NEGATIVE Final    Comment:        The Xpert SA Assay (FDA approved for NASAL specimens in patients over 88 years of age), is one component of a comprehensive surveillance program.  Test performance has been validated by [facility] for patients greater than or equal to 60 year old. It is not intended to diagnose infection nor to guide or monitor treatment.    Studies/Results: Dg Chest 1 View  Result Date: 09/24/2016 CLINICAL DATA:  Shortness of breath on exertion. EXAM: CHEST 1 VIEW COMPARISON:  09/22/2016 FINDINGS: Stable elevation of the right hemidiaphragm. Lungs are clear. Heart and mediastinum are within normal limits. The trachea is  midline. No acute  bone abnormality. IMPRESSION: No acute chest findings. Electronically Signed   By: Markus Daft M.D.   On: 09/24/2016 15:09   Ct Angio Chest Pe W Or Wo Contrast  Result Date: 09/24/2016 CLINICAL DATA:  Shortness of breath, history of pulmonary embolism, underwent knee surgery on 09/24/2016 EXAM: CT ANGIOGRAPHY CHEST WITH CONTRAST TECHNIQUE: Multidetector CT imaging of the chest was performed using the standard protocol during bolus administration of intravenous contrast. Multiplanar CT image reconstructions and MIPs were obtained to evaluate the vascular anatomy. CONTRAST:  200 mL Isovue-300  IV COMPARISON:  None. FINDINGS: Cardiovascular: Aorta normal caliber without aneurysm or dissection. Imaging following initial contrast bolus was suboptimal so a second contrast bolus and imaging were performed. Large filling defects identified within the pulmonary arteries consistent with pulmonary emboli. This includes a large pulmonary embolus in the LEFT main pulmonary artery extending into LEFT upper lobe, a large embolus in the LEFT lower lobe, and centrally in the RIGHT upper lobe pulmonary artery. RV/LV ratio of 0.88. No pericardial effusion. Mediastinum/Nodes: No thoracic adenopathy. Esophagus unremarkable. Base of cervical region normal appearance. Lungs/Pleura: Minimal dependent atelectasis in the lungs. No infiltrate, pleural effusion or pneumothorax. Upper Abdomen: Normal appearance Musculoskeletal: Mild degenerative disc disease changes thoracic spine. No acute osseous findings. Review of the MIP images confirms the above findings. IMPRESSION: Large BILATERAL pulmonary emboli as above with an RV/LV ratio = 0.88. Findings called to Dr. Aline Brochure on 09/24/2016 at 1515 hours. Electronically Signed   By: Lavonia Dana M.D.   On: 09/24/2016 15:19    Medications:  Prior to Admission:  Prescriptions Prior to Admission  Medication Sig Dispense Refill Last Dose  . aspirin EC 81 MG EC tablet Take 1 tablet (81 mg  total) by mouth daily.   09/22/2016 at Unknown time  . atorvastatin (LIPITOR) 20 MG tablet TAKE 1 TABLET EVERY EVENING 90 tablet 1 09/21/2016 at Unknown time  . benazepril (LOTENSIN) 20 MG tablet TAKE 1 TABLET (20 MG TOTAL) BY MOUTH DAILY, NEEDS TO BE SEEN 90 tablet 0 09/21/2016 at Unknown time  . diltiazem (CARDIZEM CD) 300 MG 24 hr capsule TAKE 1 CAPSULE (300 MG TOTAL) BY MOUTH DAILY. 90 capsule 1 09/22/2016 at Unknown time  . docusate sodium (COLACE) 100 MG capsule Take 200 mg by mouth 2 (two) times daily.   09/21/2016 at Unknown time  . metFORMIN (GLUCOPHAGE-XR) 500 MG 24 hr tablet TAKE 1 TABLET (500 MG TOTAL) BY MOUTH DAILY WITH BREAKFAST. 90 tablet 3 09/21/2016 at Unknown time  . Multiple Vitamins-Minerals (ONE-A-DAY MENS HEALTH FORMULA PO) Take 1 tablet by mouth daily.   09/21/2016 at Unknown time  . terbinafine (LAMISIL) 250 MG tablet Take 1 tablet (250 mg total) by mouth daily. 30 tablet 2 Past Week at Unknown time   Scheduled: . atorvastatin  20 mg Oral QPM  . benazepril  20 mg Oral Daily  . diltiazem  300 mg Oral Daily  . docusate sodium  200 mg Oral BID  . metFORMIN  500 mg Oral Q breakfast  . multivitamin with minerals   Oral Daily  . terbinafine  250 mg Oral Daily   Continuous: . heparin 1,900 Units/hr (09/26/16 0935)   KCL:[id number] **OR** acetaminophen, HYDROcodone-acetaminophen, HYDROmorphone (DILAUDID) injection, oxyCODONE-acetaminophen, perflutren lipid microspheres (DEFINITY) IV suspension, promethazine  Assesment: He was admitted with a knee injury and quadriceps muscle rupture on the left. He had surgery and then the next day when he was getting up with physical therapy he had a  pulmonary embolus. He is on heparin. Echocardiogram is being done now. He has no complaints. At baseline he has diabetes which is pretty well controlled. His blood pressure has been good except for a pressure that I believe is an artifact listed at 150/131. Active Problems:   Knee injury    Quadriceps muscle rupture, left, initial encounter    Plan: Continue current treatments continue heparin he can get up today.    LOS: 1 day   Hanish Laraia L 09/26/2016, 9:43 AM

## 2016-09-26 NOTE — Progress Notes (Addendum)
Physical Therapy Treatment Patient Details Name: Brian Roach MRN: [id number] DOB: April 25, 1960 Today's Date: 09/26/2016    History of Present Illness Brian Roach is a 56yo white male who was walking his dog in flip flops and fell onto a flexed knee, ultimately arriving at the ED [facility] on 7/25. Ortho consult reports full rupture of the quadriceps tendon from the patella and some adjacent soft tissue injury. Pt presents on 7/27, POD1 s/p patellar tendon repair. PMH: DM, PE, HTN, HLD, Rt ACL surgery (~15ya c subsequent PE), back surgery.     PT Comments    Noted that surgeon and pulmonary MDs have both cleared patient to participate in activity; surgeon has added in his notes to resume PT specifically on 09/26/16, and this DPT has contacted him for a new PT order moving forward. Spoke to RN, who reports patient is doing well and is safe to participate in PT, RN also present during PT session. Patient received supine in bed, pleasant and willing to participate in skilled PT session, noted all vitals as being WNL at baseline supine rest. Tightened knee immobilizer before providing patient with min guard assist to sit up at EOB; min assist then provided to stand from low surface, however noted that HR had increased to 130s with standing. Returned to sitting position and noted HR remaining around 115-125 with seated rest, RN present and clears patient to continue with PT. Performed stand-pivot transfer to chair with min guard and walker today, RN assisting in line management. Patient left sitting upright in chair with RN present and preparing to work with patient, all other needs met/concerns otherwise addressed. Patient appears to have medically improved however continues to demonstrate generalized weakness, difficulty walking, and some unsteadiness; given that prior level of impairment may have been related to acute PE, and expectation of relative improvement since PE has been medically addressed, will  continue to closely monitor functional ability and will modify DC plan moving forward as appropriate.    Follow Up Recommendations  Other (comment) (TBD as patient progresses )     Equipment Recommendations  Rolling walker with 5" wheels;3in1 (PT)    Recommendations for Other Services       Precautions / Restrictions Precautions Precautions: Fall Required Braces or Orthoses: Knee Immobilizer - Left Knee Immobilizer - Left: On at all times Restrictions Weight Bearing Restrictions: Yes LLE Weight Bearing: Weight bearing as tolerated    Mobility  Bed Mobility Overal bed mobility: Needs Assistance Bed Mobility: Supine to Sit     Supine to sit: Min guard        Transfers Overall transfer level: Needs assistance Equipment used: Rolling walker (2 wheeled) Transfers: Sit to/from Stand Sit to Stand: Min assist Stand pivot transfers: Min guard       General transfer comment: min assist to stand from low surface; min guard for actual stand pivot transfer to chair in ICU; difficulty bearing weight L LE   Ambulation/Gait                 Stairs            Wheelchair Mobility    Modified Rankin (Stroke Patients Only)       Balance Overall balance assessment: Needs assistance Sitting-balance support: Bilateral upper extremity supported Sitting balance-Leahy Scale: Good     Standing balance support: Bilateral upper extremity supported Standing balance-Leahy Scale: Fair  Cognition Arousal/Alertness: Awake/alert Behavior During Therapy: WFL for tasks assessed/performed Overall Cognitive Status: Within Functional Limits for tasks assessed                                        Exercises      General Comments        Pertinent Vitals/Pain Pain Assessment: No/denies pain    Home Living                      Prior Function            PT Goals (current goals can now be found in  the care plan section) Acute Rehab PT Goals Patient Stated Goal: return to home PT Goal Formulation: With patient Time For Goal Achievement: 10/08/16 Potential to Achieve Goals: Good Progress towards PT goals: Not progressing toward goals - comment (complicated by medical status now currently controlled by MDs )    Frequency    BID      PT Plan Current plan remains appropriate    Co-evaluation              AM-PAC PT "6 Clicks" Daily Activity  Outcome Measure  Difficulty turning over in bed (including adjusting bedclothes, sheets and blankets)?: A Little Difficulty moving from lying on back to sitting on the side of the bed? : A Little Difficulty sitting down on and standing up from a chair with arms (e.g., wheelchair, bedside commode, etc,.)?: A Lot Help needed moving to and from a bed to chair (including a wheelchair)?: A Lot Help needed walking in hospital room?: A Lot Help needed climbing 3-5 steps with a railing? : A Lot 6 Click Score: 14    End of Session Equipment Utilized During Treatment: Gait belt;Oxygen;Left knee immobilizer Activity Tolerance: Patient tolerated treatment well Patient left: in chair;with nursing/sitter in room Nurse Communication: Other (comment) (RN present during session, aware of patient's mobility status ) PT Visit Diagnosis: Muscle weakness (generalized) (M62.81);Difficulty in walking, not elsewhere classified (R26.2);Unsteadiness on feet (R26.81)     Time: 1040-1105 PT Time Calculation (min) (ACUTE ONLY): 25 min  Charges:  $Therapeutic Activity: 23-37 mins                    G Codes:  Functional Assessment Tool Used: AM-PAC 6 Clicks Basic Mobility;Clinical judgement Functional Limitation: Mobility: Walking and moving around Mobility: Walking and Moving Around Current Status (B5339): At least 60 percent but less than 80 percent impaired, limited or restricted Mobility: Walking and Moving Around Goal Status [phone]): At least 40  percent but less than 60 percent impaired, limited or restricted    Deniece Ree PT, DPT [phone]

## 2016-09-27 ENCOUNTER — Ambulatory Visit: Payer: BC Managed Care – PPO | Admitting: Family Medicine

## 2016-09-27 LAB — GLUCOSE, CAPILLARY
GLUCOSE-CAPILLARY: 114 mg/dL — AB (ref 65–99)
Glucose-Capillary: 112 mg/dL — ABNORMAL HIGH (ref 65–99)
Glucose-Capillary: 115 mg/dL — ABNORMAL HIGH (ref 65–99)

## 2016-09-27 LAB — CBC WITH DIFFERENTIAL/PLATELET
BASOS ABS: 0 10*3/uL (ref 0.0–0.1)
Basophils Relative: 0 %
EOS PCT: 4 %
Eosinophils Absolute: 0.5 10*3/uL (ref 0.0–0.7)
HCT: 42.1 % (ref 39.0–52.0)
HEMOGLOBIN: 13.8 g/dL (ref 13.0–17.0)
LYMPHS ABS: 2.8 10*3/uL (ref 0.7–4.0)
LYMPHS PCT: 23 %
MCH: 30.3 pg (ref 26.0–34.0)
MCHC: 32.8 g/dL (ref 30.0–36.0)
MCV: 92.3 fL (ref 78.0–100.0)
Monocytes Absolute: 1.5 10*3/uL — ABNORMAL HIGH (ref 0.1–1.0)
Monocytes Relative: 12 %
NEUTROS PCT: 61 %
Neutro Abs: 7.5 10*3/uL (ref 1.7–7.7)
PLATELETS: 247 10*3/uL (ref 150–400)
RBC: 4.56 MIL/uL (ref 4.22–5.81)
RDW: 13.7 % (ref 11.5–15.5)
WBC: 12.3 10*3/uL — AB (ref 4.0–10.5)

## 2016-09-27 LAB — HEPARIN LEVEL (UNFRACTIONATED): HEPARIN UNFRACTIONATED: 0.56 [IU]/mL (ref 0.30–0.70)

## 2016-09-27 MED ORDER — HYDROCODONE-ACETAMINOPHEN 7.5-325 MG PO TABS
1.0000 | ORAL_TABLET | ORAL | Status: DC | PRN
  Administered 2016-09-27: 1 via ORAL
  Filled 2016-09-27: qty 1

## 2016-09-27 NOTE — Progress Notes (Signed)
Physical Therapy Treatment Patient Details Name: Brian BuffWilliam F Springborn MRN: [id number] DOB: 12/31/1960 Today's Date: 09/27/2016    History of Present Illness Lambert KetoWilliam Pontarelli is a 56yo white male who was walking his dog in flip flops and fell onto a flexed knee, ultimately arriving at the ED [facility] on 7/25. Ortho consult reports full rupture of the quadriceps tendon from the patella and some adjacent soft tissue injury. Pt presents on 7/27, POD1 s/p patellar tendon repair. PMH: DM, PE, HTN, HLD, Rt ACL surgery (~15ya c subsequent PE), back surgery.  Pt was found to have 2 PE on POD1 shortly after PT eval and subsequently moved to step-down with 2D bedrest and therapeutic anticoagulation. Pt treatment resumed on 7/29.     PT Comments    Pt tolerating household distance amb in hallway on room air, heavy effort required and heavy use of BUE on RW for support, posture limited by chronic low back limitations, but VC and education given to limit overuse stress on Bilat shoulders during prolonged RW use. Pt reports pain stable with mobility. Hr 80s at rest on 3LPM, increases to 100s on room air at rest seated, and peaks in 130s during amb on room air. SpO2 remains >95% on room air throughout. Pt progressing toward goals. Functional mobility remains quite limited due to global weakness. Pt demonstrates safe use of A/E and transfers this session.     Follow Up Recommendations  Home health PT     Equipment Recommendations  Rolling walker with 5" wheels;3in1 (PT)    Recommendations for Other Services       Precautions / Restrictions Precautions Precautions: Fall Required Braces or Orthoses: Knee Immobilizer - Left Knee Immobilizer - Left: On at all times Restrictions Weight Bearing Restrictions: Yes LLE Weight Bearing: Weight bearing as tolerated    Mobility  Bed Mobility Overal bed mobility: Needs Assistance Bed Mobility: Sit to Supine       Sit to supine: Supervision   General bed mobility  comments: VC for technique and safety; successful leg hook for rotation into bed  Transfers Overall transfer level: Needs assistance Equipment used: Rolling walker (2 wheeled) Transfers: Sit to/from Stand Sit to Stand: From elevated surface;Supervision         General transfer comment: Performed 3 times from bed for practicing technique and strengthening  Ambulation/Gait Ambulation/Gait assistance: Supervision Ambulation Distance (Feet): 100 Feet Assistive device: Rolling walker (2 wheeled)   Gait velocity: 0.593m/s   General Gait Details: VC to correct abducted LLE; WBAT on LLE, KI donned adequately tight per patient    Stairs            Wheelchair Mobility    Modified Rankin (Stroke Patients Only)       Balance Overall balance assessment: Modified Independent Sitting-balance support: No upper extremity supported Sitting balance-Leahy Scale: Normal     Standing balance support: Bilateral upper extremity supported;During functional activity Standing balance-Leahy Scale: Good                              Cognition Arousal/Alertness: Awake/alert Behavior During Therapy: WFL for tasks assessed/performed Overall Cognitive Status: Within Functional Limits for tasks assessed                                        Exercises      General Comments  Pertinent Vitals/Pain Pain Assessment: 0-10 Pain Score: 3  Pain Location: Left knee/quads;  Pain Descriptors / Indicators: Operative site guarding;Burning;Aching;Sore Pain Intervention(s): Limited activity within patient's tolerance;Monitored during session;Premedicated before session    Home Living                      Prior Function            PT Goals (current goals can now be found in the care plan section) Acute Rehab PT Goals Patient Stated Goal: return to home PT Goal Formulation: With patient Time For Goal Achievement: 10/08/16 Potential to Achieve  Goals: Good Progress towards PT goals: Progressing toward goals    Frequency    7X/week      PT Plan Current plan remains appropriate    Co-evaluation              AM-PAC PT "6 Clicks" Daily Activity  Outcome Measure  Difficulty turning over in bed (including adjusting bedclothes, sheets and blankets)?: A Little Difficulty moving from lying on back to sitting on the side of the bed? : A Lot Difficulty sitting down on and standing up from a chair with arms (e.g., wheelchair, bedside commode, etc,.)?: A Lot Help needed moving to and from a bed to chair (including a wheelchair)?: A Lot Help needed walking in hospital room?: A Lot Help needed climbing 3-5 steps with a railing? : A Lot 6 Click Score: 13    End of Session Equipment Utilized During Treatment: Gait belt;Oxygen;Left knee immobilizer Activity Tolerance: Patient tolerated treatment well Patient left: in bed;with call bell/phone within reach Nurse Communication: Other (comment) (normal O2 sats on RA. ) PT Visit Diagnosis: Muscle weakness (generalized) (M62.81);Difficulty in walking, not elsewhere classified (R26.2);Unsteadiness on feet (R26.81)     Time: [id number] PT Time Calculation (min) (ACUTE ONLY): 28 min  Charges:  $Gait Training: 8-22 mins $Therapeutic Activity: 8-22 mins                    G Codes:       2:29 PM, 09/27/16 Rosamaria LintsAllan C Buccola, PT, DPT Physical Therapist -  [phone](ASCOM)  [phone] (Office)    Buccola,Allan C 09/27/2016, 2:24 PM

## 2016-09-27 NOTE — Progress Notes (Signed)
Patient ID: Brian Roach, male   DOB: 02/18/1961, 56 y.o.   MRN: [id number] Discharge info  WBAT WITH BRACE ON WITH WALKER   FU AUG 10  [phone]

## 2016-09-27 NOTE — Discharge Summary (Addendum)
Physician Discharge Summary  Patient ID: Vernia BuffWilliam F Terrill MRN: [id number] DOB/AGE: 58/06/1960 58 y.o.  Admit date: 09/22/2016 Discharge date: 09/27/2016  Admission Diagnoses: left quadriceps rupture  Discharge Diagnoses: same, pulmonary embolism Active Problems:   Knee injury   Quadriceps muscle rupture, left, initial encounter Acute pulmonary embolism Hypertension Diabetes  Discharged Condition: stable  Hospital Course:  Admitted on 09-22-16 for left quadriceps rupture On 7-26 he underwent uncomplicated left quadriceps repair On 7-27 POD 1 during PT evaluation he became SOB and hypoxic. CT angio revealed pulmonary embolism.   he was given 1 dose of lovenox. DR Juanetta GoslingHAWKINS was consulted and heparin drip was started  He remained stable with no c/o sob or chest pain he finished 5 days of heparin and was given the first dose of 10 mg of Eliquis before discharge  He had echo cardiogram which was done see report   Consults: pulmonology  Significant Diagnostic Studies: CT WITH ANGIO   Study Conclusions   - Left ventricle: The cavity size was normal. Wall thickness was   increased in a pattern of mild LVH. Systolic function was   vigorous. The estimated ejection fraction was in the range of 65%   to 70%. Wall motion was normal; there were no regional wall   motion abnormalities. Left ventricular diastolic function   parameters were normal for the patient&'s age. - Right ventricle: Systolic function was normal. - Right atrium: Central venous pressure (est): 3 mm Hg. - Tricuspid valve: There was trivial regurgitation. - Pulmonary arteries: Systolic pressure could not be accurately   estimated. - Pericardium, extracardiac: A prominent pericardial fat pad was   present.   Impressions:   - Mild LVH with LVEF 65-70%. Grossly normal diastolic function.   Normal right ventricular contraction. Trivial tricuspid   regurgitation.   Treatments: surgery: LEFT QUAD REPAIR  09/23/16  Discharge Exam: Blood pressure (!) 143/84, pulse 83, temperature 98.3 F (36.8 C), temperature source Oral, resp. rate 12, height 5\' 10"  (1.778 m), weight 256 lb 2.8 oz (116.2 kg), SpO2 96 %.   Disposition: 01-Home or Self Care  Discharge Instructions    Call MD / Call 911    Complete by:  As directed    If you experience chest pain or shortness of breath, CALL 911 and be transported to the hospital emergency room.  If you develope a fever above 101 F, pus (white drainage) or increased drainage or redness at the wound, or calf pain, call your surgeon's office.   Constipation Prevention    Complete by:  As directed    Drink plenty of fluids.  Prune juice may be helpful.  You may use a stool softener, such as Colace (over the counter) 100 mg twice a day.  Use MiraLax (over the counter) for constipation as needed.   Diet - low sodium heart healthy    Complete by:  As directed    Discharge instructions    Complete by:  As directed    WEIGHT BEARING AS TOLERATED WITH CRUTCHES   Increase activity slowly as tolerated    Complete by:  As directed      Discharge medications list warfarin which he will not be on. He will be on eliquis 10mg  bid x 7 days then 5 mg twice a day for 6 months. He will need hypercoagulable workup   Signed: Fuller CanadaStanley Harrison 09/27/2016, 8:01 AM

## 2016-09-27 NOTE — Progress Notes (Signed)
ANTICOAGULATION CONSULT NOTE -   Pharmacy Consult for Heparin Indication: pulmonary embolus  No Known Allergies  Patient Measurements: Height: 5\' 10"  (177.8 cm) Weight: 256 lb 2.8 oz (116.2 kg) IBW/kg (Calculated) : 73 HEPARIN DW (KG): 98.7  Vital Signs: Temp: 97.8 F (36.6 C) (07/30 0359) Temp Source: Oral (07/30 0359) BP: 143/84 (07/29 2000) Pulse Rate: 85 (07/29 2000)  Labs:  Recent Labs  09/24/16 1638  09/25/16 0649  09/25/16 1706 09/26/16 0526 09/26/16 1404 09/26/16 2205 09/27/16 0526  HGB  --   < > 14.7  --   --  14.4  --   --  13.8  HCT  --   --  44.1  --   --  43.3  --   --  42.1  PLT  --   --  187  --   --  204  --   --  247  LABPROT 14.3  --   --   --   --   --   --   --   --   INR 1.11  --   --   --   --   --   --   --   --   HEPARINUNFRC  --   --   --   < >  --  0.25* 0.27* 0.52 0.56  CREATININE  --   --   --   --  0.78  --   --   --   --   < > = values in this interval not displayed.  Estimated Creatinine Clearance: 128.6 mL/min (by C-G formula based on SCr of 0.78 mg/dL).  Medical History: Past Medical History:  Diagnosis Date  . Diabetes mellitus without complication (HCC)   . Hyperlipidemia   . Hypertension   . PE (pulmonary embolism)    Medications:  Prescriptions Prior to Admission  Medication Sig Dispense Refill Last Dose  . aspirin EC 81 MG EC tablet Take 1 tablet (81 mg total) by mouth daily.   09/22/2016 at Unknown time  . atorvastatin (LIPITOR) 20 MG tablet TAKE 1 TABLET EVERY EVENING 90 tablet 1 09/21/2016 at Unknown time  . benazepril (LOTENSIN) 20 MG tablet TAKE 1 TABLET (20 MG TOTAL) BY MOUTH DAILY, NEEDS TO BE SEEN 90 tablet 0 09/21/2016 at Unknown time  . diltiazem (CARDIZEM CD) 300 MG 24 hr capsule TAKE 1 CAPSULE (300 MG TOTAL) BY MOUTH DAILY. 90 capsule 1 09/22/2016 at Unknown time  . docusate sodium (COLACE) 100 MG capsule Take 200 mg by mouth 2 (two) times daily.   09/21/2016 at Unknown time  . metFORMIN (GLUCOPHAGE-XR) 500 MG 24  hr tablet TAKE 1 TABLET (500 MG TOTAL) BY MOUTH DAILY WITH BREAKFAST. 90 tablet 3 09/21/2016 at Unknown time  . Multiple Vitamins-Minerals (ONE-A-DAY MENS HEALTH FORMULA PO) Take 1 tablet by mouth daily.   09/21/2016 at Unknown time  . terbinafine (LAMISIL) 250 MG tablet Take 1 tablet (250 mg total) by mouth daily. 30 tablet 2 Past Week at Unknown time   Assessment: 56 yo male who had a tendon rupture of the left knee that was repaired 7/26. Patient started desaturation with ambulation, and diaphoresis with Physical Therapy. Patient has a history of clot with a previous surgery many years ago. CT angiogram shows bilateral pulmonary emboli. Pharmacy asked to dose heparin for patient.  HL  is therapeutic.  Goal of Therapy:  Heparin level 0.3-0.7 units/ml Monitor platelets by anticoagulation protocol: Yes   Plan:  Continue heparin  infusion at 2100 units/hr Check anti-Xa level daily while on heparin Continue to monitor H&H and platelets while on Heparin  Valrie HartScott Elby Blackwelder, PharmD Clinical Pharmacist Pager:  [phone] 09/27/2016   09/27/2016,7:54 AM

## 2016-09-27 NOTE — Progress Notes (Signed)
Patient assessed for pain throughout shift.  Ambulated with PT during shift.  Pt medicated for pain at 0830.  Has denied needing any pain medication since then.  Is stable at this time

## 2016-09-27 NOTE — Progress Notes (Signed)
Subjective: He says he feels better. He got up yesterday without any trouble. He still says he feels clammy at times. No other new complaints. No chest pain.  Objective: Vital signs in last 24 hours: Temp:  [97.8 F (36.6 C)-99.2 F (37.3 C)] 98.3 F (36.8 C) (07/30 0758) Pulse Rate:  [83-122] 83 (07/30 0758) Resp:  [11-27] 12 (07/30 0758) BP: (117-145)/(50-84) 143/84 (07/29 2000) SpO2:  [94 %-99 %] 96 % (07/30 0758) Weight:  [116.2 kg (256 lb 2.8 oz)] 116.2 kg (256 lb 2.8 oz) (07/30 0617) Weight change:  Last BM Date: 09/21/16  Intake/Output from previous day: 07/29 0701 - 07/30 0700 In: 1812.1 [P.O.:1400; I.V.:412.1] Out: -   PHYSICAL EXAM General appearance: alert, cooperative and no distress Resp: clear to auscultation bilaterally Cardio: regular rate and rhythm, S1, S2 normal, no murmur, click, rub or gallop GI: soft, non-tender; bowel sounds normal; no masses,  no organomegaly Extremities: extremities normal, atraumatic, no cyanosis or edema Skin warm and dry his left leg is still wrapped  Lab Results:  Results for orders placed or performed during the hospital encounter of 09/22/16 (from the past 48 hour(s))  Glucose, capillary     Status: Abnormal   Collection Time: 09/25/16  8:31 AM  Result Value Ref Range   Glucose-Capillary 132 (H) 65 - 99 mg/dL  Heparin level (unfractionated)     Status: None   Collection Time: 09/25/16 11:29 AM  Result Value Ref Range   Heparin Unfractionated 0.57 0.30 - 0.70 IU/mL    Comment:        IF HEPARIN RESULTS ARE BELOW EXPECTED VALUES, AND PATIENT DOSAGE HAS BEEN CONFIRMED, SUGGEST FOLLOW UP TESTING OF ANTITHROMBIN III LEVELS.   Glucose, capillary     Status: Abnormal   Collection Time: 09/25/16 11:38 AM  Result Value Ref Range   Glucose-Capillary 133 (H) 65 - 99 mg/dL  Basic metabolic panel     Status: Abnormal   Collection Time: 09/25/16  5:06 PM  Result Value Ref Range   Sodium 138 135 - 145 mmol/L   Potassium 3.9 3.5  - 5.1 mmol/L   Chloride 98 (L) 101 - 111 mmol/L   CO2 30 22 - 32 mmol/L   Glucose, Bld 118 (H) 65 - 99 mg/dL   BUN 13 6 - 20 mg/dL   Creatinine, Ser 0.78 0.61 - 1.24 mg/dL   Calcium 9.0 8.9 - 10.3 mg/dL   GFR calc non Af Amer >60 >60 mL/min   GFR calc Af Amer >60 >60 mL/min    Comment: (NOTE) The eGFR has been calculated using the CKD EPI equation. This calculation has not been validated in all clinical situations. eGFR's persistently <60 mL/min signify possible Chronic Kidney Disease.    Anion gap 10 5 - 15  Glucose, capillary     Status: Abnormal   Collection Time: 09/25/16  5:06 PM  Result Value Ref Range   Glucose-Capillary 122 (H) 65 - 99 mg/dL  Heparin level (unfractionated)     Status: Abnormal   Collection Time: 09/26/16  5:26 AM  Result Value Ref Range   Heparin Unfractionated 0.25 (L) 0.30 - 0.70 IU/mL    Comment:        IF HEPARIN RESULTS ARE BELOW EXPECTED VALUES, AND PATIENT DOSAGE HAS BEEN CONFIRMED, SUGGEST FOLLOW UP TESTING OF ANTITHROMBIN III LEVELS.   CBC with Differential/Platelet     Status: Abnormal   Collection Time: 09/26/16  5:26 AM  Result Value Ref Range   WBC 14.6 (  H) 4.0 - 10.5 K/uL   RBC 4.70 4.22 - 5.81 MIL/uL   Hemoglobin 14.4 13.0 - 17.0 g/dL   HCT 43.3 39.0 - 52.0 %   MCV 92.1 78.0 - 100.0 fL   MCH 30.6 26.0 - 34.0 pg   MCHC 33.3 30.0 - 36.0 g/dL   RDW 13.7 11.5 - 15.5 %   Platelets 204 150 - 400 K/uL   Neutrophils Relative % 66 %   Neutro Abs 9.5 (H) 1.7 - 7.7 K/uL   Lymphocytes Relative 18 %   Lymphs Abs 2.6 0.7 - 4.0 K/uL   Monocytes Relative 13 %   Monocytes Absolute 1.9 (H) 0.1 - 1.0 K/uL   Eosinophils Relative 3 %   Eosinophils Absolute 0.5 0.0 - 0.7 K/uL   Basophils Relative 0 %   Basophils Absolute 0.0 0.0 - 0.1 K/uL  Glucose, capillary     Status: Abnormal   Collection Time: 09/26/16  6:46 AM  Result Value Ref Range   Glucose-Capillary 114 (H) 65 - 99 mg/dL  Heparin level (unfractionated)     Status: Abnormal    Collection Time: 09/26/16  2:04 PM  Result Value Ref Range   Heparin Unfractionated 0.27 (L) 0.30 - 0.70 IU/mL    Comment:        IF HEPARIN RESULTS ARE BELOW EXPECTED VALUES, AND PATIENT DOSAGE HAS BEEN CONFIRMED, SUGGEST FOLLOW UP TESTING OF ANTITHROMBIN III LEVELS.   Glucose, capillary     Status: Abnormal   Collection Time: 09/26/16  8:49 PM  Result Value Ref Range   Glucose-Capillary 119 (H) 65 - 99 mg/dL  Heparin level (unfractionated)     Status: None   Collection Time: 09/26/16 10:05 PM  Result Value Ref Range   Heparin Unfractionated 0.52 0.30 - 0.70 IU/mL    Comment:        IF HEPARIN RESULTS ARE BELOW EXPECTED VALUES, AND PATIENT DOSAGE HAS BEEN CONFIRMED, SUGGEST FOLLOW UP TESTING OF ANTITHROMBIN III LEVELS.   Heparin level (unfractionated)     Status: None   Collection Time: 09/27/16  5:26 AM  Result Value Ref Range   Heparin Unfractionated 0.56 0.30 - 0.70 IU/mL    Comment:        IF HEPARIN RESULTS ARE BELOW EXPECTED VALUES, AND PATIENT DOSAGE HAS BEEN CONFIRMED, SUGGEST FOLLOW UP TESTING OF ANTITHROMBIN III LEVELS.   CBC with Differential/Platelet     Status: Abnormal   Collection Time: 09/27/16  5:26 AM  Result Value Ref Range   WBC 12.3 (H) 4.0 - 10.5 K/uL   RBC 4.56 4.22 - 5.81 MIL/uL   Hemoglobin 13.8 13.0 - 17.0 g/dL   HCT 42.1 39.0 - 52.0 %   MCV 92.3 78.0 - 100.0 fL   MCH 30.3 26.0 - 34.0 pg   MCHC 32.8 30.0 - 36.0 g/dL   RDW 13.7 11.5 - 15.5 %   Platelets 247 150 - 400 K/uL   Neutrophils Relative % 61 %   Neutro Abs 7.5 1.7 - 7.7 K/uL   Lymphocytes Relative 23 %   Lymphs Abs 2.8 0.7 - 4.0 K/uL   Monocytes Relative 12 %   Monocytes Absolute 1.5 (H) 0.1 - 1.0 K/uL   Eosinophils Relative 4 %   Eosinophils Absolute 0.5 0.0 - 0.7 K/uL   Basophils Relative 0 %   Basophils Absolute 0.0 0.0 - 0.1 K/uL    ABGS No results for input(s): PHART, PO2ART, TCO2, HCO3 in the last 72 hours.  Invalid input(s): PCO2 CULTURES  Recent Results (from  the past 240 hour(s))  Surgical pcr screen     Status: None   Collection Time: 09/22/16  8:15 PM  Result Value Ref Range Status   MRSA, PCR NEGATIVE NEGATIVE Final   Staphylococcus aureus NEGATIVE NEGATIVE Final    Comment:        The Xpert SA Assay (FDA approved for NASAL specimens in patients over 30 years of age), is one component of a comprehensive surveillance program.  Test performance has been validated by [facility] for patients greater than or equal to 61 year old. It is not intended to diagnose infection nor to guide or monitor treatment.    Studies/Results: No results found.  Medications:  Prior to Admission:  Prescriptions Prior to Admission  Medication Sig Dispense Refill Last Dose  . aspirin EC 81 MG EC tablet Take 1 tablet (81 mg total) by mouth daily.   09/22/2016 at Unknown time  . atorvastatin (LIPITOR) 20 MG tablet TAKE 1 TABLET EVERY EVENING 90 tablet 1 09/21/2016 at Unknown time  . benazepril (LOTENSIN) 20 MG tablet TAKE 1 TABLET (20 MG TOTAL) BY MOUTH DAILY, NEEDS TO BE SEEN 90 tablet 0 09/21/2016 at Unknown time  . diltiazem (CARDIZEM CD) 300 MG 24 hr capsule TAKE 1 CAPSULE (300 MG TOTAL) BY MOUTH DAILY. 90 capsule 1 09/22/2016 at Unknown time  . docusate sodium (COLACE) 100 MG capsule Take 200 mg by mouth 2 (two) times daily.   09/21/2016 at Unknown time  . metFORMIN (GLUCOPHAGE-XR) 500 MG 24 hr tablet TAKE 1 TABLET (500 MG TOTAL) BY MOUTH DAILY WITH BREAKFAST. 90 tablet 3 09/21/2016 at Unknown time  . Multiple Vitamins-Minerals (ONE-A-DAY MENS HEALTH FORMULA PO) Take 1 tablet by mouth daily.   09/21/2016 at Unknown time  . terbinafine (LAMISIL) 250 MG tablet Take 1 tablet (250 mg total) by mouth daily. 30 tablet 2 Past Week at Unknown time   Scheduled: . atorvastatin  20 mg Oral QPM  . benazepril  20 mg Oral Daily  . diltiazem  300 mg Oral Daily  . docusate sodium  200 mg Oral BID  . metFORMIN  500 mg Oral Q breakfast  . multivitamin with minerals   Oral  Daily  . terbinafine  250 mg Oral Daily   Continuous: . heparin 2,100 Units/hr (09/26/16 2054)   UFC:[id number] **OR** acetaminophen, HYDROcodone-acetaminophen, HYDROmorphone (DILAUDID) injection, oxyCODONE-acetaminophen, promethazine  Assesment: He was admitted with a knee injury and quadriceps muscle rupture and had surgery. The next morning he had pulmonary emboli. These were at least submassive. Echocardiogram done yesterday shows normal right ventricular function. Active Problems:   Knee injury   Quadriceps muscle rupture, left, initial encounter    Plan: Continue heparin for a total of 5 days and then start him on one of the novel anticoagulants    LOS: 1 day   Trea Latner L 09/27/2016, 8:00 AM

## 2016-09-27 NOTE — Progress Notes (Signed)
Pt requested B/P cuff to be removed for a while.  Removed, and noted skin clammy and sweaty.  Offered Wash cloth.  Pt stated relief.  Will replace to arm after his noon meal.

## 2016-09-28 DIAGNOSIS — I2699 Other pulmonary embolism without acute cor pulmonale: Secondary | ICD-10-CM | POA: Diagnosis not present

## 2016-09-28 LAB — CBC WITH DIFFERENTIAL/PLATELET
BASOS PCT: 0 %
Basophils Absolute: 0 10*3/uL (ref 0.0–0.1)
EOS ABS: 0.4 10*3/uL (ref 0.0–0.7)
Eosinophils Relative: 4 %
HCT: 41.4 % (ref 39.0–52.0)
HEMOGLOBIN: 13.9 g/dL (ref 13.0–17.0)
Lymphocytes Relative: 23 %
Lymphs Abs: 2.7 10*3/uL (ref 0.7–4.0)
MCH: 30.8 pg (ref 26.0–34.0)
MCHC: 33.6 g/dL (ref 30.0–36.0)
MCV: 91.8 fL (ref 78.0–100.0)
Monocytes Absolute: 1.3 10*3/uL — ABNORMAL HIGH (ref 0.1–1.0)
Monocytes Relative: 11 %
NEUTROS PCT: 62 %
Neutro Abs: 7.1 10*3/uL (ref 1.7–7.7)
Platelets: 232 10*3/uL (ref 150–400)
RBC: 4.51 MIL/uL (ref 4.22–5.81)
RDW: 13.5 % (ref 11.5–15.5)
WBC: 11.5 10*3/uL — AB (ref 4.0–10.5)

## 2016-09-28 LAB — GLUCOSE, CAPILLARY
GLUCOSE-CAPILLARY: 110 mg/dL — AB (ref 65–99)
GLUCOSE-CAPILLARY: 96 mg/dL (ref 65–99)

## 2016-09-28 LAB — HEPARIN LEVEL (UNFRACTIONATED): HEPARIN UNFRACTIONATED: 0.44 [IU]/mL (ref 0.30–0.70)

## 2016-09-28 NOTE — Care Management (Signed)
CM discussed HH PT with patient. [facility] Would like [facility] to let him no if there will be copays. Alroy BailiffLinda Lothian [facility] notified and will update patient.  Anticipate patient will need anticoagulation at DC, CM following.

## 2016-09-28 NOTE — Progress Notes (Signed)
Subjective: He says he feels okay. No complaints. His breathing is okay. He's been able to get up and move around. He is still on heparin but will finish 5 days tomorrow afternoon.  Objective: Vital signs in last 24 hours: Temp:  [98 F (36.7 C)-99 F (37.2 C)] 98.6 F (37 C) (07/31 0744) Pulse Rate:  [74-136] 83 (07/31 0800) Resp:  [11-19] 19 (07/31 0700) BP: (123-131)/(70-86) 130/85 (07/31 0800) SpO2:  [90 %-98 %] 93 % (07/31 0800) Weight:  [114.9 kg (253 lb 4.9 oz)] 114.9 kg (253 lb 4.9 oz) (07/31 0500) Weight change: -1.3 kg (-2 lb 13.9 oz) Last BM Date: 09/21/16  Intake/Output from previous day: 07/30 0701 - 07/31 0700 In: 240 [P.O.:240] Out: 2000 [Urine:2000]  PHYSICAL EXAM General appearance: alert, cooperative and no distress Resp: clear to auscultation bilaterally Cardio: regular rate and rhythm, S1, S2 normal, no murmur, click, rub or gallop GI: soft, non-tender; bowel sounds normal; no masses,  no organomegaly Extremities: extremities normal, atraumatic, no cyanosis or edema Skin warm and dry.  Lab Results:  Results for orders placed or performed during the hospital encounter of 09/22/16 (from the past 48 hour(s))  Heparin level (unfractionated)     Status: Abnormal   Collection Time: 09/26/16  2:04 PM  Result Value Ref Range   Heparin Unfractionated 0.27 (L) 0.30 - 0.70 IU/mL    Comment:        IF HEPARIN RESULTS ARE BELOW EXPECTED VALUES, AND PATIENT DOSAGE HAS BEEN CONFIRMED, SUGGEST FOLLOW UP TESTING OF ANTITHROMBIN III LEVELS.   Glucose, capillary     Status: Abnormal   Collection Time: 09/26/16  8:49 PM  Result Value Ref Range   Glucose-Capillary 119 (H) 65 - 99 mg/dL  Heparin level (unfractionated)     Status: None   Collection Time: 09/26/16 10:05 PM  Result Value Ref Range   Heparin Unfractionated 0.52 0.30 - 0.70 IU/mL    Comment:        IF HEPARIN RESULTS ARE BELOW EXPECTED VALUES, AND PATIENT DOSAGE HAS BEEN CONFIRMED, SUGGEST FOLLOW UP  TESTING OF ANTITHROMBIN III LEVELS.   Heparin level (unfractionated)     Status: None   Collection Time: 09/27/16  5:26 AM  Result Value Ref Range   Heparin Unfractionated 0.56 0.30 - 0.70 IU/mL    Comment:        IF HEPARIN RESULTS ARE BELOW EXPECTED VALUES, AND PATIENT DOSAGE HAS BEEN CONFIRMED, SUGGEST FOLLOW UP TESTING OF ANTITHROMBIN III LEVELS.   CBC with Differential/Platelet     Status: Abnormal   Collection Time: 09/27/16  5:26 AM  Result Value Ref Range   WBC 12.3 (H) 4.0 - 10.5 K/uL   RBC 4.56 4.22 - 5.81 MIL/uL   Hemoglobin 13.8 13.0 - 17.0 g/dL   HCT 40.942.1 81.139.0 - 91.452.0 %   MCV 92.3 78.0 - 100.0 fL   MCH 30.3 26.0 - 34.0 pg   MCHC 32.8 30.0 - 36.0 g/dL   RDW 78.213.7 95.611.5 - 21.315.5 %   Platelets 247 150 - 400 K/uL   Neutrophils Relative % 61 %   Neutro Abs 7.5 1.7 - 7.7 K/uL   Lymphocytes Relative 23 %   Lymphs Abs 2.8 0.7 - 4.0 K/uL   Monocytes Relative 12 %   Monocytes Absolute 1.5 (H) 0.1 - 1.0 K/uL   Eosinophils Relative 4 %   Eosinophils Absolute 0.5 0.0 - 0.7 K/uL   Basophils Relative 0 %   Basophils Absolute 0.0 0.0 - 0.1 K/uL  Glucose, capillary     Status: Abnormal   Collection Time: 09/27/16  7:57 AM  Result Value Ref Range   Glucose-Capillary 112 (H) 65 - 99 mg/dL  Glucose, capillary     Status: Abnormal   Collection Time: 09/27/16 11:34 AM  Result Value Ref Range   Glucose-Capillary 115 (H) 65 - 99 mg/dL  Glucose, capillary     Status: Abnormal   Collection Time: 09/27/16  4:38 PM  Result Value Ref Range   Glucose-Capillary 114 (H) 65 - 99 mg/dL  Heparin level (unfractionated)     Status: None   Collection Time: 09/28/16  4:49 AM  Result Value Ref Range   Heparin Unfractionated 0.44 0.30 - 0.70 IU/mL    Comment:        IF HEPARIN RESULTS ARE BELOW EXPECTED VALUES, AND PATIENT DOSAGE HAS BEEN CONFIRMED, SUGGEST FOLLOW UP TESTING OF ANTITHROMBIN III LEVELS.   CBC with Differential/Platelet     Status: Abnormal   Collection Time: 09/28/16  4:49  AM  Result Value Ref Range   WBC 11.5 (H) 4.0 - 10.5 K/uL   RBC 4.51 4.22 - 5.81 MIL/uL   Hemoglobin 13.9 13.0 - 17.0 g/dL   HCT 40.9 81.1 - 91.4 %   MCV 91.8 78.0 - 100.0 fL   MCH 30.8 26.0 - 34.0 pg   MCHC 33.6 30.0 - 36.0 g/dL   RDW 78.2 95.6 - 21.3 %   Platelets 232 150 - 400 K/uL   Neutrophils Relative % 62 %   Neutro Abs 7.1 1.7 - 7.7 K/uL   Lymphocytes Relative 23 %   Lymphs Abs 2.7 0.7 - 4.0 K/uL   Monocytes Relative 11 %   Monocytes Absolute 1.3 (H) 0.1 - 1.0 K/uL   Eosinophils Relative 4 %   Eosinophils Absolute 0.4 0.0 - 0.7 K/uL   Basophils Relative 0 %   Basophils Absolute 0.0 0.0 - 0.1 K/uL  Glucose, capillary     Status: Abnormal   Collection Time: 09/28/16  7:43 AM  Result Value Ref Range   Glucose-Capillary 110 (H) 65 - 99 mg/dL    ABGS No results for input(s): PHART, PO2ART, TCO2, HCO3 in the last 72 hours.  Invalid input(s): PCO2 CULTURES Recent Results (from the past 240 hour(s))  Surgical pcr screen     Status: None   Collection Time: 09/22/16  8:15 PM  Result Value Ref Range Status   MRSA, PCR NEGATIVE NEGATIVE Final   Staphylococcus aureus NEGATIVE NEGATIVE Final    Comment:        The Xpert SA Assay (FDA approved for NASAL specimens in patients over 92 years of age), is one component of a comprehensive surveillance program.  Test performance has been validated by [facility] for patients greater than or equal to 23 year old. It is not intended to diagnose infection nor to guide or monitor treatment.    Studies/Results: No results found.  Medications:  Prior to Admission:  Prescriptions Prior to Admission  Medication Sig Dispense Refill Last Dose  . aspirin EC 81 MG EC tablet Take 1 tablet (81 mg total) by mouth daily.   09/22/2016 at Unknown time  . atorvastatin (LIPITOR) 20 MG tablet TAKE 1 TABLET EVERY EVENING 90 tablet 1 09/21/2016 at Unknown time  . benazepril (LOTENSIN) 20 MG tablet TAKE 1 TABLET (20 MG TOTAL) BY MOUTH DAILY,  NEEDS TO BE SEEN 90 tablet 0 09/21/2016 at Unknown time  . diltiazem (CARDIZEM CD) 300 MG 24 hr capsule  TAKE 1 CAPSULE (300 MG TOTAL) BY MOUTH DAILY. 90 capsule 1 09/22/2016 at Unknown time  . docusate sodium (COLACE) 100 MG capsule Take 200 mg by mouth 2 (two) times daily.   09/21/2016 at Unknown time  . metFORMIN (GLUCOPHAGE-XR) 500 MG 24 hr tablet TAKE 1 TABLET (500 MG TOTAL) BY MOUTH DAILY WITH BREAKFAST. 90 tablet 3 09/21/2016 at Unknown time  . Multiple Vitamins-Minerals (ONE-A-DAY MENS HEALTH FORMULA PO) Take 1 tablet by mouth daily.   09/21/2016 at Unknown time  . terbinafine (LAMISIL) 250 MG tablet Take 1 tablet (250 mg total) by mouth daily. 30 tablet 2 Past Week at Unknown time   Scheduled: . atorvastatin  20 mg Oral QPM  . benazepril  20 mg Oral Daily  . diltiazem  300 mg Oral Daily  . docusate sodium  200 mg Oral BID  . metFORMIN  500 mg Oral Q breakfast  . multivitamin with minerals   Oral Daily  . terbinafine  250 mg Oral Daily   Continuous: . heparin 2,100 Units/hr (09/27/16 1926)   HQI:[id number]:acetaminophen **OR** acetaminophen, HYDROcodone-acetaminophen, HYDROmorphone (DILAUDID) injection, oxyCODONE-acetaminophen, promethazine  Assesment: He was admitted with a knee injury and a quadriceps muscle rupture and required surgery for that. When he got up to start physical therapy the next day he had pulmonary emboli. These were at least submassive and he's been on heparin. Active Problems:   Knee injury   Quadriceps muscle rupture, left, initial encounter    Plan: Continue treatment with heparin. Continue physical therapy. Probable discharge tomorrow    LOS: 1 day   Brian Roach L 09/28/2016, 8:12 AM

## 2016-09-28 NOTE — Progress Notes (Signed)
Physical Therapy Treatment Patient Details Name: Brian Roach MRN: [id number] DOB: 02-14-1961 Today's Date: 09/28/2016    History of Present Illness Stacey Matejka is a 56yo white male who was walking his dog in flip flops and fell onto a flexed knee, ultimately arriving at the ED [facility] on 7/25. Ortho consult reports full rupture of the quadriceps tendon from the patella and some adjacent soft tissue injury. Pt presents on 7/27, POD1 s/p patellar tendon repair. PMH: DM, PE, HTN, HLD, Rt ACL surgery (~15ya c subsequent PE), back surgery.  Pt was found to have 2 PE on POD1 shortly after PT eval and subsequently moved to step-down with 2D bedrest and therapeutic anticoagulation. Pt treatment resumed on 7/29.     PT Comments    Pt semirecumbent in bed, friendly and willing to participate.  Reviewed mechanics for bed mobility and instructed mechanics for pain control with standing to sit.  Good gait mechanics noted, did require cueing for posture and to reduce pressure on UE for back pain control, supervision only with cueing.  Pt stated he has one step to get into home, pt educated mechanics for confidence getting into home as DC plans for tomorrow.  Pt able to demonstrate and verbalize proper mechanics.  EOS pt left in bed with call bell within reach.  No reports of increased pain through session.     Follow Up Recommendations  Home health PT     Equipment Recommendations  Rolling walker with 5" wheels;3in1 (PT)    Recommendations for Other Services       Precautions / Restrictions Precautions Precautions: Fall Required Braces or Orthoses: Knee Immobilizer - Left Knee Immobilizer - Left: On at all times Restrictions Weight Bearing Restrictions: Yes LLE Weight Bearing: Weight bearing as tolerated    Mobility  Bed Mobility Overal bed mobility: Modified Independent Bed Mobility: Supine to Sit;Sit to Supine     Supine to sit: Supervision (increased time only, able to complete good  mechanics independenlty with minimal cueing) Sit to supine: Supervision   General bed mobility comments: VC for technique and safety; successful leg hook for rotation into bed, demonstrates independently  Transfers Overall transfer level: Modified independent Equipment used: Rolling walker (2 wheeled) Transfers: Sit to/from Stand Sit to Stand: From elevated surface;Supervision (Cueing for hand placement to assist)         General transfer comment: Cueing for hand placement for safety and ease with mechanics  Ambulation/Gait Ambulation/Gait assistance: Supervision Ambulation Distance (Feet): 200 Feet (100 feet x 2 reps) Assistive device: Rolling walker (2 wheeled)       General Gait Details: VC to correct abducted LLE, posture and reduce weight bearing on UE; WBAT on LLE, KI donned adequately tight per patient    Stairs Stairs: Yes   Stair Management: With walker;One rail Left Number of Stairs: 1 General stair comments: Cueing for return home; educated on proper mechanics (good up and bad down) with walker assistance due to no handrails available  Wheelchair Mobility    Modified Rankin (Stroke Patients Only)       Balance                                            Cognition Arousal/Alertness: Awake/alert Behavior During Therapy: WFL for tasks assessed/performed Overall Cognitive Status: Within Functional Limits for tasks assessed  Exercises      General Comments        Pertinent Vitals/Pain Pain Assessment: No/denies pain Pain Location: Left knee/quads; pt reports knee felt good through session Pain Intervention(s): Monitored during session;Repositioned (Pt educated on techniques for pain control with sitting mechanics)    Home Living                      Prior Function            PT Goals (current goals can now be found in the care plan section) Acute Rehab PT  Goals Patient Stated Goal: return to home PT Goal Formulation: With patient Time For Goal Achievement: 10/08/16 Potential to Achieve Goals: Good Progress towards PT goals: Progressing toward goals    Frequency    7X/week      PT Plan Current plan remains appropriate    Co-evaluation              AM-PAC PT "6 Clicks" Daily Activity  Outcome Measure  Difficulty turning over in bed (including adjusting bedclothes, sheets and blankets)?: A Little Difficulty moving from lying on back to sitting on the side of the bed? : A Lot Difficulty sitting down on and standing up from a chair with arms (e.g., wheelchair, bedside commode, etc,.)?: A Lot Help needed moving to and from a bed to chair (including a wheelchair)?: A Lot Help needed walking in hospital room?: A Lot Help needed climbing 3-5 steps with a railing? : A Lot 6 Click Score: 13    End of Session Equipment Utilized During Treatment: Gait belt;Left knee immobilizer Activity Tolerance: Patient tolerated treatment well Patient left: in bed;with call bell/phone within reach   PT Visit Diagnosis: Muscle weakness (generalized) (M62.81);Difficulty in walking, not elsewhere classified (R26.2);Unsteadiness on feet (R26.81)     Time: 1538-1610 PT Time Calculation (min) (ACUTE ONLY): 32 min  Charges:  $Gait Training: 8-22 mins $Therapeutic Activity: 8-22 mins                    G Codes:      Becky Sax, LPTA; CBIS [phone]   Juel Burrow 09/28/2016, 4:37 PM

## 2016-09-28 NOTE — Progress Notes (Signed)
ANTICOAGULATION CONSULT NOTE -   Pharmacy Consult for Heparin Indication: pulmonary embolus  No Known Allergies  Patient Measurements: Height: 5\' 10"  (177.8 cm) Weight: 253 lb 4.9 oz (114.9 kg) IBW/kg (Calculated) : 73 HEPARIN DW (KG): 98.7  Vital Signs: Temp: 98.1 F (36.7 C) (07/31 0400) Temp Source: Oral (07/31 0400) BP: 131/86 (07/31 0000) Pulse Rate: 78 (07/31 0300)  Labs:  Recent Labs  09/25/16 1706  09/26/16 0526  09/26/16 2205 09/27/16 0526 09/28/16 0449  HGB  --   < > 14.4  --   --  13.8 13.9  HCT  --   --  43.3  --   --  42.1 41.4  PLT  --   --  204  --   --  247 232  HEPARINUNFRC  --   --  0.25*  < > 0.52 0.56 0.44  CREATININE 0.78  --   --   --   --   --   --   < > = values in this interval not displayed.  Estimated Creatinine Clearance: 127.8 mL/min (by C-G formula based on SCr of 0.78 mg/dL).  Medical History: Past Medical History:  Diagnosis Date  . Diabetes mellitus without complication (HCC)   . Hyperlipidemia   . Hypertension   . PE (pulmonary embolism)    Medications:  Prescriptions Prior to Admission  Medication Sig Dispense Refill Last Dose  . aspirin EC 81 MG EC tablet Take 1 tablet (81 mg total) by mouth daily.   09/22/2016 at Unknown time  . atorvastatin (LIPITOR) 20 MG tablet TAKE 1 TABLET EVERY EVENING 90 tablet 1 09/21/2016 at Unknown time  . benazepril (LOTENSIN) 20 MG tablet TAKE 1 TABLET (20 MG TOTAL) BY MOUTH DAILY, NEEDS TO BE SEEN 90 tablet 0 09/21/2016 at Unknown time  . diltiazem (CARDIZEM CD) 300 MG 24 hr capsule TAKE 1 CAPSULE (300 MG TOTAL) BY MOUTH DAILY. 90 capsule 1 09/22/2016 at Unknown time  . docusate sodium (COLACE) 100 MG capsule Take 200 mg by mouth 2 (two) times daily.   09/21/2016 at Unknown time  . metFORMIN (GLUCOPHAGE-XR) 500 MG 24 hr tablet TAKE 1 TABLET (500 MG TOTAL) BY MOUTH DAILY WITH BREAKFAST. 90 tablet 3 09/21/2016 at Unknown time  . Multiple Vitamins-Minerals (ONE-A-DAY MENS HEALTH FORMULA PO) Take 1 tablet  by mouth daily.   09/21/2016 at Unknown time  . terbinafine (LAMISIL) 250 MG tablet Take 1 tablet (250 mg total) by mouth daily. 30 tablet 2 Past Week at Unknown time   Assessment: 56 yo male who had a tendon rupture of the left knee that was repaired 7/26. Patient started desaturation with ambulation, and diaphoresis with Physical Therapy. Patient has a history of clot with a previous surgery many years ago. CT angiogram shows bilateral pulmonary emboli. Pharmacy asked to dose heparin for patient.  HL  is therapeutic.  Goal of Therapy:  Heparin level 0.3-0.7 units/ml Monitor platelets by anticoagulation protocol: Yes   Plan:  Continue heparin infusion at 2100 units/hr Check anti-Xa level daily while on heparin Continue to monitor H&H and platelets while on Heparin  Valrie HartScott Johneric Mcfadden, PharmD Clinical Pharmacist Pager:  [phone] 09/28/2016   09/28/2016,7:39 AM

## 2016-09-29 LAB — CBC
HCT: 42 % (ref 39.0–52.0)
Hemoglobin: 14.2 g/dL (ref 13.0–17.0)
MCH: 30.7 pg (ref 26.0–34.0)
MCHC: 33.8 g/dL (ref 30.0–36.0)
MCV: 90.9 fL (ref 78.0–100.0)
PLATELETS: 244 10*3/uL (ref 150–400)
RBC: 4.62 MIL/uL (ref 4.22–5.81)
RDW: 13.5 % (ref 11.5–15.5)
WBC: 11.7 10*3/uL — AB (ref 4.0–10.5)

## 2016-09-29 LAB — GLUCOSE, CAPILLARY: Glucose-Capillary: 113 mg/dL — ABNORMAL HIGH (ref 65–99)

## 2016-09-29 LAB — HEPARIN LEVEL (UNFRACTIONATED): HEPARIN UNFRACTIONATED: 0.46 [IU]/mL (ref 0.30–0.70)

## 2016-09-29 MED ORDER — APIXABAN 5 MG PO TABS
10.0000 mg | ORAL_TABLET | Freq: Two times a day (BID) | ORAL | Status: DC
  Administered 2016-09-29: 10 mg via ORAL
  Filled 2016-09-29: qty 2

## 2016-09-29 MED ORDER — APIXABAN 5 MG PO TABS: 10.0000 mg | ORAL_TABLET | Freq: Two times a day (BID) | ORAL | 5 refills | Status: DC

## 2016-09-29 MED ORDER — APIXABAN 5 MG PO TABS: 5.0000 mg | ORAL_TABLET | Freq: Two times a day (BID) | ORAL | 5 refills | Status: DC

## 2016-09-29 MED ORDER — APIXABAN 5 MG PO TABS: 5.0000 mg | ORAL_TABLET | Freq: Two times a day (BID) | ORAL | Status: DC

## 2016-09-29 MED ORDER — APIXABAN 5 MG PO TABS: 10.0000 mg | ORAL_TABLET | Freq: Two times a day (BID) | ORAL | Status: DC

## 2016-09-29 NOTE — Progress Notes (Signed)
Subjective: He feels well. No complaints. He has some pain in his leg which is postop. No shortness of breath. No chest pain. No hemoptysis. No bleeding.  Objective: Vital signs in last 24 hours: Temp:  [97.5 F (36.4 C)-98.4 F (36.9 C)] 98.2 F (36.8 C) (08/01 0208) Pulse Rate:  [77-98] 80 (08/01 0208) Resp:  [14-20] 18 (07/31 1800) BP: (120)/(92) 120/92 (08/01 0208) SpO2:  [93 %-99 %] 99 % (08/01 0208) Weight change:  Last BM Date: 09/21/16  Intake/Output from previous day: 07/31 0701 - 08/01 0700 In: 1683 [P.O.:1200; I.V.:483] Out: 2050 [Urine:2050]  PHYSICAL EXAM General appearance: alert, cooperative and no distress Resp: clear to auscultation bilaterally Cardio: regular rate and rhythm, S1, S2 normal, no murmur, click, rub or gallop GI: soft, non-tender; bowel sounds normal; no masses,  no organomegaly Extremities: extremities normal, atraumatic, no cyanosis or edema Skin warm and dry  Lab Results:  Results for orders placed or performed during the hospital encounter of 09/22/16 (from the past 48 hour(s))  Glucose, capillary     Status: Abnormal   Collection Time: 09/27/16 11:34 AM  Result Value Ref Range   Glucose-Capillary 115 (H) 65 - 99 mg/dL  Glucose, capillary     Status: Abnormal   Collection Time: 09/27/16  4:38 PM  Result Value Ref Range   Glucose-Capillary 114 (H) 65 - 99 mg/dL  Heparin level (unfractionated)     Status: None   Collection Time: 09/28/16  4:49 AM  Result Value Ref Range   Heparin Unfractionated 0.44 0.30 - 0.70 IU/mL    Comment:        IF HEPARIN RESULTS ARE BELOW EXPECTED VALUES, AND PATIENT DOSAGE HAS BEEN CONFIRMED, SUGGEST FOLLOW UP TESTING OF ANTITHROMBIN III LEVELS.   CBC with Differential/Platelet     Status: Abnormal   Collection Time: 09/28/16  4:49 AM  Result Value Ref Range   WBC 11.5 (H) 4.0 - 10.5 K/uL   RBC 4.51 4.22 - 5.81 MIL/uL   Hemoglobin 13.9 13.0 - 17.0 g/dL   HCT 40.941.4 81.139.0 - 91.452.0 %   MCV 91.8 78.0 - 100.0  fL   MCH 30.8 26.0 - 34.0 pg   MCHC 33.6 30.0 - 36.0 g/dL   RDW 78.213.5 95.611.5 - 21.315.5 %   Platelets 232 150 - 400 K/uL   Neutrophils Relative % 62 %   Neutro Abs 7.1 1.7 - 7.7 K/uL   Lymphocytes Relative 23 %   Lymphs Abs 2.7 0.7 - 4.0 K/uL   Monocytes Relative 11 %   Monocytes Absolute 1.3 (H) 0.1 - 1.0 K/uL   Eosinophils Relative 4 %   Eosinophils Absolute 0.4 0.0 - 0.7 K/uL   Basophils Relative 0 %   Basophils Absolute 0.0 0.0 - 0.1 K/uL  Glucose, capillary     Status: Abnormal   Collection Time: 09/28/16  7:43 AM  Result Value Ref Range   Glucose-Capillary 110 (H) 65 - 99 mg/dL  Glucose, capillary     Status: None   Collection Time: 09/28/16  9:04 PM  Result Value Ref Range   Glucose-Capillary 96 65 - 99 mg/dL   Comment 1 Notify RN    Comment 2 Document in Chart   CBC     Status: Abnormal   Collection Time: 09/29/16  5:43 AM  Result Value Ref Range   WBC 11.7 (H) 4.0 - 10.5 K/uL   RBC 4.62 4.22 - 5.81 MIL/uL   Hemoglobin 14.2 13.0 - 17.0 g/dL   HCT 42.0  39.0 - 52.0 %   MCV 90.9 78.0 - 100.0 fL   MCH 30.7 26.0 - 34.0 pg   MCHC 33.8 30.0 - 36.0 g/dL   RDW 21.3 08.6 - 57.8 %   Platelets 244 150 - 400 K/uL  Heparin level (unfractionated)     Status: None   Collection Time: 09/29/16  5:46 AM  Result Value Ref Range   Heparin Unfractionated 0.46 0.30 - 0.70 IU/mL    Comment:        IF HEPARIN RESULTS ARE BELOW EXPECTED VALUES, AND PATIENT DOSAGE HAS BEEN CONFIRMED, SUGGEST FOLLOW UP TESTING OF ANTITHROMBIN III LEVELS.   Glucose, capillary     Status: Abnormal   Collection Time: 09/29/16  8:18 AM  Result Value Ref Range   Glucose-Capillary 113 (H) 65 - 99 mg/dL   Comment 1 Notify RN    Comment 2 Document in Chart     ABGS No results for input(s): PHART, PO2ART, TCO2, HCO3 in the last 72 hours.  Invalid input(s): PCO2 CULTURES Recent Results (from the past 240 hour(s))  Surgical pcr screen     Status: None   Collection Time: 09/22/16  8:15 PM  Result Value Ref  Range Status   MRSA, PCR NEGATIVE NEGATIVE Final   Staphylococcus aureus NEGATIVE NEGATIVE Final    Comment:        The Xpert SA Assay (FDA approved for NASAL specimens in patients over 8 years of age), is one component of a comprehensive surveillance program.  Test performance has been validated by [facility] for patients greater than or equal to 60 year old. It is not intended to diagnose infection nor to guide or monitor treatment.    Studies/Results: No results found.  Medications:  Prior to Admission:  Prescriptions Prior to Admission  Medication Sig Dispense Refill Last Dose  . aspirin EC 81 MG EC tablet Take 1 tablet (81 mg total) by mouth daily.   09/22/2016 at Unknown time  . atorvastatin (LIPITOR) 20 MG tablet TAKE 1 TABLET EVERY EVENING 90 tablet 1 09/21/2016 at Unknown time  . benazepril (LOTENSIN) 20 MG tablet TAKE 1 TABLET (20 MG TOTAL) BY MOUTH DAILY, NEEDS TO BE SEEN 90 tablet 0 09/21/2016 at Unknown time  . diltiazem (CARDIZEM CD) 300 MG 24 hr capsule TAKE 1 CAPSULE (300 MG TOTAL) BY MOUTH DAILY. 90 capsule 1 09/22/2016 at Unknown time  . docusate sodium (COLACE) 100 MG capsule Take 200 mg by mouth 2 (two) times daily.   09/21/2016 at Unknown time  . metFORMIN (GLUCOPHAGE-XR) 500 MG 24 hr tablet TAKE 1 TABLET (500 MG TOTAL) BY MOUTH DAILY WITH BREAKFAST. 90 tablet 3 09/21/2016 at Unknown time  . Multiple Vitamins-Minerals (ONE-A-DAY MENS HEALTH FORMULA PO) Take 1 tablet by mouth daily.   09/21/2016 at Unknown time  . terbinafine (LAMISIL) 250 MG tablet Take 1 tablet (250 mg total) by mouth daily. 30 tablet 2 Past Week at Unknown time   Scheduled: . apixaban  10 mg Oral BID   Followed by  . [START ON 10/06/2016] apixaban  5 mg Oral BID  . atorvastatin  20 mg Oral QPM  . benazepril  20 mg Oral Daily  . diltiazem  300 mg Oral Daily  . docusate sodium  200 mg Oral BID  . metFORMIN  500 mg Oral Q breakfast  . multivitamin with minerals   Oral Daily  . terbinafine  250  mg Oral Daily   Continuous: . heparin 2,100 Units/hr (09/28/16 2329)  ZOX:[id number]:acetaminophen **OR** acetaminophen, HYDROmorphone (DILAUDID) injection, oxyCODONE-acetaminophen, promethazine  Assesment: He was admitted with a quadriceps muscle rupture on the left. He had a knee injury as well. He underwent operative treatment and when he got up the next morning he had acute pulmonary embolism. He had borderline right ventricular strain and was treated as a submassive pulmonary embolus with IV heparin. He will finish 5 days today. He will be started on Eliquis. He can be discharged later today. Additionally he has diabetes which is well controlled hypertension which is doing well and hyperlipidemia which was not rechecked. He has done well postop. Active Problems:   HTN (hypertension), benign   Hypercholesteremia   Diabetes mellitus without complication (HCC)   Knee injury   Quadriceps muscle rupture, left, initial encounter   Acute pulmonary embolism (HCC)    Plan: Continue current treatments discontinue heparin later today. First dose of the novel anticoagulant today and then discharged    LOS: 3 days   Xin Klawitter L 09/29/2016, 8:25 AM

## 2016-09-29 NOTE — Progress Notes (Addendum)
ANTICOAGULATION CONSULT NOTE -   Pharmacy Consult for Heparin--> Eliquis Indication: pulmonary embolus  No Known Allergies  Patient Measurements: Height: 5\' 10"  (177.8 cm) Weight: 253 lb 4.9 oz (114.9 kg) IBW/kg (Calculated) : 73 HEPARIN DW (KG): 98.7  Vital Signs: Temp: 98.2 F (36.8 C) (08/01 0208) Temp Source: Oral (08/01 0208) BP: 120/92 (08/01 0208) Pulse Rate: 80 (08/01 0208)  Labs:  Recent Labs  09/27/16 0526 09/28/16 0449 09/29/16 0543 09/29/16 0546  HGB 13.8 13.9 14.2  --   HCT 42.1 41.4 42.0  --   PLT 247 232 244  --   HEPARINUNFRC 0.56 0.44  --  0.46    Estimated Creatinine Clearance: 127.8 mL/min (by C-G formula based on SCr of 0.78 mg/dL).  Medical History: Past Medical History:  Diagnosis Date  . Diabetes mellitus without complication (HCC)   . Hyperlipidemia   . Hypertension   . PE (pulmonary embolism)    Medications:  Prescriptions Prior to Admission  Medication Sig Dispense Refill Last Dose  . aspirin EC 81 MG EC tablet Take 1 tablet (81 mg total) by mouth daily.   09/22/2016 at Unknown time  . atorvastatin (LIPITOR) 20 MG tablet TAKE 1 TABLET EVERY EVENING 90 tablet 1 09/21/2016 at Unknown time  . benazepril (LOTENSIN) 20 MG tablet TAKE 1 TABLET (20 MG TOTAL) BY MOUTH DAILY, NEEDS TO BE SEEN 90 tablet 0 09/21/2016 at Unknown time  . diltiazem (CARDIZEM CD) 300 MG 24 hr capsule TAKE 1 CAPSULE (300 MG TOTAL) BY MOUTH DAILY. 90 capsule 1 09/22/2016 at Unknown time  . docusate sodium (COLACE) 100 MG capsule Take 200 mg by mouth 2 (two) times daily.   09/21/2016 at Unknown time  . metFORMIN (GLUCOPHAGE-XR) 500 MG 24 hr tablet TAKE 1 TABLET (500 MG TOTAL) BY MOUTH DAILY WITH BREAKFAST. 90 tablet 3 09/21/2016 at Unknown time  . Multiple Vitamins-Minerals (ONE-A-DAY MENS HEALTH FORMULA PO) Take 1 tablet by mouth daily.   09/21/2016 at Unknown time  . terbinafine (LAMISIL) 250 MG tablet Take 1 tablet (250 mg total) by mouth daily. 30 tablet 2 Past Week at  Unknown time   Assessment: 56 yo male who had a tendon rupture of the left knee that was repaired 7/26. Patient started desaturation with ambulation, and diaphoresis with Physical Therapy. Patient has a history of clot with a previous surgery many years ago. CT angiogram shows bilateral pulmonary emboli. Pharmacy asked to dose heparin for patient.  HL  is therapeutic. D/W Dr. Juanetta GoslingHawkins and will transition  patient to oral tx with Eliquis.  Goal of Therapy:  Heparin level 0.3-0.7 units/ml Monitor platelets by anticoagulation protocol: Yes   Plan:  D/C Heparin Eliquis 10mg  po BID for 7 days, then 5mg  po BID Eliquis education Continue to monitor H&H and platelets while on Heparin  Elder CyphersLorie Orilla Templeman, BS Loura BackPharm D, BCPS Clinical Pharmacist Pager [phone]  09/29/2016,8:15 AM

## 2016-09-29 NOTE — Progress Notes (Signed)
Pt discharged home today per Hawkins. Pt's IV site D/C'd and WDL. Pt's VSS. Pt provided with home medication list, discharge instructions and prescriptions. Verbalized understanding. Pt left floor via WC in stable condition accompanied by NT.

## 2016-09-29 NOTE — Progress Notes (Signed)
PT Cancellation Note  Patient Details Name: Brian Roach MRN: [id number] DOB: 25-May-1960   Cancelled Treatment:    Reason Eval/Treat Not Completed: Other (comment). Chart reviewed, RN consulted. Pt has met all mobility goals sufficicent for DC to home. 1DA pt amb 2x161f and perform stairs training with RW. Pt reports he is leaving in ~30 minutes for DC. I ask if he feels confident and safe with mobility needs to access home: he reports that he does. I ask if he wants to review his amb, bed mobility, transfers, or stairs prior to leaving: he declines at this time, reporting he will save his energy for the journey home.   2:25 PM, 09/29/16 AEtta Grandchild PT, DPT Physical Therapist - [phone](Office)    Brian Roach C 09/29/2016, 2:23 PM

## 2016-09-29 NOTE — Care Management Note (Signed)
Case Management Note  Patient Details  Name: Brian Roach MRN: [id number] Date of Birth: 11/11/1960     Expected Discharge Date:  09/29/16               Expected Discharge Plan:  Home w Home Health Services  In-House Referral:  NA  Discharge planning Services  CM Consult  Post Acute Care Choice:  Home Health Choice offered to:  Patient  DME Arranged:    DME Agency:     HH Arranged:  PT HH Agency:  [facility]  Status of Service:  Completed, signed off  If discussed at Long Length of Stay Meetings, dates discussed:    Additional Comments: Patient discharging home today. [facility] $10 copay coupon voucher given for Eliquis. Patient has RW at home PTA. [facility]. No other CM needs.   Johaan Ryser, Chrystine OilerSharley Diane, RN 09/29/2016, 11:31 AM

## 2016-10-04 ENCOUNTER — Ambulatory Visit (INDEPENDENT_AMBULATORY_CARE_PROVIDER_SITE_OTHER): Payer: BC Managed Care – PPO | Admitting: Orthopedic Surgery

## 2016-10-04 DIAGNOSIS — Z4889 Encounter for other specified surgical aftercare: Secondary | ICD-10-CM

## 2016-10-04 DIAGNOSIS — S76112D Strain of left quadriceps muscle, fascia and tendon, subsequent encounter: Secondary | ICD-10-CM

## 2016-10-04 MED ORDER — HYDROCODONE-ACETAMINOPHEN 7.5-325 MG PO TABS: 1.0000 | ORAL_TABLET | Freq: Four times a day (QID) | ORAL | 0 refills | Status: DC | PRN

## 2016-10-04 NOTE — Patient Instructions (Signed)
0-50 brace weight bearing as tolerated and bend the knee in the brace 25 times per day

## 2016-10-04 NOTE — Progress Notes (Signed)
This is postop visit #1  Encounter Diagnoses  Name Primary?  Brian Roach. Aftercare following surgery Yes  . Rupture of left quadriceps tendon, subsequent encounter      The patient had a left quadriceps tendon rupture this is postop day 11 surgery was done on July 26  The patient developed bilateral pulmonary emboli and had to be put on heparin and is now on Elavil [name]  Followed by Dr. Juanetta GoslingHawkins  This suture line looks good his knee has minimal swelling his calf is supple and soft  He's had no chest pain or shortness of breath  He is weightbearing as tolerated with a knee immobilizer and was transferred to a range of motion brace 0-50 to be followed up in 3 weeks for advancement    Current Outpatient Prescriptions:  .  apixaban (ELIQUIS) 5 MG TABS tablet, Take 2 tablets (10 mg total) by mouth 2 (two) times daily., Disp: 60 tablet, Rfl: 5 .  [START ON 10/06/2016] apixaban (ELIQUIS) 5 MG TABS tablet, Take 1 tablet (5 mg total) by mouth 2 (two) times daily., Disp: 60 tablet, Rfl: 5 .  atorvastatin (LIPITOR) 20 MG tablet, TAKE 1 TABLET EVERY EVENING, Disp: 90 tablet, Rfl: 1 .  benazepril (LOTENSIN) 20 MG tablet, TAKE 1 TABLET (20 MG TOTAL) BY MOUTH DAILY, NEEDS TO BE SEEN, Disp: 90 tablet, Rfl: 0 .  diltiazem (CARDIZEM CD) 300 MG 24 hr capsule, TAKE 1 CAPSULE (300 MG TOTAL) BY MOUTH DAILY., Disp: 90 capsule, Rfl: 1 .  docusate sodium (COLACE) 100 MG capsule, Take 200 mg by mouth 2 (two) times daily., Disp: , Rfl:  .  HYDROcodone-acetaminophen (NORCO) 7.5-325 MG tablet, Take 1 tablet by mouth every 6 (six) hours as needed for moderate pain., Disp: 30 tablet, Rfl: 0 .  metFORMIN (GLUCOPHAGE-XR) 500 MG 24 hr tablet, TAKE 1 TABLET (500 MG TOTAL) BY MOUTH DAILY WITH BREAKFAST., Disp: 90 tablet, Rfl: 3 .  Multiple Vitamins-Minerals (ONE-A-DAY MENS HEALTH FORMULA PO), Take 1 tablet by mouth daily., Disp: , Rfl:  .  oxyCODONE-acetaminophen (PERCOCET/ROXICET) 5-325 MG tablet, Take 1 tablet by mouth every 4  (four) hours as needed for severe pain., Disp: 30 tablet, Rfl: 0 .  promethazine (PHENERGAN) 12.5 MG tablet, Take 1 tablet (12.5 mg total) by mouth every 6 (six) hours as needed for nausea., Disp: 30 tablet, Rfl: 0 .  terbinafine (LAMISIL) 250 MG tablet, Take 1 tablet (250 mg total) by mouth daily., Disp: 30 tablet, Rfl: 2

## 2016-10-27 ENCOUNTER — Ambulatory Visit (INDEPENDENT_AMBULATORY_CARE_PROVIDER_SITE_OTHER): Payer: BC Managed Care – PPO | Admitting: Orthopedic Surgery

## 2016-10-27 DIAGNOSIS — Z4889 Encounter for other specified surgical aftercare: Secondary | ICD-10-CM

## 2016-10-27 DIAGNOSIS — S76112D Strain of left quadriceps muscle, fascia and tendon, subsequent encounter: Secondary | ICD-10-CM

## 2016-10-27 MED ORDER — HYDROCODONE-ACETAMINOPHEN 7.5-325 MG PO TABS: 1.0000 | ORAL_TABLET | Freq: Four times a day (QID) | ORAL | 0 refills | Status: DC | PRN

## 2016-10-27 NOTE — Progress Notes (Signed)
Postop visit  Status post quadriceps tendon repair left knee on 09/23/2016 complicated by pulmonary embolus currently on ELOQUIS  He denies any shortness of breath or chest pain he's been able to weight-bear as tolerated with a hinged knee brace and a walker  His wound is clean dry and intact in terminal extension he has minimal extensor lag  His brace was advanced to 120  He will continue his anticoagulant he will follow-up in 6 weeks we refilled his pain medication he will see the therapist to use a cane  Encounter Diagnoses  Name Primary?  . Rupture of left quadriceps tendon, subsequent encounter Yes  . Aftercare following surgery    Meds ordered this encounter  Medications  . HYDROcodone-acetaminophen (NORCO) 7.5-325 MG tablet    Sig: Take 1 tablet by mouth every 6 (six) hours as needed for moderate pain.    Dispense:  30 tablet    Refill:  0

## 2016-11-08 ENCOUNTER — Ambulatory Visit: Payer: BC Managed Care – PPO | Attending: Orthopedic Surgery | Admitting: Physical Therapy

## 2016-11-08 DIAGNOSIS — R2689 Other abnormalities of gait and mobility: Secondary | ICD-10-CM | POA: Diagnosis not present

## 2016-11-08 NOTE — Therapy (Signed)
[facility] [address] [place], [place], [address] Phone: [phone]   Fax:  [phone]  Physical Therapy Evaluation  Patient Details  Name: Brian Roach MRN: [id number] Date of Birth: 1960/10/28 Referring Provider: Fuller Canada  Encounter Date: 11/08/2016      PT End of Session - 11/08/16 1127    Visit Number 1   Number of Visits 2   Date for PT Re-Evaluation 11/19/16   PT Start Time 1128   PT Stop Time 1202   PT Time Calculation (min) 34 min   Activity Tolerance Patient tolerated treatment well   Behavior During Therapy [facility] for tasks assessed/performed      Past Medical History:  Diagnosis Date  . Diabetes mellitus without complication (HCC)   . Hyperlipidemia   . Hypertension   . PE (pulmonary embolism)     Past Surgical History:  Procedure Laterality Date  . ANTERIOR CRUCIATE LIGAMENT REPAIR Right   . BACK SURGERY    . KNEE SURGERY    . LUNG SURGERY     Unspecified but was done for PE. Had chest tubes. Approx 2003  . QUADRICEPS TENDON REPAIR Left 09/23/2016   Procedure: REPAIR QUADRICEP TENDON;  Surgeon: Vickki Hearing, MD;  Location: [facility];  Service: Orthopedics;  Laterality: Left;  . WISDOM TOOTH EXTRACTION      There were no vitals filed for this visit.       Subjective Assessment - 11/08/16 1124    Subjective Patient tore his patellar tendon on 09/23/16 when he fell. He presents to therapy to learn to walk with a cane. Brace is set to approximately 90 degrees right know.   Pertinent History HTN, DM   Currently in Pain? No/denies            [facility] PT Assessment - 11/08/16 0001      Assessment   Medical Diagnosis tear of L patellar tendon   Referring Provider Fuller Canada   Onset Date/Surgical Date 09/23/16   Next MD Visit 12/08/16     Precautions   Precautions Knee   Precaution Comments Do not bend knee more than brace allows   Required Braces or Orthoses Knee Immobilizer -  Left  to 90 deg flexion      Restrictions   Other Position/Activity Restrictions Full WB     Balance Screen   Has the patient fallen in the past 6 months Yes   How many times? 1   Has the patient had a decrease in activity level because of a fear of falling?  No   Is the patient reluctant to leave their home because of a fear of falling?  No     Home Environment   Living Environment Private residence   Living Arrangements Spouse/significant other   Available Help at Discharge Family   Type of Home House   Home Access Stairs to enter   Entrance Stairs-Number of Steps 2   Entrance Stairs-Rails None   Home Layout Able to live on main level with bedroom/bathroom;Two level   Alternate Level Stairs-Number of Steps 15   Alternate Level Stairs-Rails None  there is a  ledge to hold onto    [employer] - 2 wheels;Cane - quad   Additional Comments no problem getting out of chairs with arms and bed     Prior Function   Level of Independence Independent with household mobility with device;Needs assistance with transfers  getting out of couch/recliner   Vocation Retired  Functional Tests   Functional tests Sit to Stand     Sit to Stand   Comments patient able to perform sit to stand using one UE for support; he requires min A x 1 from couch at home.     ROM / Strength   AROM / PROM / Strength --  L knee 0-90 deg     Ambulation/Gait   Ambulation/Gait Yes   Ambulation/Gait Assistance 6: Modified independent (Device/Increase time)   Ambulation Distance (Feet) 400 Feet   Assistive device Rolling walker;Small based quad cane   Gait Pattern Step-to pattern;Decreased dorsiflexion - left;Decreased hip/knee flexion - left;Decreased stance time - right   Ambulation Surface Level   Stairs Yes   Number of Stairs 1   Height of Stairs 6            Objective measurements completed on examination: See above findings.                  PT Education - 11/08/16  1207    Education provided Yes   Education Details gait training with walker and SBQC. Step up/down with SBQC. Sit to stand transfer from edge of seat using RLE for base and one UE assist.   Person(s) Educated Patient   Methods Explanation;Demonstration;Verbal cues   Comprehension Verbalized understanding;Returned demonstration;Need further instruction             PT Long Term Goals - 11/08/16 1216      PT LONG TERM GOAL #1   Title Patient able to amb safely in clinic on level surface and stairs with SBQC.   Time 2   Period Weeks   Status New   Target Date 11/19/16     PT LONG TERM GOAL #2   Title Patient able to perform sit to stand safely from low mat table (to simulate couch) without A of person.   Time 2   Period Weeks   Status New   Target Date 11/19/16                Plan - 11/08/16 1208    Clinical Impression Statement Patient presents today for low complexity evaluation for gait training with AD s/p L quad tendon repair. Patient uses a RW which was set 4 inches too short (PT adjusted to correct height). Patient has gait deviations using walker and has difficulty with sit to stand transfers at home from couch/recliner. He has a two story home, but is presently living on main level due to injury. He will benefit from PT to normalize gait pattern with least restrictive AD and for stair and transfer training.   Clinical Presentation Stable   Clinical Decision Making Low   Rehab Potential Excellent   PT Frequency 1x / week   PT Duration 2 weeks   PT Treatment/Interventions ADLs/Self Care Home Management;Gait training;Stair training;Functional mobility training;Therapeutic activities;Therapeutic exercise;Patient/family education   PT Next Visit Plan Last visit - gait training with SBQC; stair training and sit to stand from low surface; D/C to HEP.   PT Home Exercise Plan amb with walker without lifting and using full step/stride and good heel strike; amb with SBQC    Consulted and Agree with Plan of Care Patient      Patient will benefit from skilled therapeutic intervention in order to improve the following deficits and impairments:  Abnormal gait, Decreased range of motion  Visit Diagnosis: Other abnormalities of gait and mobility - Plan: PT plan of care cert/re-cert  Problem List Patient Active Problem List   Diagnosis Date Noted  . Acute pulmonary embolism (HCC) 09/28/2016  . Quadriceps muscle rupture, left, initial encounter 09/23/2016  . Knee injury 09/22/2016  . Diabetes mellitus without complication (HCC) 06/17/2016  . DM (diabetes mellitus), type 2, uncontrolled (HCC) 11/17/2015  . Sciatica, right side 11/17/2015  . Allergic rhinitis 02/19/2015  . Irritable bowel syndrome 02/19/2015  . Left shoulder pain 05/27/2012  . History of pulmonary embolism 05/27/2012  . Obesity 05/27/2012  . HTN (hypertension), benign 05/27/2012  . Hypercholesteremia 05/27/2012    Solon Palm PT 11/08/2016, 12:23 PM  [facility] [address] [place], [place], [address] Phone: [phone]   Fax:  [phone]  Name: NOCHUM FENTER MRN: [id number] Date of Birth: 1960/05/17

## 2016-11-15 ENCOUNTER — Ambulatory Visit: Payer: BC Managed Care – PPO | Admitting: Physical Therapy

## 2016-11-15 ENCOUNTER — Encounter: Payer: Self-pay | Admitting: Physical Therapy

## 2016-11-15 DIAGNOSIS — R2689 Other abnormalities of gait and mobility: Secondary | ICD-10-CM | POA: Diagnosis not present

## 2016-11-15 NOTE — Therapy (Addendum)
[facility] [address] [place], [place], [address] Phone: [phone]   Fax:  [phone]  Physical Therapy Treatment and Discharge Summary Patient Details  Name: Brian Roach MRN: [id number] Date of Birth: 02/19/1961 Referring Provider: Fuller Canada  Encounter Date: 11/15/2016      PT End of Session - 11/15/16 0944    Visit Number 2   Number of Visits 2   Date for PT Re-Evaluation 11/19/16   PT Start Time 0947   PT Stop Time 1009   PT Time Calculation (min) 22 min   Activity Tolerance Patient tolerated treatment well   Behavior During Therapy [facility] for tasks assessed/performed      Past Medical History:  Diagnosis Date  . Diabetes mellitus without complication (HCC)   . Hyperlipidemia   . Hypertension   . PE (pulmonary embolism)     Past Surgical History:  Procedure Laterality Date  . ANTERIOR CRUCIATE LIGAMENT REPAIR Right   . BACK SURGERY    . KNEE SURGERY    . LUNG SURGERY     Unspecified but was done for PE. Had chest tubes. Approx 2003  . QUADRICEPS TENDON REPAIR Left 09/23/2016   Procedure: REPAIR QUADRICEP TENDON;  Surgeon: Vickki Hearing, MD;  Location: [facility];  Service: Orthopedics;  Laterality: Left;  . WISDOM TOOTH EXTRACTION      There were no vitals filed for this visit.      Subjective Assessment - 11/15/16 0944    Subjective Reports that he does have trouble with getting out of chairs that do not have UE support. Reports that at times he uses his armrest on recliner and walker on other side.   Pertinent History HTN, DM   Currently in Pain? No/denies            [facility] PT Assessment - 11/15/16 0001      Assessment   Medical Diagnosis tear of L patellar tendon   Onset Date/Surgical Date 09/23/16   Next MD Visit 12/08/16     Precautions   Precautions Knee   Precaution Comments Do not bend knee more than brace allows   Required Braces or Orthoses Knee Immobilizer - Right      Restrictions   Other Position/Activity Restrictions Full WB                     [facility] Adult PT Treatment/Exercise - 11/15/16 0001      Ambulation/Gait   Ambulation/Gait Yes   Ambulation/Gait Assistance 6: Modified independent (Device/Increase time)   Ambulation Distance (Feet) 140 Feet   Assistive device Small based quad cane   Gait Pattern Step-through pattern;Decreased arm swing - left;Decreased hip/knee flexion - left;Decreased weight shift to left;Lateral trunk lean to right;Decreased trunk rotation   Ambulation Surface Level;Indoor     Exercises   Exercises Knee/Hip     Knee/Hip Exercises: Aerobic   Nustep L3 x9 min     Knee/Hip Exercises: Standing   Forward Step Up Left;2 sets;10 reps;Hand Hold: 1;Step Height: 6"     Knee/Hip Exercises: Seated   Sit to Sand 15 reps;without UE support  in staggered stance and even stance from high to mid plinth                      PT Long Term Goals - 11/15/16 1009      PT LONG TERM GOAL #1   Title Patient able to amb safely in clinic on level surface  and stairs with SBQC.   Time 2   Period Weeks   Status Achieved     PT LONG TERM GOAL #2   Title Patient able to perform sit to stand safely from low mat table (to simulate couch) without A of person.   Time 2   Period Weeks   Status Achieved               Plan - 11/15/16 1015    Clinical Impression Statement Patient presented in clinic with no current pain and reports of only difficulty in being with sit to stands. Patient ambulated into clinic with [facility] with only deficit during gait being the R lateral lean with ambulation. Patient VC'd to correct which may assist with back pain. Patient also educated to complete sit to stands in staggered stance with LLE more anterior than RLE. Patient confident with staggered stance thus even stance completed to which patient as also confident. LLE forward step ups were completed with patient educated regarding  technique and as well as AD placement. Patient could "feel it" during forward step ups in regards to L knee pain. Today's treatment completed with L knee brace donned. Patient educated that he could ice his knee for 10-15 minutes 3-4 times per day for pain or edema if required. Achieved all goals at this time.    Rehab Potential Excellent   PT Frequency 1x / week   PT Duration 2 weeks   PT Treatment/Interventions ADLs/Self Care Home Management;Gait training;Stair training;Functional mobility training;Therapeutic activities;Therapeutic exercise;Patient/family education   PT Next Visit Plan D/C summary required.   PT Home Exercise Plan amb with walker without lifting and using full step/stride and good heel strike; amb with SBQC   Consulted and Agree with Plan of Care Patient      Patient will benefit from skilled therapeutic intervention in order to improve the following deficits and impairments:  Abnormal gait, Decreased range of motion  Visit Diagnosis: Other abnormalities of gait and mobility     Problem List Patient Active Problem List   Diagnosis Date Noted  . Acute pulmonary embolism ([place]) 09/28/2016  . Quadriceps muscle rupture, left, initial encounter 09/23/2016  . Knee injury 09/22/2016  . Diabetes mellitus without complication ([place]) 25/36/6440  . DM (diabetes mellitus), type 2, uncontrolled ([place]) 11/17/2015  . Sciatica, right side 11/17/2015  . Allergic rhinitis 02/19/2015  . Irritable bowel syndrome 02/19/2015  . Left shoulder pain 05/27/2012  . History of pulmonary embolism 05/27/2012  . Obesity 05/27/2012  . HTN (hypertension), benign 05/27/2012  . Hypercholesteremia 05/27/2012    Ahmed Prima, PTA 11/15/16 10:28 AM  [facility] [address] [place], [place], [address] Phone: [phone]   Fax:  [phone]  Name: Brian Roach MRN: [id number] Date of Birth: 1960-04-30   PHYSICAL THERAPY DISCHARGE  SUMMARY  Visits from Start of Care:2  Current functional level related to goals / functional outcomes: See above   Remaining deficits: See above   Education / Equipment: HEP Plan: Patient agrees to discharge.  Patient goals were met. Patient is being discharged due to meeting the stated rehab goals.  ?????         Madelyn Flavors, PT 11/19/16 8:25 AM; [facility] [place], [place], [address] Phone: [phone]   Fax:  [phone]

## 2016-12-08 ENCOUNTER — Ambulatory Visit (INDEPENDENT_AMBULATORY_CARE_PROVIDER_SITE_OTHER): Payer: BC Managed Care – PPO | Admitting: Orthopedic Surgery

## 2016-12-08 ENCOUNTER — Other Ambulatory Visit: Payer: Self-pay | Admitting: Family Medicine

## 2016-12-08 ENCOUNTER — Encounter: Payer: Self-pay | Admitting: Orthopedic Surgery

## 2016-12-08 VITALS — BP 142/86 | HR 98 | Ht 70.0 in | Wt 260.0 lb

## 2016-12-08 DIAGNOSIS — S76112D Strain of left quadriceps muscle, fascia and tendon, subsequent encounter: Secondary | ICD-10-CM

## 2016-12-08 DIAGNOSIS — Z4889 Encounter for other specified surgical aftercare: Secondary | ICD-10-CM

## 2016-12-08 NOTE — Progress Notes (Signed)
Postop appointment  Chief Complaint  Patient presents with  . Routine Post Op    Rupture of left quadriceps tendon DOS 09/23/16    Encounter Diagnoses  Name Primary?  . Rupture of left quadriceps tendon, subsequent encounter   . Aftercare following surgery Yes    His pain is controlled now not taking the oral opioids  He has following range of motion is flexion is 120 his extension is 5.  He is using a cane  No breathing issues. Review of postop pulmonary embolism is on oral anticoagulation  Continue exercises Use brace for activities greater than an hour, continue cane use  Come back in 4 weeks

## 2016-12-08 NOTE — Patient Instructions (Signed)
Cane   Brace for activity more than an hour

## 2016-12-17 ENCOUNTER — Emergency Department (HOSPITAL_COMMUNITY): Payer: BC Managed Care – PPO

## 2016-12-17 ENCOUNTER — Emergency Department (HOSPITAL_COMMUNITY)
Admission: EM | Admit: 2016-12-17 | Discharge: 2016-12-18 | Disposition: A | Discharge status: Home / Self Care | Payer: BC Managed Care – PPO | Attending: Emergency Medicine | Admitting: Emergency Medicine

## 2016-12-17 ENCOUNTER — Encounter: Payer: Self-pay | Admitting: Family Medicine

## 2016-12-17 ENCOUNTER — Encounter (HOSPITAL_COMMUNITY): Payer: Self-pay | Admitting: Emergency Medicine

## 2016-12-17 ENCOUNTER — Ambulatory Visit (INDEPENDENT_AMBULATORY_CARE_PROVIDER_SITE_OTHER): Payer: BC Managed Care – PPO | Admitting: Family Medicine

## 2016-12-17 VITALS — BP 122/77 | HR 83 | Temp 97.7°F | Ht 70.0 in | Wt 259.0 lb

## 2016-12-17 DIAGNOSIS — Z79899 Other long term (current) drug therapy: Secondary | ICD-10-CM | POA: Insufficient documentation

## 2016-12-17 DIAGNOSIS — Z23 Encounter for immunization: Secondary | ICD-10-CM

## 2016-12-17 DIAGNOSIS — R0602 Shortness of breath: Secondary | ICD-10-CM | POA: Diagnosis not present

## 2016-12-17 DIAGNOSIS — Z7901 Long term (current) use of anticoagulants: Secondary | ICD-10-CM | POA: Diagnosis not present

## 2016-12-17 DIAGNOSIS — E119 Type 2 diabetes mellitus without complications: Secondary | ICD-10-CM | POA: Diagnosis not present

## 2016-12-17 DIAGNOSIS — I10 Essential (primary) hypertension: Secondary | ICD-10-CM | POA: Insufficient documentation

## 2016-12-17 DIAGNOSIS — Z7984 Long term (current) use of oral hypoglycemic drugs: Secondary | ICD-10-CM | POA: Insufficient documentation

## 2016-12-17 DIAGNOSIS — J302 Other seasonal allergic rhinitis: Secondary | ICD-10-CM

## 2016-12-17 DIAGNOSIS — R079 Chest pain, unspecified: Secondary | ICD-10-CM | POA: Diagnosis present

## 2016-12-17 DIAGNOSIS — R0789 Other chest pain: Secondary | ICD-10-CM | POA: Diagnosis not present

## 2016-12-17 LAB — BASIC METABOLIC PANEL
ANION GAP: 12 (ref 5–15)
BUN: 18 mg/dL (ref 6–20)
CALCIUM: 9.7 mg/dL (ref 8.9–10.3)
CO2: 25 mmol/L (ref 22–32)
Chloride: 101 mmol/L (ref 101–111)
Creatinine, Ser: 1.16 mg/dL (ref 0.61–1.24)
GFR calc Af Amer: >60 mL/min (ref >60)
GFR calc non Af Amer: >60 mL/min (ref >60)
GLUCOSE: 114 mg/dL — AB (ref 65–99)
Potassium: 4 mmol/L (ref 3.5–5.1)
Sodium: 138 mmol/L (ref 135–145)

## 2016-12-17 LAB — CBC
HCT: 48.3 % (ref 39.0–52.0)
HEMOGLOBIN: 16.4 g/dL (ref 13.0–17.0)
MCH: 30.9 pg (ref 26.0–34.0)
MCHC: 34 g/dL (ref 30.0–36.0)
MCV: 91.1 fL (ref 78.0–100.0)
Platelets: 277 10*3/uL (ref 150–400)
RBC: 5.3 MIL/uL (ref 4.22–5.81)
RDW: 13.4 % (ref 11.5–15.5)
WBC: 15.3 10*3/uL — ABNORMAL HIGH (ref 4.0–10.5)

## 2016-12-17 LAB — TROPONIN I

## 2016-12-17 MED ORDER — GI COCKTAIL ~~LOC~~
30.0000 mL | Freq: Once | ORAL | Status: AC
  Administered 2016-12-17: 30 mL via ORAL
  Filled 2016-12-17: qty 30

## 2016-12-17 MED ORDER — CETIRIZINE HCL 10 MG PO TABS: 10.0000 mg | ORAL_TABLET | Freq: Every day | ORAL | 3 refills | Status: DC

## 2016-12-17 MED ORDER — IOPAMIDOL (ISOVUE-370) INJECTION 76%
100.0000 mL | Freq: Once | INTRAVENOUS | Status: AC | PRN
  Administered 2016-12-18: 100 mL via INTRAVENOUS

## 2016-12-17 NOTE — ED Notes (Signed)
Pt in no distress. Denies pain. Resting comfortable with EDP at bedside

## 2016-12-17 NOTE — ED Triage Notes (Signed)
Chest tightness x 2 days, hx of blood clots in lungs, s/p knee surgery.

## 2016-12-17 NOTE — ED Provider Notes (Signed)
[facility] EMERGENCY DEPARTMENT Provider Note   CSN: [id number] Arrival date & time: 12/17/16  2001     History   Chief Complaint Chief Complaint  Patient presents with  . Chest Pain    HPI Brian Roach is a 56 y.o. male.  Patient with history of known pulmonary embolism on Eliquis presenting with 2 days of intermittent left-sided chest tightness.  Describes pain that comes and goes lasting a few minutes at a time.  It does not radiate.  Associated with some shortness of breath.  Describes 2 days of constant "indigestion" along with nausea.  No vomiting, diarrhea or constipation.  Denies any history of GERD or acid reflux.  He has not been taking any pain medicine at this time.  States compliance with his Eliquis though missed his nighttime dose tonight.  He is concerned because his doctor told her come to the hospital he was having any chest pain or shortness of breath.  Denies any cardiac history.  Denies any history of heart failure.  He is not having any chest pain currently.   The history is provided by the patient.  Chest Pain   Associated symptoms include shortness of breath. Pertinent negatives include no abdominal pain, no cough, no dizziness, no fever, no headaches, no nausea, no vomiting and no weakness.    Past Medical History:  Diagnosis Date  . Diabetes mellitus without complication (HCC)   . Hyperlipidemia   . Hypertension   . PE (pulmonary embolism)     Patient Active Problem List   Diagnosis Date Noted  . Acute pulmonary embolism (HCC) 09/28/2016  . Quadriceps muscle rupture, left, initial encounter 09/23/2016  . Knee injury 09/22/2016  . Diabetes mellitus without complication (HCC) 06/17/2016  . DM (diabetes mellitus), type 2, uncontrolled (HCC) 11/17/2015  . Sciatica, right side 11/17/2015  . Allergic rhinitis 02/19/2015  . Irritable bowel syndrome 02/19/2015  . Left shoulder pain 05/27/2012  . History of pulmonary embolism 05/27/2012  . Obesity  05/27/2012  . HTN (hypertension), benign 05/27/2012  . Hypercholesteremia 05/27/2012    Past Surgical History:  Procedure Laterality Date  . ANTERIOR CRUCIATE LIGAMENT REPAIR Right   . BACK SURGERY    . KNEE SURGERY    . LUNG SURGERY     Unspecified but was done for PE. Had chest tubes. Approx 2003  . QUADRICEPS TENDON REPAIR Left 09/23/2016   Procedure: REPAIR QUADRICEP TENDON;  Surgeon: Vickki Hearing, MD;  Location: [facility];  Service: Orthopedics;  Laterality: Left;  . WISDOM TOOTH EXTRACTION         Home Medications    Prior to Admission medications   Medication Sig Start Date End Date Taking? Authorizing Provider  apixaban (ELIQUIS) 5 MG TABS tablet Take 1 tablet (5 mg total) by mouth 2 (two) times daily. 10/06/16  Yes Kari Baars, MD  atorvastatin (LIPITOR) 20 MG tablet TAKE 1 TABLET EVERY EVENING 07/19/16  Yes Stacks, Broadus John, MD  benazepril (LOTENSIN) 20 MG tablet TAKE 1 TABLET BY MOUTH EVERY DAY 12/08/16  Yes Stacks, Broadus John, MD  diltiazem (CARDIZEM CD) 300 MG 24 hr capsule TAKE 1 CAPSULE (300 MG TOTAL) BY MOUTH DAILY. 08/11/16  Yes Stacks, Broadus John, MD  docusate sodium (COLACE) 100 MG capsule Take 200 mg by mouth 2 (two) times daily.   Yes [provider]  metFORMIN (GLUCOPHAGE-XR) 500 MG 24 hr tablet TAKE 1 TABLET (500 MG TOTAL) BY MOUTH DAILY WITH BREAKFAST. 03/19/16  Yes [name], MD  Multiple Vitamins-Minerals (  ONE-A-DAY MENS HEALTH FORMULA PO) Take 1 tablet by mouth daily.   Yes [provider]  cetirizine (ZYRTEC) 10 MG tablet Take 1 tablet (10 mg total) by mouth daily. For allergy symptoms 12/17/16   Mechele ClaudeStacks, Warren, MD    Family History Family History  Problem Relation Age of Onset  . Heart disease Father     Social History Social History  Substance Use Topics  . Smoking status: Never Smoker  . Smokeless tobacco: Never Used  . Alcohol use No     Allergies   Patient has no known allergies.   Review of Systems Review of  Systems  Constitutional: Negative for activity change, appetite change and fever.  HENT: Negative for congestion and rhinorrhea.   Respiratory: Positive for chest tightness and shortness of breath. Negative for cough.   Cardiovascular: Positive for chest pain.  Gastrointestinal: Negative for abdominal pain, nausea and vomiting.  Genitourinary: Negative for dysuria and hematuria.  Musculoskeletal: Negative for arthralgias and myalgias.  Skin: Negative for rash.  Neurological: Negative for dizziness, weakness and headaches.   all other systems are negative except as noted in the HPI and PMH.     Physical Exam Updated Vital Signs BP (!) 126/92   Pulse 77   Temp 98.6 F (37 C) (Oral)   Resp 13   Ht 5\' 10"  (1.778 m)   Wt 117.9 kg (260 lb)   SpO2 98%   BMI 37.31 kg/m   Physical Exam  Constitutional: He is oriented to person, place, and time. He appears well-developed and well-nourished. No distress.  HENT:  Head: Normocephalic and atraumatic.  Mouth/Throat: Oropharynx is clear and moist. No oropharyngeal exudate.  Eyes: Pupils are equal, round, and reactive to light. Conjunctivae and EOM are normal.  Neck: Normal range of motion. Neck supple.  No meningismus.  Cardiovascular: Normal rate, regular rhythm, normal heart sounds and intact distal pulses.   No murmur heard. Pulmonary/Chest: Effort normal and breath sounds normal. No respiratory distress. He exhibits no tenderness.  Abdominal: Soft. There is no tenderness. There is no rebound and no guarding.  Musculoskeletal: Normal range of motion. He exhibits no edema or tenderness.  LLE in knee brace  Neurological: He is alert and oriented to person, place, and time. No cranial nerve deficit. He exhibits normal muscle tone. Coordination normal.   5/5 strength throughout. CN 2-12 intact.Equal grip strength.   Skin: Skin is warm.  Psychiatric: He has a normal mood and affect. His behavior is normal.  Nursing note and vitals  reviewed.    ED Treatments / Results  Labs (all labs ordered are listed, but only abnormal results are displayed) Labs Reviewed  BASIC METABOLIC PANEL - Abnormal; Notable for the following:       Result Value   Glucose, Bld 114 (*)    All other components within normal limits  CBC - Abnormal; Notable for the following:    WBC 15.3 (*)    All other components within normal limits  TROPONIN I  TROPONIN I    EKG  EKG Interpretation  Date/Time:  Friday December 17 2016 20:14:17 EDT Ventricular Rate:  80 PR Interval:  164 QRS Duration: 84 QT Interval:  364 QTC Calculation: 419 R Axis:   5 Text Interpretation:  Normal sinus rhythm Septal infarct , age undetermined Abnormal ECG No significant change was found Confirmed by Glynn Octaveancour, Veola Cafaro [phone]([id number]) on 12/17/2016 11:41:37 PM       Radiology Dg Chest 2 View  Result Date: 12/17/2016  CLINICAL DATA:  Chest pain.  History of pulmonary embolism. EXAM: CHEST  2 VIEW COMPARISON:  09/24/2016 FINDINGS: Lower thoracic spondylosis. Lateral view degraded by patient arm position. Moderate right hemidiaphragm elevation is similar. Midline trachea. Normal heart size. No pleural effusion or pneumothorax. Slight increase in probable scarring at the right lung base laterally. Clear left lung. IMPRESSION: No acute cardiopulmonary disease. Moderate right hemidiaphragm elevation with progressive right lung base scarring. Electronically Signed   By: Jeronimo Greaves M.D.   On: 12/17/2016 21:21   Ct Angio Chest Pe W And/or Wo Contrast  Result Date: 12/18/2016 CLINICAL DATA:  Chest tightness for 2 days. History of blood clots in the lungs. Status post knee surgery. EXAM: CT ANGIOGRAPHY CHEST WITH CONTRAST TECHNIQUE: Multidetector CT imaging of the chest was performed using the standard protocol during bolus administration of intravenous contrast. Multiplanar CT image reconstructions and MIPs were obtained to evaluate the vascular anatomy. CONTRAST:  100 mL Isovue  370 COMPARISON:  09/24/2016 FINDINGS: Cardiovascular: Good opacification of the central and segmental pulmonary artery's. No residual filling defects are identified. This represents interval resolution of previous pulmonary emboli. No evidence of acute pulmonary embolus today. Normal caliber thoracic aorta. No aortic dissection. Great vessel origins are patent. Minimal vascular calcifications in the aorta. Normal heart size. No pericardial effusion. Mediastinum/Nodes: No significant lymphadenopathy. Esophagus is mostly decompressed. Lungs/Pleura: Motion artifact limits evaluation of the lungs. Mild dependent changes. No airspace disease or consolidation. No pleural effusions. No pneumothorax. Prominent pleural fat. Upper Abdomen: No acute process demonstrated in the visualized upper abdomen. Musculoskeletal: Degenerative changes throughout the spine. No destructive bone lesions. Review of the MIP images confirms the above findings. IMPRESSION: 1. No evidence of significant pulmonary embolus, indicating interval resolution of previous pulmonary emboli. 2. No evidence of active pulmonary disease. Electronically Signed   By: Burman Nieves M.D.   On: 12/18/2016 00:52    Procedures Procedures (including critical care time)  Medications Ordered in ED Medications  gi cocktail (Maalox,Lidocaine,Donnatal) (not administered)     Initial Impression / Assessment and Plan / ED Course  I have reviewed the triage vital signs and the nursing notes.  Pertinent labs & imaging results that were available during my care of the patient were reviewed by me and considered in my medical decision making (see chart for details).    History of pulmonary embolism on Eliquis for 2 days of intermittent left-sided chest tightness.  None at this time.  EKG is nonischemic.  Negative troponin.  Troponin negative x2.  CT is negative for pulmonary embolism.  Previous PE has resolved.  Pain is atypical for ACS.  Follow-up with  PCP and cardiology.  Continue Eliquis.  Return precautions discussed.  Will add PPI.  Follow up with PCP. Return precautions discussed.   Final Clinical Impressions(s) / ED Diagnoses   Final diagnoses:  Atypical chest pain    New Prescriptions New Prescriptions   No medications on file     Glynn Octave, MD 12/18/16 [phone]

## 2016-12-17 NOTE — Progress Notes (Signed)
Chief Complaint  Patient presents with  . Nasal Congestion    pt here today c/o congestion    HPI  Patient presents today for Congestion in his head pressure in the ears. Postnasal drainage scratchy throat and occasional cough no fever chills sweats or shortness of breath. Cough is nonproductive  PMH: Smoking status noted ROS: Per HPI  Objective: BP 122/77   Pulse 83   Temp 97.7 F (36.5 C) (Oral)   Ht 5\' 10"  (1.778 m)   Wt 259 lb (117.5 kg)   BMI 37.16 kg/m  Gen: NAD, alert, cooperative with exam HEENT: NCAT, EOMI, PERRL CV: RRR, good S1/S2, no murmur Resp: CTABL, no wheezes, non-labored  Ext: No edema, warm Neuro: Alert and oriented, No gross deficits  Assessment and plan:  1. Seasonal allergic rhinitis, unspecified trigger     Meds ordered this encounter  Medications  . cetirizine (ZYRTEC) 10 MG tablet    Sig: Take 1 tablet (10 mg total) by mouth daily. For allergy symptoms    Dispense:  90 tablet    Refill:  3    Patient is past due for follow-up for his diabetes. This is because he was hospitalized for an injury to the left knee. Surgery for repair of the patellar tendon was complicated by pulmonary embolism and intracardiac embolism. He is now taking Eliquis. He is concerned about taking any medication that might interfere with the blood thinner.  Follow up soon for diabetes check.[name].  Yardley Beltran, MD

## 2016-12-18 LAB — TROPONIN I

## 2016-12-18 MED ORDER — OMEPRAZOLE 20 MG PO CPDR: 20.0000 mg | DELAYED_RELEASE_CAPSULE | Freq: Every day | ORAL | 0 refills | Status: DC

## 2016-12-18 NOTE — ED Notes (Signed)
Returned from xray

## 2016-12-18 NOTE — ED Notes (Signed)
Pt transported to CT ?

## 2016-12-18 NOTE — Discharge Instructions (Signed)
There is no evidence of heart attack or blood clot in the lung.  Continue to take your blood thinner.  Follow-up with your primary care physician for a stress test.  Return to the ED if you develop new or worsening symptoms.

## 2016-12-21 ENCOUNTER — Observation Stay (HOSPITAL_COMMUNITY)
Admission: EM | Admit: 2016-12-21 | Discharge: 2016-12-23 | Disposition: A | Discharge status: Home / Self Care | Payer: BC Managed Care – PPO | Attending: Internal Medicine | Admitting: Internal Medicine

## 2016-12-21 ENCOUNTER — Other Ambulatory Visit: Payer: Self-pay

## 2016-12-21 ENCOUNTER — Encounter (HOSPITAL_COMMUNITY): Payer: Self-pay | Admitting: *Deleted

## 2016-12-21 ENCOUNTER — Emergency Department (HOSPITAL_COMMUNITY): Payer: BC Managed Care – PPO

## 2016-12-21 DIAGNOSIS — I471 Supraventricular tachycardia, unspecified: Secondary | ICD-10-CM

## 2016-12-21 DIAGNOSIS — I10 Essential (primary) hypertension: Secondary | ICD-10-CM | POA: Diagnosis present

## 2016-12-21 DIAGNOSIS — Z79899 Other long term (current) drug therapy: Secondary | ICD-10-CM | POA: Insufficient documentation

## 2016-12-21 DIAGNOSIS — Z7901 Long term (current) use of anticoagulants: Secondary | ICD-10-CM | POA: Insufficient documentation

## 2016-12-21 DIAGNOSIS — Z7984 Long term (current) use of oral hypoglycemic drugs: Secondary | ICD-10-CM | POA: Insufficient documentation

## 2016-12-21 DIAGNOSIS — E119 Type 2 diabetes mellitus without complications: Secondary | ICD-10-CM | POA: Diagnosis not present

## 2016-12-21 DIAGNOSIS — R002 Palpitations: Secondary | ICD-10-CM | POA: Diagnosis not present

## 2016-12-21 DIAGNOSIS — R079 Chest pain, unspecified: Secondary | ICD-10-CM | POA: Diagnosis not present

## 2016-12-21 DIAGNOSIS — Z86711 Personal history of pulmonary embolism: Secondary | ICD-10-CM

## 2016-12-21 DIAGNOSIS — E7800 Pure hypercholesterolemia, unspecified: Secondary | ICD-10-CM | POA: Diagnosis present

## 2016-12-21 LAB — CBC
HEMATOCRIT: 48.2 % (ref 39.0–52.0)
HEMOGLOBIN: 16.5 g/dL (ref 13.0–17.0)
MCH: 30.8 pg (ref 26.0–34.0)
MCHC: 34.2 g/dL (ref 30.0–36.0)
MCV: 90.1 fL (ref 78.0–100.0)
Platelets: 281 10*3/uL (ref 150–400)
RBC: 5.35 MIL/uL (ref 4.22–5.81)
RDW: 13.3 % (ref 11.5–15.5)
WBC: 12 10*3/uL — ABNORMAL HIGH (ref 4.0–10.5)

## 2016-12-21 LAB — BASIC METABOLIC PANEL
ANION GAP: 10 (ref 5–15)
BUN: 13 mg/dL (ref 6–20)
CALCIUM: 9.8 mg/dL (ref 8.9–10.3)
CHLORIDE: 106 mmol/L (ref 101–111)
CO2: 25 mmol/L (ref 22–32)
Creatinine, Ser: 0.94 mg/dL (ref 0.61–1.24)
GFR calc non Af Amer: >60 mL/min (ref >60)
GLUCOSE: 115 mg/dL — AB (ref 65–99)
POTASSIUM: 4.3 mmol/L (ref 3.5–5.1)
Sodium: 141 mmol/L (ref 135–145)

## 2016-12-21 LAB — TROPONIN I: Troponin I: <0.03 ng/mL (ref <0.03)

## 2016-12-21 NOTE — ED Triage Notes (Signed)
Pt with palpitations earlier but denies at present, left sided chest tightness and left shoulder soreness.  Pt with hx of blood clots.

## 2016-12-21 NOTE — H&P (Signed)
[facility] H&P    Patient Demographics:    Brian Roach, is a 56 y.o. male  MRN: [id number]  DOB - 03-09-1960  Admit Date - 12/21/2016  Referring MD/NP/PA: Dr. Adriana Simas  Outpatient Primary MD for the patient is [name], MD  Patient coming from: home  Chief Complaint  Patient presents with  . Chest Pain      HPI:    Brian Roach  is a 56 y.o. male, with history of pulmonary embolism developed after knee surgery on 09/23/2016 currently on eliquis, hypertension, diabetes mellitus came to hospital after patient had left-sided chest pain with palpitations. Patient describes pain as tightness in the left shoulder, and also sensations of fluttering in the heart. He denies passing out. No blurred vision. Denies shortness of breath. No nausea vomiting or diarrhea.  In the ED cardiac enzymes 2 have been negative.    Review of systems:      All other systems reviewed and are negative.   With Past History of the following :    Past Medical History:  Diagnosis Date  . Diabetes mellitus without complication (HCC)   . Hyperlipidemia   . Hypertension   . PE (pulmonary embolism)       Past Surgical History:  Procedure Laterality Date  . ANTERIOR CRUCIATE LIGAMENT REPAIR Right   . BACK SURGERY    . KNEE SURGERY    . LUNG SURGERY     Unspecified but was done for PE. Had chest tubes. Approx 2003  . QUADRICEPS TENDON REPAIR Left 09/23/2016   Procedure: REPAIR QUADRICEP TENDON;  Surgeon: Vickki Hearing, MD;  Location: [facility];  Service: Orthopedics;  Laterality: Left;  . WISDOM TOOTH EXTRACTION        Social History:      Social History  Substance Use Topics  . Smoking status: Never Smoker  . Smokeless tobacco: Never Used  . Alcohol use No       Family History :     Family History  Problem Relation Age of Onset  . Heart disease Father       Home Medications:   Prior to  Admission medications   Medication Sig Start Date End Date Taking? Authorizing Provider  apixaban (ELIQUIS) 5 MG TABS tablet Take 1 tablet (5 mg total) by mouth 2 (two) times daily. 10/06/16  Yes Kari Baars, MD  atorvastatin (LIPITOR) 20 MG tablet TAKE 1 TABLET EVERY EVENING 07/19/16  Yes Stacks, Broadus John, MD  benazepril (LOTENSIN) 20 MG tablet TAKE 1 TABLET BY MOUTH EVERY DAY 12/08/16  Yes [name], MD  cetirizine (ZYRTEC) 10 MG tablet Take 1 tablet (10 mg total) by mouth daily. For allergy symptoms 12/17/16  Yes Stacks, Broadus John, MD  diltiazem (CARDIZEM CD) 300 MG 24 hr capsule TAKE 1 CAPSULE (300 MG TOTAL) BY MOUTH DAILY. 08/11/16  Yes Stacks, Broadus John, MD  docusate sodium (COLACE) 100 MG capsule Take 200 mg by mouth 2 (two) times daily.   Yes [provider]  metFORMIN (GLUCOPHAGE-XR) 500 MG 24 hr tablet TAKE 1 TABLET (500  MG TOTAL) BY MOUTH DAILY WITH BREAKFAST. 03/19/16  Yes Stacks, Broadus John, MD  Multiple Vitamins-Minerals (ONE-A-DAY MENS HEALTH FORMULA PO) Take 1 tablet by mouth daily.   Yes [provider]  omeprazole (PRILOSEC) 20 MG capsule Take 1 capsule (20 mg total) by mouth daily. 12/18/16  Yes Rancour, Jeannett Senior, MD     Allergies:    No Known Allergies   Physical Exam:   Vitals  Blood pressure 131/84, pulse 70, temperature 97.6 F (36.4 C), temperature source Oral, resp. rate 17, height 5\' 10"  (1.778 m), weight 117.5 kg (259 lb), SpO2 96 %.  1.  General: Appears in no acute distress  2. Psychiatric:  Intact judgement and  insight, awake alert, oriented x 3.  3. Neurologic: No focal neurological deficits, all cranial nerves intact.Strength 5/5 all 4 extremities, sensation intact all 4 extremities, plantars down going.  4. Eyes :  anicteric sclerae, moist conjunctivae with no lid lag. PERRLA.  5. ENMT:  Oropharynx clear with moist mucous membranes and good dentition  6. Neck:  supple, no cervical lymphadenopathy appriciated, No thyromegaly  7.  Respiratory : Normal respiratory effort, good air movement bilaterally,clear to  auscultation bilaterally  8. Cardiovascular : RRR, no gallops, rubs or murmurs, no leg edema  9. Gastrointestinal:  Positive bowel sounds, abdomen soft, non-tender to palpation,no hepatosplenomegaly, no rigidity or guarding       10. Skin:  No cyanosis, normal texture and turgor, no rash, lesions or ulcers  11.Musculoskeletal:  Good muscle tone,  joints appear normal , no effusions,  normal range of motion    Data Review:    CBC  Recent Labs Lab 12/17/16 2032 12/21/16 1717  WBC 15.3* 12.0*  HGB 16.4 16.5  HCT 48.3 48.2  PLT 277 281  MCV 91.1 90.1  MCH 30.9 30.8  MCHC 34.0 34.2  RDW 13.4 13.3   ------------------------------------------------------------------------------------------------------------------  Chemistries   Recent Labs Lab 12/17/16 2032 12/21/16 1717  NA 138 141  K 4.0 4.3  CL 101 106  CO2 25 25  GLUCOSE 114* 115*  BUN 18 13  CREATININE 1.16 0.94  CALCIUM 9.7 9.8   ------------------------------------------------------------------------------------------------------------------  ---------------------------------------------------------------------------------------------Cardiac Enzymes:  Recent Labs Lab 12/17/16 2032 12/18/16 0002 12/21/16 1717 12/21/16 2008  TROPONINI <0.03 <0.03 <0.03 <0.03    --------------------------------------------------------------------------------------------------------------- Urine analysis:    Component Value Date/Time   APPEARANCEUR Clear 03/02/2016 1026   GLUCOSEU Negative 03/02/2016 1026   BILIRUBINUR Negative 03/02/2016 1026   PROTEINUR Negative 03/02/2016 1026   NITRITE Negative 03/02/2016 1026   LEUKOCYTESUR Trace (A) 03/02/2016 1026      Imaging Results:    Dg Chest 2 View  Result Date: 12/21/2016 CLINICAL DATA:  Chest pain and tightness beginning last night. EXAM: CHEST  2 VIEW COMPARISON:  CT chest and  PA and lateral chest 12/17/2016. PA and lateral chest 05/27/2012. FINDINGS: Volume loss in the right chest and mild right basilar scarring consistent with postoperative change are again seen. Lungs are otherwise clear. No pneumothorax or pleural effusion. Heart size is normal. No acute bony abnormality. IMPRESSION: No acute disease.  Stable compared to prior exams. Electronically Signed   By: Drusilla Kanner M.D.   On: 12/21/2016 17:22    My personal review of EKG: Rhythm NSR, multiple PACs   Assessment & Plan:    Active Problems:   Chest pain   1. Chest pain- cardiac enzymes 2 have been negative in the ED. Will place under observation and cycle cardiac enzymes. Chest pain has almost resolved  at this time. He also seems to have some muscular skeletal component. Pain is reproducible on palpation of left chest wall 2. Palpitations- EKG shows multiple PACs. Will check magnesium level. Monitor on telemetry. We'll consult cardiology in a.m. For further recommendations. 3. Diabetes mellitus- hold metformin, start sliding scale insulin with NovoLog. 4. History of pulmonary embolism- recent diagnosis of PE, after the knee surgery on 09/23/2016.continue anticoagulation with eliquis 5. Hypertension- blood pressure is stable, continue Cardizem, benazepril. 6. Hyperlipidemia- continue Lipitor   DVT Prophylaxis-  apixaban  AM Labs Ordered, also please review Full Orders  Family Communication: Admission, patients condition and plan of care including tests being ordered have been discussed with the patient  who indicate understanding and agree with the plan and Code Status.  Code Status:  Full code  Admission status: *observation  Time spent in minutes :60 minutes   Katrece Roediger S M.D on 12/21/2016 at 10:41 PM  Between 7am to 7pm - Pager - [phone]. After 7pm go to [email or web address] - password [email or web address]  [facility] - Office  [phone]

## 2016-12-21 NOTE — ED Notes (Signed)
Pt continues to c/o left shoulder tightness.  Denies any palpitations.

## 2016-12-21 NOTE — ED Provider Notes (Signed)
[facility] EMERGENCY DEPARTMENT Provider Note   CSN: [id number] Arrival date & time: 12/21/16  1645     History   Chief Complaint Chief Complaint  Patient presents with  . Chest Pain    HPI Brian Roach is a 56 y.o. male.  Second visit to the emergency department in the past 5 days for chest pain.  Patient is currently on Eliquis secondary to a pulmonary embolism after knee surgery on 09/23/2016.  CT angiogram of the chest on 12/18/16 reveals no obvious PE.  Pain is described as a tightness with radiation to the left shoulder with a sensation of palpitations.  Nothing makes symptoms better or worse.  Severity is moderate.  No obvious dyspnea, diaphoresis, nausea.      Past Medical History:  Diagnosis Date  . Diabetes mellitus without complication (HCC)   . Hyperlipidemia   . Hypertension   . PE (pulmonary embolism)     Patient Active Problem List   Diagnosis Date Noted  . Acute pulmonary embolism (HCC) 09/28/2016  . Quadriceps muscle rupture, left, initial encounter 09/23/2016  . Knee injury 09/22/2016  . Diabetes mellitus without complication (HCC) 06/17/2016  . DM (diabetes mellitus), type 2, uncontrolled (HCC) 11/17/2015  . Sciatica, right side 11/17/2015  . Allergic rhinitis 02/19/2015  . Irritable bowel syndrome 02/19/2015  . Left shoulder pain 05/27/2012  . History of pulmonary embolism 05/27/2012  . Obesity 05/27/2012  . HTN (hypertension), benign 05/27/2012  . Hypercholesteremia 05/27/2012    Past Surgical History:  Procedure Laterality Date  . ANTERIOR CRUCIATE LIGAMENT REPAIR Right   . BACK SURGERY    . KNEE SURGERY    . LUNG SURGERY     Unspecified but was done for PE. Had chest tubes. Approx 2003  . QUADRICEPS TENDON REPAIR Left 09/23/2016   Procedure: REPAIR QUADRICEP TENDON;  Surgeon: Vickki Hearing, MD;  Location: [facility];  Service: Orthopedics;  Laterality: Left;  . WISDOM TOOTH EXTRACTION         Home Medications    Prior to  Admission medications   Medication Sig Start Date End Date Taking? Authorizing Provider  apixaban (ELIQUIS) 5 MG TABS tablet Take 1 tablet (5 mg total) by mouth 2 (two) times daily. 10/06/16  Yes Kari Baars, MD  atorvastatin (LIPITOR) 20 MG tablet TAKE 1 TABLET EVERY EVENING 07/19/16  Yes Stacks, Broadus John, MD  benazepril (LOTENSIN) 20 MG tablet TAKE 1 TABLET BY MOUTH EVERY DAY 12/08/16  Yes [name], MD  cetirizine (ZYRTEC) 10 MG tablet Take 1 tablet (10 mg total) by mouth daily. For allergy symptoms 12/17/16  Yes Stacks, Broadus John, MD  diltiazem (CARDIZEM CD) 300 MG 24 hr capsule TAKE 1 CAPSULE (300 MG TOTAL) BY MOUTH DAILY. 08/11/16  Yes Stacks, Broadus John, MD  docusate sodium (COLACE) 100 MG capsule Take 200 mg by mouth 2 (two) times daily.   Yes [provider]  metFORMIN (GLUCOPHAGE-XR) 500 MG 24 hr tablet TAKE 1 TABLET (500 MG TOTAL) BY MOUTH DAILY WITH BREAKFAST. 03/19/16  Yes Stacks, Broadus John, MD  Multiple Vitamins-Minerals (ONE-A-DAY MENS HEALTH FORMULA PO) Take 1 tablet by mouth daily.   Yes [provider]  omeprazole (PRILOSEC) 20 MG capsule Take 1 capsule (20 mg total) by mouth daily. 12/18/16  Yes Rancour, Jeannett Senior, MD    Family History Family History  Problem Relation Age of Onset  . Heart disease Father     Social History Social History  Substance Use Topics  . Smoking status: Never Smoker  .  Smokeless tobacco: Never Used  . Alcohol use No     Allergies   Patient has no known allergies.   Review of Systems Review of Systems  All other systems reviewed and are negative.    Physical Exam Updated Vital Signs BP 129/76   Pulse 82   Temp 97.6 F (36.4 C) (Oral)   Resp 16   Ht 5\' 10"  (1.778 m)   Wt 117.5 kg (259 lb)   SpO2 100%   BMI 37.16 kg/m   Physical Exam  Constitutional: He is oriented to person, place, and time. He appears well-developed and well-nourished.  HENT:  Head: Normocephalic and atraumatic.  Eyes: Conjunctivae are normal.    Neck: Neck supple.  Cardiovascular: Normal rate.   irregular  Pulmonary/Chest: Effort normal and breath sounds normal.  Abdominal: Soft. Bowel sounds are normal.  Musculoskeletal: Normal range of motion.  Neurological: He is alert and oriented to person, place, and time.  Skin: Skin is warm and dry.  Psychiatric: He has a normal mood and affect. His behavior is normal.  Nursing note and vitals reviewed.    ED Treatments / Results  Labs (all labs ordered are listed, but only abnormal results are displayed) Labs Reviewed  BASIC METABOLIC PANEL - Abnormal; Notable for the following:       Result Value   Glucose, Bld 115 (*)    All other components within normal limits  CBC - Abnormal; Notable for the following:    WBC 12.0 (*)    All other components within normal limits  TROPONIN I  TROPONIN I    EKG  EKG Interpretation  Date/Time:  Tuesday December 21 2016 16:51:09 EDT Ventricular Rate:  90 PR Interval:  166 QRS Duration: 86 QT Interval:  344 QTC Calculation: 420 R Axis:   28 Text Interpretation:  Sinus rhythm with Premature supraventricular complexes Otherwise normal ECG Confirmed by Donnetta Hutchingook, Bemnet Trovato ([id number]) on 12/21/2016 5:50:10 PM       Radiology Dg Chest 2 View  Result Date: 12/21/2016 CLINICAL DATA:  Chest pain and tightness beginning last night. EXAM: CHEST  2 VIEW COMPARISON:  CT chest and PA and lateral chest 12/17/2016. PA and lateral chest 05/27/2012. FINDINGS: Volume loss in the right chest and mild right basilar scarring consistent with postoperative change are again seen. Lungs are otherwise clear. No pneumothorax or pleural effusion. Heart size is normal. No acute bony abnormality. IMPRESSION: No acute disease.  Stable compared to prior exams. Electronically Signed   By: Drusilla Kannerhomas  Dalessio M.D.   On: 12/21/2016 17:22    Procedures Procedures (including critical care time)  Medications Ordered in ED Medications - No data to display   Initial Impression /  Assessment and Plan / ED Course  I have reviewed the triage vital signs and the nursing notes.  Pertinent labs & imaging results that were available during my care of the patient were reviewed by me and considered in my medical decision making (see chart for details).     Second visit to the emergency department in the past 5 days for chest pain.  EKG shows normal sinus rhythm with premature supraventricular complexes.  Troponin negative x2.  Discussed with Dr. Sharl MaLama.  Admit for observation.  Final Clinical Impressions(s) / ED Diagnoses   Final diagnoses:  Chest pain, unspecified type  Palpitations    New Prescriptions New Prescriptions   No medications on file     Donnetta Hutchingook, Lennis Rader, MD 12/21/16 2125

## 2016-12-22 ENCOUNTER — Encounter (HOSPITAL_COMMUNITY): Payer: Self-pay

## 2016-12-22 ENCOUNTER — Observation Stay (HOSPITAL_BASED_OUTPATIENT_CLINIC_OR_DEPARTMENT_OTHER): Payer: BC Managed Care – PPO

## 2016-12-22 DIAGNOSIS — E7800 Pure hypercholesterolemia, unspecified: Secondary | ICD-10-CM

## 2016-12-22 DIAGNOSIS — I471 Supraventricular tachycardia: Secondary | ICD-10-CM

## 2016-12-22 DIAGNOSIS — I10 Essential (primary) hypertension: Secondary | ICD-10-CM | POA: Diagnosis not present

## 2016-12-22 DIAGNOSIS — Z86711 Personal history of pulmonary embolism: Secondary | ICD-10-CM | POA: Diagnosis not present

## 2016-12-22 DIAGNOSIS — R079 Chest pain, unspecified: Secondary | ICD-10-CM

## 2016-12-22 DIAGNOSIS — R002 Palpitations: Secondary | ICD-10-CM | POA: Diagnosis not present

## 2016-12-22 DIAGNOSIS — M25512 Pain in left shoulder: Secondary | ICD-10-CM

## 2016-12-22 LAB — GLUCOSE, CAPILLARY
GLUCOSE-CAPILLARY: 114 mg/dL — AB (ref 65–99)
Glucose-Capillary: 109 mg/dL — ABNORMAL HIGH (ref 65–99)
Glucose-Capillary: 118 mg/dL — ABNORMAL HIGH (ref 65–99)
Glucose-Capillary: 150 mg/dL — ABNORMAL HIGH (ref 65–99)

## 2016-12-22 LAB — TROPONIN I

## 2016-12-22 LAB — COMPREHENSIVE METABOLIC PANEL
ALT: 39 U/L (ref 17–63)
AST: 25 U/L (ref 15–41)
Albumin: 4.2 g/dL (ref 3.5–5.0)
Alkaline Phosphatase: 58 U/L (ref 38–126)
Anion gap: 6 (ref 5–15)
BUN: 15 mg/dL (ref 6–20)
CHLORIDE: 102 mmol/L (ref 101–111)
CO2: 32 mmol/L (ref 22–32)
CREATININE: 1 mg/dL (ref 0.61–1.24)
Calcium: 9.4 mg/dL (ref 8.9–10.3)
Glucose, Bld: 115 mg/dL — ABNORMAL HIGH (ref 65–99)
POTASSIUM: 4.7 mmol/L (ref 3.5–5.1)
SODIUM: 140 mmol/L (ref 135–145)
Total Bilirubin: 0.8 mg/dL (ref 0.3–1.2)
Total Protein: 7 g/dL (ref 6.5–8.1)

## 2016-12-22 LAB — NM MYOCAR MULTI W/SPECT W/WALL MOTION / EF
CHL CUP NUCLEAR SRS: 2
CHL CUP NUCLEAR SSS: 5
CHL CUP RESTING HR STRESS: 76 {beats}/min
LHR: 0.35
LV dias vol: 106 mL (ref 62–150)
LV sys vol: 35 mL
NUC STRESS TID: 1.1
Peak HR: 117 {beats}/min
SDS: 3

## 2016-12-22 LAB — MAGNESIUM: MAGNESIUM: 2.7 mg/dL — AB (ref 1.7–2.4)

## 2016-12-22 LAB — CBC
HCT: 45.7 % (ref 39.0–52.0)
Hemoglobin: 15.2 g/dL (ref 13.0–17.0)
MCH: 30.5 pg (ref 26.0–34.0)
MCHC: 33.3 g/dL (ref 30.0–36.0)
MCV: 91.6 fL (ref 78.0–100.0)
PLATELETS: 247 10*3/uL (ref 150–400)
RBC: 4.99 MIL/uL (ref 4.22–5.81)
RDW: 13.6 % (ref 11.5–15.5)
WBC: 10.3 10*3/uL (ref 4.0–10.5)

## 2016-12-22 MED ORDER — DILTIAZEM HCL ER COATED BEADS 180 MG PO CP24
300.0000 mg | ORAL_CAPSULE | Freq: Every day | ORAL | Status: DC
  Filled 2016-12-22: qty 1

## 2016-12-22 MED ORDER — APIXABAN 5 MG PO TABS
5.0000 mg | ORAL_TABLET | Freq: Two times a day (BID) | ORAL | Status: DC
  Administered 2016-12-22 – 2016-12-23 (×4): 5 mg via ORAL
  Filled 2016-12-22 (×4): qty 1

## 2016-12-22 MED ORDER — BENAZEPRIL HCL 10 MG PO TABS
20.0000 mg | ORAL_TABLET | Freq: Every day | ORAL | Status: DC
  Filled 2016-12-22: qty 2

## 2016-12-22 MED ORDER — DILTIAZEM HCL ER COATED BEADS 180 MG PO CP24
360.0000 mg | ORAL_CAPSULE | Freq: Every day | ORAL | Status: DC
  Administered 2016-12-22 – 2016-12-23 (×2): 360 mg via ORAL
  Filled 2016-12-22 (×2): qty 2

## 2016-12-22 MED ORDER — INSULIN ASPART 100 UNIT/ML ~~LOC~~ SOLN
0.0000 [IU] | Freq: Three times a day (TID) | SUBCUTANEOUS | Status: DC
  Administered 2016-12-22: 1 [IU] via SUBCUTANEOUS

## 2016-12-22 MED ORDER — REGADENOSON 0.4 MG/5ML IV SOLN
0.4000 mg | Freq: Once | INTRAVENOUS | Status: AC
  Administered 2016-12-22: 0.4 mg via INTRAVENOUS
  Filled 2016-12-22: qty 5

## 2016-12-22 MED ORDER — DILTIAZEM HCL ER COATED BEADS 180 MG PO CP24: 360.0000 mg | ORAL_CAPSULE | Freq: Every day | ORAL | Status: DC

## 2016-12-22 MED ORDER — ONDANSETRON HCL 4 MG/2ML IJ SOLN: 4.0000 mg | Freq: Four times a day (QID) | INTRAMUSCULAR | Status: DC | PRN

## 2016-12-22 MED ORDER — TECHNETIUM TC 99M TETROFOSMIN IV KIT
30.0000 | PACK | Freq: Once | INTRAVENOUS | Status: AC | PRN
  Administered 2016-12-22: 30 via INTRAVENOUS

## 2016-12-22 MED ORDER — SODIUM CHLORIDE 0.9% FLUSH
INTRAVENOUS | Status: AC
  Administered 2016-12-22: 10 mL via INTRAVENOUS
  Filled 2016-12-22: qty 10

## 2016-12-22 MED ORDER — BENAZEPRIL HCL 10 MG PO TABS: 10.0000 mg | ORAL_TABLET | Freq: Every day | ORAL | Status: DC

## 2016-12-22 MED ORDER — REGADENOSON 0.4 MG/5ML IV SOLN
INTRAVENOUS | Status: AC
  Administered 2016-12-22: 0.4 mg via INTRAVENOUS
  Filled 2016-12-22: qty 5

## 2016-12-22 MED ORDER — SODIUM CHLORIDE 0.9 % IV SOLN: INTRAVENOUS | Status: DC

## 2016-12-22 MED ORDER — ONDANSETRON HCL 4 MG PO TABS: 4.0000 mg | ORAL_TABLET | Freq: Four times a day (QID) | ORAL | Status: DC | PRN

## 2016-12-22 MED ORDER — BENAZEPRIL HCL 10 MG PO TABS
10.0000 mg | ORAL_TABLET | Freq: Every day | ORAL | Status: DC
  Administered 2016-12-22 – 2016-12-23 (×2): 10 mg via ORAL
  Filled 2016-12-22: qty 1

## 2016-12-22 MED ORDER — TECHNETIUM TC 99M TETROFOSMIN IV KIT
10.0000 | PACK | Freq: Once | INTRAVENOUS | Status: AC | PRN
  Administered 2016-12-22: 10 via INTRAVENOUS

## 2016-12-22 MED ORDER — LORATADINE 10 MG PO TABS
10.0000 mg | ORAL_TABLET | Freq: Every day | ORAL | Status: DC
  Administered 2016-12-22 – 2016-12-23 (×2): 10 mg via ORAL
  Filled 2016-12-22 (×2): qty 1

## 2016-12-22 MED ORDER — ATORVASTATIN CALCIUM 20 MG PO TABS
20.0000 mg | ORAL_TABLET | Freq: Every evening | ORAL | Status: DC
  Administered 2016-12-22: 20 mg via ORAL
  Filled 2016-12-22: qty 1

## 2016-12-22 MED ORDER — PANTOPRAZOLE SODIUM 40 MG PO TBEC
40.0000 mg | DELAYED_RELEASE_TABLET | Freq: Every day | ORAL | Status: DC
  Administered 2016-12-22 – 2016-12-23 (×2): 40 mg via ORAL
  Filled 2016-12-22 (×2): qty 1

## 2016-12-22 NOTE — Progress Notes (Signed)
Patient is going off unit for stress test. Cardiac monitor removed and placed on stand by until he returns

## 2016-12-22 NOTE — Progress Notes (Signed)
PROGRESS NOTE  Brian Roach YSA:[id number] DOB: 03-05-1960 DOA: 12/21/2016 PCP: [name], MD  Brief History:  56 year old male with a history of hypertension, hyperlipidemia, and pulmonary embolus (diagnosed September 24, 2016 after knee surgery) presented with 5-day history of left axillary discomfort intermittently radiating down the left arm.  He states that this has occurred usually at rest.  It has not been exacerbated or elicited by activity.  He denies any recent dizziness, nausea, vomiting, fevers, chills.  He states that he has been able to climb stairs and walk around the block without any chest discomfort.  However on the afternoon of December 21, 2016, the patient felt palpitations and "spasm" in his chest.  He felt his pulse and thought it was fast.  As result, the patient presented emergency department for further evaluation.  Troponins have been negative x3.  Chest x-ray was negative.  EKG showed sinus rhythm with nonspecific T wave changes and PACs.  CT angiogram chest on December 18, 2016 was negative for any acute emboli or dissection.  There was no airspace disease.  Assessment/Plan: Atypical chest discomfort -Troponins negative x3 -Consult cardiology -CT angiogram chest negative for pulmonary embolus or dissection -Personally reviewed EKG--sinus rhythm, nonspecific T wave changes -Personally reviewed chest x-ray--no infiltrates or edema  Pulmonary embolus -Diagnosed September 24, 2016 after knee surgery -Continue apixaban  Essential hypertension -Continue benazepril and diltiazem  Diabetes mellitus type 2 -Holding metformin -Continue NovoLog sliding scale  Hyperlipidemia -Continue statin  Active Problems:   Chest pain      Disposition Plan:   Home when cleared by cardiology Family Communication:  No Family at bedside  Consultants:  cardiology  Code Status:  FULL   DVT Prophylaxis:  apixaban   Procedures: As Listed in Progress Note  Above  Antibiotics: None    Subjective: Patient denies fevers, chills, headache, chest pain, dyspnea, nausea, vomiting, diarrhea, abdominal pain, dysuria, hematuria, hematochezia, and melena.   Objective: Vitals:   12/21/16 2200 12/22/16 0024 12/22/16 0032 12/22/16 0508  BP: 131/79 132/85  128/87  Pulse: 67 65  75  Resp: 13   16  Temp:  (!) 97.4 F (36.3 C)  (!) 97.4 F (36.3 C)  TempSrc:  Oral  Oral  SpO2: 95% 98%  97%  Weight:   115.8 kg (255 lb 3.2 oz)   Height:       No intake or output data in the 24 hours ending 12/22/16 0724 Weight change:  Exam:   General:  Pt is alert, follows commands appropriately, not in acute distress  HEENT: No icterus, No thrush, No neck mass, Zeb/AT  Cardiovascular: RRR, S1/S2, no rubs, no gallops  Respiratory: CTA bilaterally, no wheezing, no crackles, no rhonchi  Abdomen: Soft/+BS, non tender, non distended, no guarding  Extremities: No edema, No lymphangitis, No petechiae, No rashes, no synovitis   Data Reviewed: I have personally reviewed following labs and imaging studies Basic Metabolic Panel:  Recent Labs Lab 12/17/16 2032 12/21/16 1717 12/22/16 0031 12/22/16 0604  NA 138 141  --  140  K 4.0 4.3  --  4.7  CL 101 106  --  102  CO2 25 25  --  32  GLUCOSE 114* 115*  --  115*  BUN 18 13  --  15  CREATININE 1.16 0.94  --  1.00  CALCIUM 9.7 9.8  --  9.4  MG  --   --  2.7*  --  Liver Function Tests:  Recent Labs Lab 12/22/16 0604  AST 25  ALT 39  ALKPHOS 58  BILITOT 0.8  PROT 7.0  ALBUMIN 4.2   No results for input(s): LIPASE, AMYLASE in the last 168 hours. No results for input(s): AMMONIA in the last 168 hours. Coagulation Profile: No results for input(s): INR, PROTIME in the last 168 hours. CBC:  Recent Labs Lab 12/17/16 2032 12/21/16 1717 12/22/16 0604  WBC 15.3* 12.0* 10.3  HGB 16.4 16.5 15.2  HCT 48.3 48.2 45.7  MCV 91.1 90.1 91.6  PLT 277 281 247   Cardiac Enzymes:  Recent Labs Lab  12/18/16 0002 12/21/16 1717 12/21/16 2008 12/22/16 0031 12/22/16 0604  TROPONINI <0.03 <0.03 <0.03 <0.03 <0.03   BNP: Invalid input(s): POCBNP CBG: No results for input(s): GLUCAP in the last 168 hours. HbA1C: No results for input(s): HGBA1C in the last 72 hours. Urine analysis:    Component Value Date/Time   APPEARANCEUR Clear 03/02/2016 1026   GLUCOSEU Negative 03/02/2016 1026   BILIRUBINUR Negative 03/02/2016 1026   PROTEINUR Negative 03/02/2016 1026   NITRITE Negative 03/02/2016 1026   LEUKOCYTESUR Trace (A) 03/02/2016 1026   Sepsis Labs: @LABRCNTIP (procalcitonin:4,lacticidven:4) )No results found for this or any previous visit (from the past 240 hour(s)).   Scheduled Meds: . apixaban  5 mg Oral BID  . atorvastatin  20 mg Oral QPM  . benazepril  20 mg Oral Daily  . diltiazem  300 mg Oral Daily  . insulin aspart  0-9 Units Subcutaneous TID WC  . loratadine  10 mg Oral Daily  . pantoprazole  40 mg Oral Daily   Continuous Infusions: . sodium chloride      Procedures/Studies: Dg Chest 2 View  Result Date: 12/21/2016 CLINICAL DATA:  Chest pain and tightness beginning last night. EXAM: CHEST  2 VIEW COMPARISON:  CT chest and PA and lateral chest 12/17/2016. PA and lateral chest 05/27/2012. FINDINGS: Volume loss in the right chest and mild right basilar scarring consistent with postoperative change are again seen. Lungs are otherwise clear. No pneumothorax or pleural effusion. Heart size is normal. No acute bony abnormality. IMPRESSION: No acute disease.  Stable compared to prior exams. Electronically Signed   By: Drusilla Kanner M.D.   On: 12/21/2016 17:22   Dg Chest 2 View  Result Date: 12/17/2016 CLINICAL DATA:  Chest pain.  History of pulmonary embolism. EXAM: CHEST  2 VIEW COMPARISON:  09/24/2016 FINDINGS: Lower thoracic spondylosis. Lateral view degraded by patient arm position. Moderate right hemidiaphragm elevation is similar. Midline trachea. Normal heart  size. No pleural effusion or pneumothorax. Slight increase in probable scarring at the right lung base laterally. Clear left lung. IMPRESSION: No acute cardiopulmonary disease. Moderate right hemidiaphragm elevation with progressive right lung base scarring. Electronically Signed   By: Jeronimo Greaves M.D.   On: 12/17/2016 21:21   Ct Angio Chest Pe W And/or Wo Contrast  Result Date: 12/18/2016 CLINICAL DATA:  Chest tightness for 2 days. History of blood clots in the lungs. Status post knee surgery. EXAM: CT ANGIOGRAPHY CHEST WITH CONTRAST TECHNIQUE: Multidetector CT imaging of the chest was performed using the standard protocol during bolus administration of intravenous contrast. Multiplanar CT image reconstructions and MIPs were obtained to evaluate the vascular anatomy. CONTRAST:  100 mL Isovue 370 COMPARISON:  09/24/2016 FINDINGS: Cardiovascular: Good opacification of the central and segmental pulmonary artery's. No residual filling defects are identified. This represents interval resolution of previous pulmonary emboli. No evidence of acute pulmonary embolus today.  Normal caliber thoracic aorta. No aortic dissection. Great vessel origins are patent. Minimal vascular calcifications in the aorta. Normal heart size. No pericardial effusion. Mediastinum/Nodes: No significant lymphadenopathy. Esophagus is mostly decompressed. Lungs/Pleura: Motion artifact limits evaluation of the lungs. Mild dependent changes. No airspace disease or consolidation. No pleural effusions. No pneumothorax. Prominent pleural fat. Upper Abdomen: No acute process demonstrated in the visualized upper abdomen. Musculoskeletal: Degenerative changes throughout the spine. No destructive bone lesions. Review of the MIP images confirms the above findings. IMPRESSION: 1. No evidence of significant pulmonary embolus, indicating interval resolution of previous pulmonary emboli. 2. No evidence of active pulmonary disease. Electronically Signed   By:  Burman Nieves M.D.   On: 12/18/2016 00:52    Kensie Susman, DO  [facility] Pager [phone]  If 7PM-7AM, please contact night-coverage [email or web address] Password [email or web address] 12/22/2016, 7:24 AM   LOS: 0 days

## 2016-12-22 NOTE — Consult Note (Signed)
Cardiology Consultation:   Patient ID: Brian Roach; [id number]; 08/16/1960   Admit date: 12/21/2016 Date of Consult: 12/22/2016  Primary Care Provider: Mechele ClaudeStacks, [place], Roach Primary Cardiologist: Brian Roach 2014 Primary Electrophysiologist:  NA   Patient Profile:   Brian Roach is a 56 y.o. male with a hx of pulmonary embolus who is being seen today for the evaluation of Chest pain at the request of Brian Roach.  History of Present Illness:   Brian Roach is a 56 year old male patient who was seen by Brian Roach in 2014 with atypical chest pain and had a negative nuclear stress test. He had pulmonary embolism 08/2016 after knee surgery and is on Eliquis. Also has a history of hypertension, HLD, DM type II and family history of CAD. Had a PE many years ago after knee surgery requiring surgery.  He complains of left shoulder pain off/on for a couple of weeks. Came to ED 12/17/16 and troponins negative, CT negative for PE and he was advised to see us in f/u scheduled for next week.   Brian NeedlesYest he developed rapid heart fluttering that took his breath away and lasted about 5 min. He never had this before. Still having left shoulder tightness in the hospital off/on but not associated with palpitations. Had ST at 125/m with 6 beat run SVT this am asymptomatic. Inactive since knee surgery as he's not allowed to do much. Father died of CHF at 4265 but was heavy smoker and drinker.  Troponins negative 4. EKG normal sinus rhythm with PACs and poor R-wave progression anteriorly unchanged from prior tracings  Past Medical History:  Diagnosis Date  . Diabetes mellitus without complication (HCC)   . Hyperlipidemia   . Hypertension   . PE (pulmonary embolism)     Past Surgical History:  Procedure Laterality Date  . ANTERIOR CRUCIATE LIGAMENT REPAIR Right   . BACK SURGERY    . KNEE SURGERY    . LUNG SURGERY     Unspecified but was done for PE. Had chest tubes. Approx 2003  . QUADRICEPS TENDON  REPAIR Left 09/23/2016   Procedure: REPAIR QUADRICEP TENDON;  Surgeon: Brian Roach;  Location: [facility];  Service: Orthopedics;  Laterality: Left;  . WISDOM TOOTH EXTRACTION       Home Medications:  Prior to Admission medications   Medication Sig Start Date End Date Taking? Authorizing Provider  apixaban (ELIQUIS) 5 MG TABS tablet Take 1 tablet (5 mg total) by mouth 2 (two) times daily. 10/06/16  Yes Brian Roach, Edward, Roach  atorvastatin (LIPITOR) 20 MG tablet TAKE 1 TABLET EVERY EVENING 07/19/16  Yes Brian Roach  benazepril (LOTENSIN) 20 MG tablet TAKE 1 TABLET BY MOUTH EVERY DAY 12/08/16  Yes Brian Roach  cetirizine (ZYRTEC) 10 MG tablet Take 1 tablet (10 mg total) by mouth daily. For allergy symptoms 12/17/16  Yes Brian Roach  diltiazem (CARDIZEM CD) 300 MG 24 hr capsule TAKE 1 CAPSULE (300 MG TOTAL) BY MOUTH DAILY. 08/11/16  Yes Brian Roach  docusate sodium (COLACE) 100 MG capsule Take 200 mg by mouth 2 (two) times daily.   Yes Provider, Historical, Roach  metFORMIN (GLUCOPHAGE-XR) 500 MG 24 hr tablet TAKE 1 TABLET (500 MG TOTAL) BY MOUTH DAILY WITH BREAKFAST. 03/19/16  Yes Brian Roach  Multiple Vitamins-Minerals (ONE-A-DAY MENS HEALTH FORMULA PO) Take 1 tablet by mouth daily.   Yes Provider, Historical, Roach  omeprazole (PRILOSEC) 20 MG capsule Take 1 capsule (20 mg total) by mouth  daily. 12/18/16  Yes Brian Roach    Inpatient Medications: Scheduled Meds: . apixaban  5 mg Oral BID  . atorvastatin  20 mg Oral QPM  . benazepril  20 mg Oral Daily  . diltiazem  300 mg Oral Daily  . insulin aspart  0-9 Units Subcutaneous TID WC  . loratadine  10 mg Oral Daily  . pantoprazole  40 mg Oral Daily   Continuous Infusions: . sodium chloride     PRN Meds: ondansetron **OR** ondansetron (ZOFRAN) IV  Allergies:   No Known Allergies  Social History:   Social History   Social History  . Marital status: Divorced    Spouse name: N/A  . Number of  children: N/A  . Years of education: N/A   Occupational History  . Not on file.   Social History Main Topics  . Smoking status: Never Smoker  . Smokeless tobacco: Never Used  . Alcohol use No  . Drug use: No  . Sexual activity: Yes   Other Topics Concern  . Not on file   Social History Narrative  . No narrative on file    Family History:    Family History  Problem Relation Age of Onset  . Heart disease Father      ROS:  Please see the history of present illness.  Review of Systems  Constitution: Negative.  HENT: Negative.   Cardiovascular: Positive for chest pain.  Respiratory: Negative.   Endocrine: Negative.   Hematologic/Lymphatic: Negative.   Musculoskeletal: Positive for joint pain, joint swelling, myalgias and neck pain.  Gastrointestinal: Negative.   Genitourinary: Negative.   Neurological: Negative.      All other ROS reviewed and negative.     Physical Exam/Data:   Vitals:   12/21/16 2200 12/22/16 0024 12/22/16 0032 12/22/16 0508  BP: 131/79 132/85  128/87  Pulse: 67 65  75  Resp: 13   16  Temp:  (!) 97.4 F (36.3 C)  (!) 97.4 F (36.3 C)  TempSrc:  Oral  Oral  SpO2: 95% 98%  97%  Weight:   255 lb 3.2 oz (115.8 kg)   Height:       No intake or output data in the 24 hours ending 12/22/16 0756 Filed Weights   12/21/16 1650 12/22/16 0032  Weight: 259 lb (117.5 kg) 255 lb 3.2 oz (115.8 kg)   Body mass index is 36.62 kg/m.  General: Obese, in no acute distress  HEENT: normal Lymph: no adenopathy Neck: no JVD Endocrine:  No thryomegaly Vascular: No carotid bruits; FA pulses 2+ bilaterally without bruits  Cardiac:  normal S1, S2; RRR; no murmur  Lungs:  clear to auscultation bilaterally, no wheezing, rhonchi or rales  Abd: soft, nontender, no hepatomegaly  Ext: no edema Musculoskeletal:  No deformities, BUE and BLE strength normal and equal Skin: warm and dry  Neuro:  CNs 2-12 intact, no focal abnormalities noted Psych:  Normal affect    EKG:  The EKG was personally reviewed and demonstrates:    Telemetry:  Telemetry was personally reviewed and normal sinus rhythm with PACs and poor R-wave progression anteriorly unchanged from prior tracings  demonstrates:  NSR, ST at 125 6 beats SVT  Relevant CV Studies:   Echo September 26, 2016 Study Conclusions   - Left ventricle: The cavity size was normal. Wall thickness was   increased in a pattern of mild LVH. Systolic function was   vigorous. The estimated ejection fraction was in the range of 65%  to 70%. Wall motion was normal; there were no regional wall   motion abnormalities. Left ventricular diastolic function   parameters were normal for the patient&'s age. - Right ventricle: Systolic function was normal. - Right atrium: Central venous pressure (est): 3 mm Hg. - Tricuspid valve: There was trivial regurgitation. - Pulmonary arteries: Systolic pressure could not be accurately   estimated. - Pericardium, extracardiac: A prominent pericardial fat pad was   present.   Impressions:   - Mild LVH with LVEF 65-70%. Grossly normal diastolic function.   Normal right ventricular contraction. Trivial tricuspid   regurgitation.   nuclear stress 2014   Estimated ejection fraction was 61%.   IMPRESSION: Negative pharmacologic stress nuclear myocardial study.     Laboratory Data:  Chemistry Recent Labs Lab 12/17/16 2032 12/21/16 1717 12/22/16 0604  NA 138 141 140  K 4.0 4.3 4.7  CL 101 106 102  CO2 25 25 32  GLUCOSE 114* 115* 115*  BUN 18 13 15   CREATININE 1.16 0.94 1.00  CALCIUM 9.7 9.8 9.4  GFRNONAA >60 >60 >60  GFRAA >60 >60 >60  ANIONGAP 12 10 6      Recent Labs Lab 12/22/16 0604  PROT 7.0  ALBUMIN 4.2  AST 25  ALT 39  ALKPHOS 58  BILITOT 0.8   Hematology Recent Labs Lab 12/17/16 2032 12/21/16 1717 12/22/16 0604  WBC 15.3* 12.0* 10.3  RBC 5.30 5.35 4.99  HGB 16.4 16.5 15.2  HCT 48.3 48.2 45.7  MCV 91.1 90.1 91.6  MCH 30.9 30.8 30.5   MCHC 34.0 34.2 33.3  RDW 13.4 13.3 13.6  PLT 277 281 247   Cardiac Enzymes Recent Labs Lab 12/17/16 2032 12/18/16 0002 12/21/16 1717 12/21/16 2008 12/22/16 0031 12/22/16 0604  TROPONINI <0.03 <0.03 <0.03 <0.03 <0.03 <0.03   No results for input(s): TROPIPOC in the last 168 hours.  BNPNo results for input(s): BNP, PROBNP in the last 168 hours.  DDimer No results for input(s): DDIMER in the last 168 hours.  Radiology/Studies:  Dg Chest 2 View  Result Date: 12/21/2016 CLINICAL DATA:  Chest pain and tightness beginning last night. EXAM: CHEST  2 VIEW COMPARISON:  CT chest and PA and lateral chest 12/17/2016. PA and lateral chest 05/27/2012. FINDINGS: Volume loss in the right chest and mild right basilar scarring consistent with postoperative change are again seen. Lungs are otherwise clear. No pneumothorax or pleural effusion. Heart size is normal. No acute bony abnormality. IMPRESSION: No acute disease.  Stable compared to prior exams. Electronically Signed   By: Drusilla Kanner M.D.   On: 12/21/2016 17:22    Assessment and Plan:    Left shoulder pain- troponins negative 4, EKG is without acute change.recomend lexiscan myoview.  History of pulmonary embolism 08/2016 after knee surgery on Eliquis. Previous PE years ago after knee surgery. Recommend heme consult as outpatient  SVT-new for patient cardizem 300 mg daily ordered today  Hypertension on lotensin   HLD on lipitor   Family history of CAD     For questions or updates, please contact [facility] Please consult [email or web address] for contact info under Cardiology/STEMI.   Signed, Jacolyn Reedy, PA-C  12/22/2016 7:56 AM   The patient was seen and examined, and I agree with the history, physical exam, assessment and plan as documented above, with modifications as noted below. I have also personally reviewed all relevant documentation, old records, labs, and both radiographic and cardiovascular studies. I have also  independently interpreted old and new ECG's.  56  yr old male with aforementioned history of pulmonary embolism after left knee surgery in July 2018, now anticoagulated with Eliquis. Has a h/o right leg DVT and PE after right knee surgery several years ago which required surgery.  He was admitted with left sided chest pain associated with shortness of breath which occurred yesterday and lasted at least 2 minutes. He felt like his heart was racing which was new for him. He said this was different than his left shoulder and infraaxillary discomfort which has been intermittent and not associated with exertion for the past week or so.  ECG shows sinus rhythm with PAC's.  Telemetry shows sinus tachycardia with brief paroxysms of SVT.  Troponins normal.  Chest xray unremarkable.  Echocardiogram reviewed above and showed vigorous LV systolic function, normal diastolic function, and normal regional wall motion with mild LVH.  Nuclear stress test in 2014 was normal.  Recommendations: In order to suppress further paroxysms of SVT, I will increase long-acting diltiazem to 360 mg daily. I will reduce benazepril to 10 mg daily so as not to precipitate hypotension.  I will proceed with a nuclear myocardial perfusion imaging study to evaluate for ischemic heart disease (Lexiscan Myoview).   Prentice Docker, Roach, [facility]  12/22/2016 10:15 AM

## 2016-12-23 DIAGNOSIS — Z86711 Personal history of pulmonary embolism: Secondary | ICD-10-CM | POA: Diagnosis not present

## 2016-12-23 DIAGNOSIS — M25512 Pain in left shoulder: Secondary | ICD-10-CM | POA: Diagnosis not present

## 2016-12-23 DIAGNOSIS — R002 Palpitations: Secondary | ICD-10-CM

## 2016-12-23 DIAGNOSIS — E7800 Pure hypercholesterolemia, unspecified: Secondary | ICD-10-CM | POA: Diagnosis not present

## 2016-12-23 DIAGNOSIS — I471 Supraventricular tachycardia: Secondary | ICD-10-CM

## 2016-12-23 DIAGNOSIS — I10 Essential (primary) hypertension: Secondary | ICD-10-CM | POA: Diagnosis not present

## 2016-12-23 DIAGNOSIS — R079 Chest pain, unspecified: Secondary | ICD-10-CM | POA: Diagnosis not present

## 2016-12-23 LAB — HEMOGLOBIN A1C
HEMOGLOBIN A1C: 6 % — AB (ref 4.8–5.6)
Mean Plasma Glucose: 126 mg/dL

## 2016-12-23 LAB — GLUCOSE, CAPILLARY: Glucose-Capillary: 118 mg/dL — ABNORMAL HIGH (ref 65–99)

## 2016-12-23 LAB — TSH: TSH: 1.969 u[IU]/mL (ref 0.350–4.500)

## 2016-12-23 LAB — HIV ANTIBODY (ROUTINE TESTING W REFLEX): HIV Screen 4th Generation wRfx: NONREACTIVE

## 2016-12-23 MED ORDER — DILTIAZEM HCL ER COATED BEADS 360 MG PO CP24: 360.0000 mg | ORAL_CAPSULE | Freq: Every day | ORAL | 1 refills | Status: DC

## 2016-12-23 MED ORDER — BENAZEPRIL HCL 10 MG PO TABS: 10.0000 mg | ORAL_TABLET | Freq: Every day | ORAL | 1 refills | Status: DC

## 2016-12-23 NOTE — Progress Notes (Signed)
Progress Note  Patient Name: Brian Roach Date of Encounter: 12/23/2016  Primary Cardiologist: Seen by Dr. Diona BrownerMcDowell 2014; due to establish care with Dr. Tenny Crawoss Monday 10/29  Subjective   Feeling fine this AM. Says he felt a lot better after his meds were adjusted. No further pain. No palpitations.  Inpatient Medications    Scheduled Meds: . apixaban  5 mg Oral BID  . atorvastatin  20 mg Oral QPM  . benazepril  10 mg Oral Daily  . diltiazem  360 mg Oral Daily  . insulin aspart  0-9 Units Subcutaneous TID WC  . loratadine  10 mg Oral Daily  . pantoprazole  40 mg Oral Daily   Continuous Infusions: . sodium chloride     PRN Meds: ondansetron **OR** ondansetron (ZOFRAN) IV   Vital Signs    Vitals:   12/22/16 1458 12/22/16 2019 12/22/16 2257 12/23/16 0519  BP: 128/73  119/74 133/86  Pulse: 78  72 84  Resp: 18  17 15   Temp: 98.5 F (36.9 C)  (!) 97.4 F (36.3 C) (!) 97.4 F (36.3 C)  TempSrc: Oral  Oral Oral  SpO2: 97% 96% 99% 98%  Weight:      Height:       No intake or output data in the 24 hours ending 12/23/16 1039 Filed Weights   12/21/16 1650 12/22/16 0032  Weight: 259 lb (117.5 kg) 255 lb 3.2 oz (115.8 kg)    Telemetry    NSR - Personally Reviewed   Physical Exam   GEN: No acute distress.  HEENT: Normocephalic, atraumatic, sclera non-icteric. Neck: No JVD or bruits. Cardiac: RRR no murmurs, rubs, or gallops.  Radials/DP/PT 1+ and equal bilaterally.  Respiratory: Clear to auscultation bilaterally. Breathing is unlabored. GI: Soft, nontender, non-distended, BS +x 4. MS: no deformity. Extremities: No clubbing or cyanosis. No edema. Distal pedal pulses are 2+ and equal bilaterally. L knee in brace Neuro:  AAOx3. Follows commands. Psych:  Responds to questions appropriately with a normal affect.  Labs    Chemistry Recent Labs Lab 12/17/16 2032 12/21/16 1717 12/22/16 0604  NA 138 141 140  K 4.0 4.3 4.7  CL 101 106 102  CO2 25 25 32    GLUCOSE 114* 115* 115*  BUN 18 13 15   CREATININE 1.16 0.94 1.00  CALCIUM 9.7 9.8 9.4  PROT  --   --  7.0  ALBUMIN  --   --  4.2  AST  --   --  25  ALT  --   --  39  ALKPHOS  --   --  58  BILITOT  --   --  0.8  GFRNONAA >60 >60 >60  GFRAA >60 >60 >60  ANIONGAP 12 10 6      Hematology Recent Labs Lab 12/17/16 2032 12/21/16 1717 12/22/16 0604  WBC 15.3* 12.0* 10.3  RBC 5.30 5.35 4.99  HGB 16.4 16.5 15.2  HCT 48.3 48.2 45.7  MCV 91.1 90.1 91.6  MCH 30.9 30.8 30.5  MCHC 34.0 34.2 33.3  RDW 13.4 13.3 13.6  PLT 277 281 247    Cardiac Enzymes Recent Labs Lab 12/21/16 2008 12/22/16 0031 12/22/16 0604 12/22/16 1342  TROPONINI <0.03 <0.03 <0.03 <0.03   No results for input(s): TROPIPOC in the last 168 hours.   BNPNo results for input(s): BNP, PROBNP in the last 168 hours.   DDimer No results for input(s): DDIMER in the last 168 hours.   Radiology    Dg Chest 2  View  Result Date: 12/21/2016 CLINICAL DATA:  Chest pain and tightness beginning last night. EXAM: CHEST  2 VIEW COMPARISON:  CT chest and PA and lateral chest 12/17/2016. PA and lateral chest 05/27/2012. FINDINGS: Volume loss in the right chest and mild right basilar scarring consistent with postoperative change are again seen. Lungs are otherwise clear. No pneumothorax or pleural effusion. Heart size is normal. No acute bony abnormality. IMPRESSION: No acute disease.  Stable compared to prior exams. Electronically Signed   By: Drusilla Kanner M.D.   On: 12/21/2016 17:22   Nm Myocar Multi W/spect W/wall Motion / Ef  Result Date: 12/22/2016  There was no ST segment deviation noted during stress.  Defect 1: There is a medium defect of mild severity present in the mid inferior location, which appears to be due to soft tissue attenuation artifact. No ischemic zones.  This is a low risk study.  Nuclear stress EF: 67%.     Cardiac Studies   Nuc as above  Patient Profile     56 y.o. male with DM, HTN, HLD,  PE (08/2016 after knee surgery, on Eliquis) who was admitted with left shoulder pain and found to have brief runs of SVT and PACs.  Assessment & Plan    1. Left shoulder pain - r/o for MI, nuclear stress test nonischemic with normal LVEF. Not clearly related to arrhythmia but he is feeling better with med adjustment.  2. PSVT/PACs - quiescent on higher dose of diltiazem. Benazepril adjusted to accommodate increase. I added on TSH which returned normal.  3. HTN - controlled with med adjustment.  4. H/o PE - per primary care.  Patient will keep previously arranged f/u appointment with Dr. Tenny Craw 12/27/16. The above plan was reviewed with Dr. Purvis Sheffield who is in agreement.  Signed, Ronie Spies PA-C (pager [phone]) 12/23/2016, 10:39 AM

## 2016-12-23 NOTE — Discharge Summary (Signed)
Physician Discharge Summary  Brian Roach GMW:[id number] DOB: 03-29-1960 DOA: 12/21/2016  PCP: [name], MD  Admit date: 12/21/2016 Discharge date: 12/23/2016  Admitted From: Home Disposition:  Home   Recommendations for Outpatient Follow-up:  1. Follow up with PCP in 1-2 weeks 2. Please obtain BMP/CBC in one week   Discharge Condition: Stable CODE STATUS: FULL Diet recommendation: Heart Healthy    Brief/Interim Summary: 56 year old male with a history of hypertension, hyperlipidemia, and pulmonary embolus (diagnosed September 24, 2016 after knee surgery) presented with 5-day history of left axillary discomfort intermittently radiating down the left arm.  He states that this has occurred usually at rest.  It has not been exacerbated or elicited by activity.  He denies any recent dizziness, nausea, vomiting, fevers, chills.  He states that he has been able to climb stairs and walk around the block without any chest discomfort.  However on the afternoon of December 21, 2016, the patient felt palpitations and "spasm" in his chest.  He felt his pulse and thought it was fast.  As result, the patient presented emergency department for further evaluation.  Troponins have been negative x3.  Chest x-ray was negative.  EKG showed sinus rhythm with nonspecific T wave changes and PACs.  CT angiogram chest on December 18, 2016 was negative for any acute emboli or dissection.  There was no airspace disease.  Discharge Diagnoses:  Atypical chest discomfort -Troponins negative x3 -Consult cardiology-->lexiscan myoview = low risk, EF 67% -12/18/16--CT angiogram chest negative for pulmonary embolus or dissection -Personally reviewed EKG--sinus rhythm, nonspecific T wave changes -Personally reviewed chest x-ray--no infiltrates or edema  Paroxysmal SVT -noted to telemetry -likely accounted for palpitations -cardizem CD increased to 360 mg-->improved -benazepril dose decreased to 10 mg to avoid  hypotension -TSH 1.969  Pulmonary embolus -Diagnosed September 24, 2016 after knee surgery -Continue apixaban  Essential hypertension -Continue benazepril and diltiazem  Diabetes mellitus type 2 -Holding metformin--restart after dc -Continue NovoLog sliding scale -12/21/16-A1C 6.0  Hyperlipidemia -Continue statin  Discharge Instructions  Discharge Instructions    Diet - low sodium heart healthy    Complete by:  As directed    Increase activity slowly    Complete by:  As directed      Allergies as of 12/23/2016   No Known Allergies     Medication List    TAKE these medications   apixaban 5 MG Tabs tablet Commonly known as:  ELIQUIS Take 1 tablet (5 mg total) by mouth 2 (two) times daily.   atorvastatin 20 MG tablet Commonly known as:  LIPITOR TAKE 1 TABLET EVERY EVENING   benazepril 10 MG tablet Commonly known as:  LOTENSIN Take 1 tablet (10 mg total) by mouth daily. What changed:  medication strength  See the new instructions.   cetirizine 10 MG tablet Commonly known as:  ZYRTEC Take 1 tablet (10 mg total) by mouth daily. For allergy symptoms   diltiazem 360 MG 24 hr capsule Commonly known as:  CARDIZEM CD Take 1 capsule (360 mg total) by mouth daily. What changed:  medication strength  See the new instructions.   docusate sodium 100 MG capsule Commonly known as:  COLACE Take 200 mg by mouth 2 (two) times daily.   metFORMIN 500 MG 24 hr tablet Commonly known as:  GLUCOPHAGE-XR TAKE 1 TABLET (500 MG TOTAL) BY MOUTH DAILY WITH BREAKFAST.   omeprazole 20 MG capsule Commonly known as:  PRILOSEC Take 1 capsule (20 mg total) by mouth daily.  ONE-A-DAY MENS HEALTH FORMULA PO Take 1 tablet by mouth daily.      Follow-up Information    Dr. Dietrich Pates Follow up.   Why:  Keep previously scheduled appointment 12/27/16 as below at 8:40 a.m. Contact information: [facility] [address] [place] [address] [phone]           No Known Allergies  Consultations:  cardiology   Procedures/Studies: Dg Chest 2 View  Result Date: 12/21/2016 CLINICAL DATA:  Chest pain and tightness beginning last night. EXAM: CHEST  2 VIEW COMPARISON:  CT chest and PA and lateral chest 12/17/2016. PA and lateral chest 05/27/2012. FINDINGS: Volume loss in the right chest and mild right basilar scarring consistent with postoperative change are again seen. Lungs are otherwise clear. No pneumothorax or pleural effusion. Heart size is normal. No acute bony abnormality. IMPRESSION: No acute disease.  Stable compared to prior exams. Electronically Signed   By: Drusilla Kanner M.D.   On: 12/21/2016 17:22   Dg Chest 2 View  Result Date: 12/17/2016 CLINICAL DATA:  Chest pain.  History of pulmonary embolism. EXAM: CHEST  2 VIEW COMPARISON:  09/24/2016 FINDINGS: Lower thoracic spondylosis. Lateral view degraded by patient arm position. Moderate right hemidiaphragm elevation is similar. Midline trachea. Normal heart size. No pleural effusion or pneumothorax. Slight increase in probable scarring at the right lung base laterally. Clear left lung. IMPRESSION: No acute cardiopulmonary disease. Moderate right hemidiaphragm elevation with progressive right lung base scarring. Electronically Signed   By: Jeronimo Greaves M.D.   On: 12/17/2016 21:21   Ct Angio Chest Pe W And/or Wo Contrast  Result Date: 12/18/2016 CLINICAL DATA:  Chest tightness for 2 days. History of blood clots in the lungs. Status post knee surgery. EXAM: CT ANGIOGRAPHY CHEST WITH CONTRAST TECHNIQUE: Multidetector CT imaging of the chest was performed using the standard protocol during bolus administration of intravenous contrast. Multiplanar CT image reconstructions and MIPs were obtained to evaluate the vascular anatomy. CONTRAST:  100 mL Isovue 370 COMPARISON:  09/24/2016 FINDINGS: Cardiovascular: Good opacification of the central and segmental pulmonary artery's. No residual  filling defects are identified. This represents interval resolution of previous pulmonary emboli. No evidence of acute pulmonary embolus today. Normal caliber thoracic aorta. No aortic dissection. Great vessel origins are patent. Minimal vascular calcifications in the aorta. Normal heart size. No pericardial effusion. Mediastinum/Nodes: No significant lymphadenopathy. Esophagus is mostly decompressed. Lungs/Pleura: Motion artifact limits evaluation of the lungs. Mild dependent changes. No airspace disease or consolidation. No pleural effusions. No pneumothorax. Prominent pleural fat. Upper Abdomen: No acute process demonstrated in the visualized upper abdomen. Musculoskeletal: Degenerative changes throughout the spine. No destructive bone lesions. Review of the MIP images confirms the above findings. IMPRESSION: 1. No evidence of significant pulmonary embolus, indicating interval resolution of previous pulmonary emboli. 2. No evidence of active pulmonary disease. Electronically Signed   By: Burman Nieves M.D.   On: 12/18/2016 00:52   Nm Myocar Multi W/spect W/wall Motion / Ef  Result Date: 12/22/2016  There was no ST segment deviation noted during stress.  Defect 1: There is a medium defect of mild severity present in the mid inferior location, which appears to be due to soft tissue attenuation artifact. No ischemic zones.  This is a low risk study.  Nuclear stress EF: 67%.          Discharge Exam: Vitals:   12/22/16 2257 12/23/16 0519  BP: 119/74 133/86  Pulse: 72 84  Resp: 17  15  Temp: (!) 97.4 F (36.3 C) (!) 97.4 F (36.3 C)  SpO2: 99% 98%   Vitals:   12/22/16 1458 12/22/16 2019 12/22/16 2257 12/23/16 0519  BP: 128/73  119/74 133/86  Pulse: 78  72 84  Resp: 18  17 15   Temp: 98.5 F (36.9 C)  (!) 97.4 F (36.3 C) (!) 97.4 F (36.3 C)  TempSrc: Oral  Oral Oral  SpO2: 97% 96% 99% 98%  Weight:      Height:        General: Pt is alert, awake, not in acute  distress Cardiovascular: RRR, S1/S2 +, no rubs, no gallops Respiratory: CTA bilaterally, no wheezing, no rhonchi Abdominal: Soft, NT, ND, bowel sounds + Extremities: no edema, no cyanosis   The results of significant diagnostics from this hospitalization (including imaging, microbiology, ancillary and laboratory) are listed below for reference.    Significant Diagnostic Studies: Dg Chest 2 View  Result Date: 12/21/2016 CLINICAL DATA:  Chest pain and tightness beginning last night. EXAM: CHEST  2 VIEW COMPARISON:  CT chest and PA and lateral chest 12/17/2016. PA and lateral chest 05/27/2012. FINDINGS: Volume loss in the right chest and mild right basilar scarring consistent with postoperative change are again seen. Lungs are otherwise clear. No pneumothorax or pleural effusion. Heart size is normal. No acute bony abnormality. IMPRESSION: No acute disease.  Stable compared to prior exams. Electronically Signed   By: Drusilla Kanner M.D.   On: 12/21/2016 17:22   Dg Chest 2 View  Result Date: 12/17/2016 CLINICAL DATA:  Chest pain.  History of pulmonary embolism. EXAM: CHEST  2 VIEW COMPARISON:  09/24/2016 FINDINGS: Lower thoracic spondylosis. Lateral view degraded by patient arm position. Moderate right hemidiaphragm elevation is similar. Midline trachea. Normal heart size. No pleural effusion or pneumothorax. Slight increase in probable scarring at the right lung base laterally. Clear left lung. IMPRESSION: No acute cardiopulmonary disease. Moderate right hemidiaphragm elevation with progressive right lung base scarring. Electronically Signed   By: Jeronimo Greaves M.D.   On: 12/17/2016 21:21   Ct Angio Chest Pe W And/or Wo Contrast  Result Date: 12/18/2016 CLINICAL DATA:  Chest tightness for 2 days. History of blood clots in the lungs. Status post knee surgery. EXAM: CT ANGIOGRAPHY CHEST WITH CONTRAST TECHNIQUE: Multidetector CT imaging of the chest was performed using the standard protocol during  bolus administration of intravenous contrast. Multiplanar CT image reconstructions and MIPs were obtained to evaluate the vascular anatomy. CONTRAST:  100 mL Isovue 370 COMPARISON:  09/24/2016 FINDINGS: Cardiovascular: Good opacification of the central and segmental pulmonary artery's. No residual filling defects are identified. This represents interval resolution of previous pulmonary emboli. No evidence of acute pulmonary embolus today. Normal caliber thoracic aorta. No aortic dissection. Great vessel origins are patent. Minimal vascular calcifications in the aorta. Normal heart size. No pericardial effusion. Mediastinum/Nodes: No significant lymphadenopathy. Esophagus is mostly decompressed. Lungs/Pleura: Motion artifact limits evaluation of the lungs. Mild dependent changes. No airspace disease or consolidation. No pleural effusions. No pneumothorax. Prominent pleural fat. Upper Abdomen: No acute process demonstrated in the visualized upper abdomen. Musculoskeletal: Degenerative changes throughout the spine. No destructive bone lesions. Review of the MIP images confirms the above findings. IMPRESSION: 1. No evidence of significant pulmonary embolus, indicating interval resolution of previous pulmonary emboli. 2. No evidence of active pulmonary disease. Electronically Signed   By: Burman Nieves M.D.   On: 12/18/2016 00:52   Nm Myocar Multi W/spect W/wall Motion / Ef  Result Date:  12/22/2016  There was no ST segment deviation noted during stress.  Defect 1: There is a medium defect of mild severity present in the mid inferior location, which appears to be due to soft tissue attenuation artifact. No ischemic zones.  This is a low risk study.  Nuclear stress EF: 67%.      Microbiology: No results found for this or any previous visit (from the past 240 hour(s)).   Labs: Basic Metabolic Panel:  Recent Labs Lab 12/17/16 2032 12/21/16 1717 12/22/16 0031 12/22/16 0604  NA 138 141  --  140  K  4.0 4.3  --  4.7  CL 101 106  --  102  CO2 25 25  --  32  GLUCOSE 114* 115*  --  115*  BUN 18 13  --  15  CREATININE 1.16 0.94  --  1.00  CALCIUM 9.7 9.8  --  9.4  MG  --   --  2.7*  --    Liver Function Tests:  Recent Labs Lab 12/22/16 0604  AST 25  ALT 39  ALKPHOS 58  BILITOT 0.8  PROT 7.0  ALBUMIN 4.2   No results for input(s): LIPASE, AMYLASE in the last 168 hours. No results for input(s): AMMONIA in the last 168 hours. CBC:  Recent Labs Lab 12/17/16 2032 12/21/16 1717 12/22/16 0604  WBC 15.3* 12.0* 10.3  HGB 16.4 16.5 15.2  HCT 48.3 48.2 45.7  MCV 91.1 90.1 91.6  PLT 277 281 247   Cardiac Enzymes:  Recent Labs Lab 12/21/16 1717 12/21/16 2008 12/22/16 0031 12/22/16 0604 12/22/16 1342  TROPONINI <0.03 <0.03 <0.03 <0.03 <0.03   BNP: Invalid input(s): POCBNP CBG:  Recent Labs Lab 12/22/16 0750 12/22/16 1111 12/22/16 1729 12/22/16 2252 12/23/16 0810  GLUCAP 109* 118* 150* 114* 118*    Time coordinating discharge:  Greater than 30 minutes  Signed:  Mayukha Symmonds, DO [facility] Pager: [phone] 12/23/2016, 12:45 PM

## 2016-12-23 NOTE — Progress Notes (Signed)
Patient's IV removed.  Site WNL.  AVS reviewed with patient.  Verbalized understanding of discharge instructions, physician follow-up, medications.  Patient transported by RN via wheelchair to main entrance at discharge.  Patient's belongings in possession at discharge.  Patient stable at time of discharge.

## 2016-12-24 ENCOUNTER — Encounter: Payer: Self-pay | Admitting: Family Medicine

## 2016-12-24 ENCOUNTER — Ambulatory Visit (INDEPENDENT_AMBULATORY_CARE_PROVIDER_SITE_OTHER): Payer: BC Managed Care – PPO | Admitting: Family Medicine

## 2016-12-24 VITALS — BP 122/75 | HR 72 | Temp 97.2°F | Ht 70.0 in | Wt 258.0 lb

## 2016-12-24 DIAGNOSIS — I471 Supraventricular tachycardia: Secondary | ICD-10-CM | POA: Diagnosis not present

## 2016-12-24 DIAGNOSIS — E119 Type 2 diabetes mellitus without complications: Secondary | ICD-10-CM

## 2016-12-24 DIAGNOSIS — Z7901 Long term (current) use of anticoagulants: Secondary | ICD-10-CM

## 2016-12-24 DIAGNOSIS — E785 Hyperlipidemia, unspecified: Secondary | ICD-10-CM | POA: Diagnosis not present

## 2016-12-24 MED ORDER — ATORVASTATIN CALCIUM 20 MG PO TABS: 20.0000 mg | ORAL_TABLET | Freq: Every evening | ORAL | 1 refills | Status: DC

## 2016-12-24 MED ORDER — METFORMIN HCL ER 500 MG PO TB24: 500.0000 mg | ORAL_TABLET | Freq: Every day | ORAL | 3 refills | Status: DC

## 2016-12-24 MED ORDER — OMEPRAZOLE 20 MG PO CPDR: 20.0000 mg | DELAYED_RELEASE_CAPSULE | Freq: Every day | ORAL | 1 refills | Status: DC

## 2016-12-24 NOTE — Progress Notes (Signed)
Subjective:  Patient ID: Brian Roach, male    DOB: 03-Jun-1960  Age: 56 y.o. MRN: [id number]  CC: Diabetes (pt here today for routine follow up of his chronic medical conditions and also was just released from hospital yesterday )   HPI Brian Roach presents for Follow-up of diabetes. Patient does not check blood sugar at home Patient denies symptoms such as polyuria, polydipsia, excessive hunger, nausea No significant hypoglycemic spells noted. Medications as noted below. Taking them regularly without complication/adverse reaction being reported today.  Patient in for follow-up of elevated cholesterol. Doing well without complaints on current medication. Denies side effects of statin including myalgia and arthralgia and nausea. Also in today for liver function testing. Currently no chest pain, shortness of breath or other cardiovascular related symptoms noted.  follow-up of hypertension. Patient has no history of headache chest pain or shortness of breath or recent cough. Patient also denies symptoms of TIA such as numbness weakness lateralizing. Patient checks  blood pressure at home and has not had any elevated readings recently. Patient denies side effects from his medication. States taking it regularly. Patient was admitted for chest pain 3 days ago. Cardiac monitoring showed SVT. Diltiazem was increased and symptoms resolved both subjectively and on the cardiac monitor the hospital per patient history. Hospital discharge summary H&P both reviewed.  Depression screen [facility] 2/9 12/24/2016 12/17/2016 08/11/2016  Decreased Interest 0 0 0  Down, Depressed, Hopeless 0 0 0  PHQ - 2 Score 0 0 0    History Albon has a past medical history of Diabetes mellitus without complication (HCC); Hyperlipidemia; Hypertension; and PE (pulmonary embolism).   He has a past surgical history that includes Back surgery; Knee surgery; Lung surgery; Anterior cruciate ligament repair (Right); Wisdom tooth  extraction; and Quadriceps tendon repair (Left, 09/23/2016).   His family history includes Heart disease in his father.He reports that he has never smoked. He has never used smokeless tobacco. He reports that he does not drink alcohol or use drugs.    ROS Review of Systems  Constitutional: Negative for chills, diaphoresis, fever and unexpected weight change.  HENT: Negative for congestion, hearing loss, rhinorrhea and sore throat.   Eyes: Negative for visual disturbance.  Respiratory: Negative for cough and shortness of breath.   Cardiovascular: Negative for chest pain.  Gastrointestinal: Negative for abdominal pain, constipation and diarrhea.  Genitourinary: Negative for dysuria and flank pain.  Musculoskeletal: Positive for arthralgias. Negative for joint swelling.  Skin: Negative for rash.  Neurological: Negative for dizziness and headaches.  Psychiatric/Behavioral: Negative for dysphoric mood and sleep disturbance.    Objective:  BP 122/75   Pulse 72   Temp (!) 97.2 F (36.2 C) (Oral)   Ht 5\' 10"  (1.778 m)   Wt 258 lb (117 kg)   BMI 37.02 kg/m   BP Readings from Last 3 Encounters:  12/24/16 122/75  12/23/16 133/86  12/18/16 124/78    Wt Readings from Last 3 Encounters:  12/24/16 258 lb (117 kg)  12/22/16 255 lb 3.2 oz (115.8 kg)  12/17/16 260 lb (117.9 kg)     Physical Exam  Constitutional: He is oriented to person, place, and time. He appears well-developed and well-nourished. No distress.  obese  HENT:  Head: Normocephalic and atraumatic.  Right Ear: External ear normal.  Left Ear: External ear normal.  Nose: Nose normal.  Mouth/Throat: Oropharynx is clear and moist.  Eyes: Pupils are equal, round, and reactive to light. Conjunctivae and EOM are normal.  Neck: Normal range of motion. Neck supple. No thyromegaly present.  Cardiovascular: Normal rate, regular rhythm and normal heart sounds.   No murmur heard. Pulmonary/Chest: Effort normal and breath sounds  normal. No respiratory distress. He has no wheezes. He has no rales.  Abdominal: Soft. Bowel sounds are normal. He exhibits no distension. There is no tenderness.  Musculoskeletal: He exhibits no edema.  Wearing knee brace   Lymphadenopathy:    He has no cervical adenopathy.  Neurological: He is alert and oriented to person, place, and time. He has normal reflexes.  Skin: Skin is warm and dry.  Psychiatric: He has a normal mood and affect. His behavior is normal. Judgment and thought content normal.      Assessment & Plan:   Daniyel was seen today for diabetes.  Diagnoses and all orders for this visit:  Hyperlipidemia, unspecified hyperlipidemia type -     Lipid panel  SVT (supraventricular tachycardia) (HCC)  Diabetes mellitus without complication (HCC)  Anticoagulation adequate  Other orders -     atorvastatin (LIPITOR) 20 MG tablet; Take 1 tablet (20 mg total) by mouth every evening. -     metFORMIN (GLUCOPHAGE-XR) 500 MG 24 hr tablet; Take 1 tablet (500 mg total) by mouth daily with breakfast. -     omeprazole (PRILOSEC) 20 MG capsule; Take 1 capsule (20 mg total) by mouth daily.       I have changed Mr. Roach's atorvastatin. I am also having him maintain his docusate sodium, Multiple Vitamins-Minerals (ONE-A-DAY MENS HEALTH FORMULA PO), apixaban, cetirizine, benazepril, diltiazem, traZODone, metFORMIN, and omeprazole.  Allergies as of 12/24/2016   No Known Allergies     Medication List       Accurate as of 12/24/16  9:09 AM. Always use your most recent med list.          apixaban 5 MG Tabs tablet Commonly known as:  ELIQUIS Take 1 tablet (5 mg total) by mouth 2 (two) times daily.   atorvastatin 20 MG tablet Commonly known as:  LIPITOR Take 1 tablet (20 mg total) by mouth every evening.   benazepril 10 MG tablet Commonly known as:  LOTENSIN Take 1 tablet (10 mg total) by mouth daily.   cetirizine 10 MG tablet Commonly known as:  ZYRTEC Take 1  tablet (10 mg total) by mouth daily. For allergy symptoms   diltiazem 360 MG 24 hr capsule Commonly known as:  CARDIZEM CD Take 1 capsule (360 mg total) by mouth daily.   docusate sodium 100 MG capsule Commonly known as:  COLACE Take 200 mg by mouth 2 (two) times daily.   metFORMIN 500 MG 24 hr tablet Commonly known as:  GLUCOPHAGE-XR Take 1 tablet (500 mg total) by mouth daily with breakfast.   omeprazole 20 MG capsule Commonly known as:  PRILOSEC Take 1 capsule (20 mg total) by mouth daily.   ONE-A-DAY MENS HEALTH FORMULA PO Take 1 tablet by mouth daily.   traZODone 150 MG tablet Commonly known as:  DESYREL USE FROM 1/3 TO 1 TABLET NIGHTLY BY MOUTH AS NEEDED FOR SLEEP.        Follow-up: Return in about 3 months (around 03/26/2017).  [name], M.D.

## 2016-12-24 NOTE — Patient Instructions (Signed)
Avoid decongestants 

## 2016-12-25 LAB — LIPID PANEL
CHOL/HDL RATIO: 3 ratio (ref 0.0–5.0)
Cholesterol, Total: 124 mg/dL (ref 100–199)
HDL: 41 mg/dL (ref >39)
LDL CALC: 62 mg/dL (ref 0–99)
TRIGLYCERIDES: 104 mg/dL (ref 0–149)
VLDL Cholesterol Cal: 21 mg/dL (ref 5–40)

## 2016-12-27 ENCOUNTER — Encounter: Payer: Self-pay | Admitting: Internal Medicine

## 2016-12-27 ENCOUNTER — Ambulatory Visit (INDEPENDENT_AMBULATORY_CARE_PROVIDER_SITE_OTHER): Payer: BC Managed Care – PPO | Admitting: Internal Medicine

## 2016-12-27 VITALS — BP 134/86 | HR 91 | Ht 70.0 in | Wt 258.0 lb

## 2016-12-27 DIAGNOSIS — I10 Essential (primary) hypertension: Secondary | ICD-10-CM

## 2016-12-27 DIAGNOSIS — I2782 Chronic pulmonary embolism: Secondary | ICD-10-CM | POA: Diagnosis not present

## 2016-12-27 DIAGNOSIS — R0789 Other chest pain: Secondary | ICD-10-CM | POA: Diagnosis not present

## 2016-12-27 DIAGNOSIS — I471 Supraventricular tachycardia: Secondary | ICD-10-CM

## 2016-12-27 NOTE — Progress Notes (Signed)
Cardiology Office Note   Date:  12/27/2016   ID:  BUZ DORGAN, DOB August 01, 1960, MRN [id number]  PCP:  [name], MD  Cardiologist:   Dietrich Pates, MD NEW  Pt referred by Dr Darlyn Read for     History of Present Illness: Brian Roach is a 56 y.o. male with a history of DM, hyperlipidemia, HTN, pullmonary embouse (July 2018 after knee surgerY) CP and SVT   PT recently admitted to [facility] for chest/axillary pain  At rest  Not exacebated by activity  On day of admit had heart racing  Felt pulse  Wnet to ED  R/O for MI  CT neg for PE  Myovue  Was done  Low risk  AVT doted on tele and pt's cardiacem was increased    Pt still with intermitted L sided chest discomfort and arm pain  Did not feel his heart flutter then   No SOB    Pt says that he had fluttering when he came in the last time  Has not had since     Current Meds  Medication Sig  . apixaban (ELIQUIS) 5 MG TABS tablet Take 1 tablet (5 mg total) by mouth 2 (two) times daily.  Marland Kitchen atorvastatin (LIPITOR) 20 MG tablet Take 1 tablet (20 mg total) by mouth every evening.  . benazepril (LOTENSIN) 10 MG tablet Take 1 tablet (10 mg total) by mouth daily.  . cetirizine (ZYRTEC) 10 MG tablet Take 1 tablet (10 mg total) by mouth daily. For allergy symptoms  . diltiazem (CARDIZEM CD) 360 MG 24 hr capsule Take 1 capsule (360 mg total) by mouth daily.  Marland Kitchen docusate sodium (COLACE) 100 MG capsule Take 200 mg by mouth 2 (two) times daily.  . metFORMIN (GLUCOPHAGE-XR) 500 MG 24 hr tablet Take 1 tablet (500 mg total) by mouth daily with breakfast.  . Multiple Vitamins-Minerals (ONE-A-DAY MENS HEALTH FORMULA PO) Take 1 tablet by mouth daily.  Marland Kitchen omeprazole (PRILOSEC) 20 MG capsule Take 1 capsule (20 mg total) by mouth daily.  . traZODone (DESYREL) 150 MG tablet USE FROM 1/3 TO 1 TABLET NIGHTLY BY MOUTH AS NEEDED FOR SLEEP.     Allergies:   Patient has no known allergies.   Past Medical History:  Diagnosis Date  . Diabetes mellitus without  complication (HCC)   . Hyperlipidemia   . Hypertension   . PE (pulmonary embolism)     Past Surgical History:  Procedure Laterality Date  . ANTERIOR CRUCIATE LIGAMENT REPAIR Right   . BACK SURGERY    . KNEE SURGERY    . LUNG SURGERY     Unspecified but was done for PE. Had chest tubes. Approx 2003  . QUADRICEPS TENDON REPAIR Left 09/23/2016   Procedure: REPAIR QUADRICEP TENDON;  Surgeon: Vickki Hearing, MD;  Location: [facility];  Service: Orthopedics;  Laterality: Left;  . WISDOM TOOTH EXTRACTION       Social History:  The patient  reports that he has never smoked. He has never used smokeless tobacco. He reports that he does not drink alcohol or use drugs.   Family History:  The patient's family history includes Heart disease in his brother and father.    ROS:  Please see the history of present illness. All other systems are reviewed and  Negative to the above problem except as noted.    PHYSICAL EXAM: VS:  BP 134/86 (BP Location: Left Arm)   Pulse 91   Ht 5\' 10"  (1.778 m)  Wt 258 lb (117 kg)   SpO2 97%   BMI 37.02 kg/m   GEN: Well nourished, well developed, in no acute distress  HEENT: normal  Neck: no JVD, carotid bruits, or masses Cardiac: RRR; no murmurs, rubs, or gallops,no edema  Respiratory:  clear to auscultation bilaterally, normal work of breathing GI: soft, nontender, nondistended, + BS  No hepatomegaly  MS: no deformity Moving all extremities   Skin: warm and dry, no rash Neuro:  Strength and sensation are intact Psych: euthymic mood, full affect   EKG:  EKG is NOT ordered today.   Lipid Panel    Component Value Date/Time   CHOL 124 12/24/2016 0845   TRIG 104 12/24/2016 0845   HDL 41 12/24/2016 0845   CHOLHDL 3.0 12/24/2016 0845   CHOLHDL 3.3 05/29/2012 1045   VLDL 23 05/29/2012 1045   LDLCALC 62 12/24/2016 0845      Wt Readings from Last 3 Encounters:  12/27/16 258 lb (117 kg)  12/24/16 258 lb (117 kg)  12/22/16 255 lb 3.2 oz (115.8 kg)       ASSESSMENT AND PLAN:  1  Chest dyscomfort  Atypical  I do not think it is cardiac in origin  ? Muscular  ? Related to recovery from PE  CT was negative   I would not plan furhter cardiac testing  Myovue was low risk    2  Hx PE  This is the 2nd event that pt has had post surgery  PE was large  Will review clotting work up  Keep on Eliquis for now   3  Hx SVT  Pt had spells while in hosp  Caught on tele.  I am not convincied he is symptomatic  IF he starts to sense spells more I would set up for event monitor  Keep on Dilt 360   I am not convinced related to problem 1     Current medicines are reviewed at length with the patient today.  The patient does not have concerns regarding medicines.  Signed, Dietrich Pates, MD  12/27/2016 8:50 AM    [facility] [address] [place], [place], [place]  [address] Phone: [phone]; Fax: [phone]

## 2016-12-27 NOTE — Patient Instructions (Signed)
Your physician recommends that you schedule a follow-up appointment in:  As needed with Dr.Ross     Your physician recommends that you continue on your current medications as directed. Please refer to the Current Medication list given to you today.      No lab work or testing ordered today.       Thank you for choosing [facility] !

## 2017-01-05 ENCOUNTER — Ambulatory Visit: Payer: BC Managed Care – PPO | Admitting: Orthopedic Surgery

## 2017-01-05 ENCOUNTER — Encounter: Payer: Self-pay | Admitting: Orthopedic Surgery

## 2017-01-05 VITALS — BP 141/85 | HR 82 | Ht 70.0 in | Wt 262.0 lb

## 2017-01-05 DIAGNOSIS — S76112D Strain of left quadriceps muscle, fascia and tendon, subsequent encounter: Secondary | ICD-10-CM | POA: Diagnosis not present

## 2017-01-05 DIAGNOSIS — I2782 Chronic pulmonary embolism: Secondary | ICD-10-CM

## 2017-01-05 NOTE — Patient Instructions (Signed)
Home exercises for 3 months  

## 2017-01-05 NOTE — Progress Notes (Signed)
Chief Complaint  Patient presents with  . Routine Post Op    09/23/16 Quad tendon repair    This is day 104 after quadriceps tendon repair left knee comp gated by pulmonary embolus  Patient is doing well with T ROM brace.  He does feel some tightness in his knee and some weakness in the left quadriceps  Review of systems he has been worked up with a Holter monitor for palpitations in his chest he had another CAT scan to rule out a pulmonary embolism which was normal  BP (!) 141/85   Pulse 82   Ht 5\' 10"  (1.778 m)   Wt 262 lb (118.8 kg)   BMI 37.59 kg/m  Physical Exam  Constitutional: He is oriented to person, place, and time. He appears well-developed and well-nourished.  Neurological: He is alert and oriented to person, place, and time.  Skin: Skin is warm and dry.    Pulse and perfusion are normal in the left leg Incision well-healed.  No swelling or effusion.  Knee flexion is 130 degrees ligaments are stable weakness to manual muscle testing of the quadriceps but there is no extensor lag on straight leg raise  Encounter Diagnoses  Name Primary?  . Quadriceps muscle rupture, left, subsequent encounter Yes  . Other chronic pulmonary embolism without acute cor pulmonale (HCC)     Quadriceps surgery: He can progress now to home exercise program for 3 months to continue strengthening and progressively return to normal activity  Pulmonary embolus continue anticoagulation follow-up with Dr. Juanetta GoslingHawkins    follow-up 3 months

## 2017-01-07 ENCOUNTER — Ambulatory Visit: Payer: BC Managed Care – PPO | Admitting: Family Medicine

## 2017-01-07 ENCOUNTER — Encounter: Payer: Self-pay | Admitting: Family Medicine

## 2017-01-07 VITALS — BP 123/79 | HR 78 | Temp 98.2°F | Ht 70.0 in | Wt 261.0 lb

## 2017-01-07 DIAGNOSIS — I10 Essential (primary) hypertension: Secondary | ICD-10-CM | POA: Diagnosis not present

## 2017-01-07 NOTE — Progress Notes (Signed)
Subjective:  Patient ID: Brian Roach, male    DOB: 09-30-1960  Age: 56 y.o. MRN: [id number]  CC: Blood Pressure Check (pt here today for BP check after having elevated BP at ortho. His BP reading was 141/85 and pt was concerned.)   HPI Brian Roach presents for  follow-up of hypertension. Patient has no history of headache chest pain or shortness of breath or recent cough. Patient also denies symptoms of TIA such as focal numbness or weakness.  Patient does not check his blood pressure.  However he went to the orthopedist earlier this week and was told that his blood pressure was a bit elevated.  He became concerned that this might represent a problem.  He had his blood pressure checked earlier today and it was normal.  History Brian Roach has a past medical history of Diabetes mellitus without complication (HCC), Hyperlipidemia, Hypertension, and PE (pulmonary embolism).   He has a past surgical history that includes Back surgery; Knee surgery; Lung surgery; Anterior cruciate ligament repair (Right); Wisdom tooth extraction; and REPAIR QUADRICEP TENDON (Left, 09/23/2016).   His family history includes Heart disease in his brother and father.He reports that  has never smoked. he has never used smokeless tobacco. He reports that he does not drink alcohol or use drugs.  Current Outpatient Medications on File Prior to Visit  Medication Sig Dispense Refill  . apixaban (ELIQUIS) 5 MG TABS tablet Take 1 tablet (5 mg total) by mouth 2 (two) times daily. 60 tablet 5  . atorvastatin (LIPITOR) 20 MG tablet Take 1 tablet (20 mg total) by mouth every evening. 90 tablet 1  . benazepril (LOTENSIN) 10 MG tablet Take 1 tablet (10 mg total) by mouth daily. 30 tablet 1  . cetirizine (ZYRTEC) 10 MG tablet Take 1 tablet (10 mg total) by mouth daily. For allergy symptoms 90 tablet 3  . diltiazem (CARDIZEM CD) 360 MG 24 hr capsule Take 1 capsule (360 mg total) by mouth daily. 30 capsule 1  . docusate sodium  (COLACE) 100 MG capsule Take 200 mg by mouth 2 (two) times daily.    . metFORMIN (GLUCOPHAGE-XR) 500 MG 24 hr tablet Take 1 tablet (500 mg total) by mouth daily with breakfast. 90 tablet 3  . Multiple Vitamins-Minerals (ONE-A-DAY MENS HEALTH FORMULA PO) Take 1 tablet by mouth daily.    Marland Kitchen omeprazole (PRILOSEC) 20 MG capsule Take 1 capsule (20 mg total) by mouth daily. 90 capsule 1  . traZODone (DESYREL) 150 MG tablet USE FROM 1/3 TO 1 TABLET NIGHTLY BY MOUTH AS NEEDED FOR SLEEP.  5   No current facility-administered medications on file prior to visit.     ROS Review of Systems Noncontributory except as noted in HPI Objective:  BP 123/79   Pulse 78   Temp 98.2 F (36.8 C) (Oral)   Ht 5\' 10"  (1.778 m)   Wt 261 lb (118.4 kg)   BMI 37.45 kg/m   BP Readings from Last 3 Encounters:  01/07/17 123/79  01/05/17 (!) 141/85  12/27/16 134/86    Wt Readings from Last 3 Encounters:  01/07/17 261 lb (118.4 kg)  01/05/17 262 lb (118.8 kg)  12/27/16 258 lb (117 kg)     Physical Exam  Constitutional: He is oriented to person, place, and time. He appears well-developed and well-nourished.  HENT:  Head: Normocephalic and atraumatic.  Right Ear: External ear normal.  Left Ear: External ear normal.  Mouth/Throat: No oropharyngeal exudate or posterior oropharyngeal erythema.  Eyes:  Pupils are equal, round, and reactive to light.  Neck: Normal range of motion. Neck supple.  Cardiovascular: Normal rate and regular rhythm.  No murmur heard. Pulmonary/Chest: Breath sounds normal. No respiratory distress.  Neurological: He is alert and oriented to person, place, and time.  Vitals reviewed.     Assessment & Plan:   Brian Roach was seen today for blood pressure check.  Diagnoses and all orders for this visit:  HTN (hypertension), benign   Allergies as of 01/07/2017   No Known Allergies     Medication List        Accurate as of 01/07/17  5:13 PM. Always use your most recent med list.            apixaban 5 MG Tabs tablet Commonly known as:  ELIQUIS Take 1 tablet (5 mg total) by mouth 2 (two) times daily.   atorvastatin 20 MG tablet Commonly known as:  LIPITOR Take 1 tablet (20 mg total) by mouth every evening.   benazepril 10 MG tablet Commonly known as:  LOTENSIN Take 1 tablet (10 mg total) by mouth daily.   cetirizine 10 MG tablet Commonly known as:  ZYRTEC Take 1 tablet (10 mg total) by mouth daily. For allergy symptoms   diltiazem 360 MG 24 hr capsule Commonly known as:  CARDIZEM CD Take 1 capsule (360 mg total) by mouth daily.   docusate sodium 100 MG capsule Commonly known as:  COLACE Take 200 mg by mouth 2 (two) times daily.   metFORMIN 500 MG 24 hr tablet Commonly known as:  GLUCOPHAGE-XR Take 1 tablet (500 mg total) by mouth daily with breakfast.   omeprazole 20 MG capsule Commonly known as:  PRILOSEC Take 1 capsule (20 mg total) by mouth daily.   ONE-A-DAY MENS HEALTH FORMULA PO Take 1 tablet by mouth daily.   traZODone 150 MG tablet Commonly known as:  DESYREL USE FROM 1/3 TO 1 TABLET NIGHTLY BY MOUTH AS NEEDED FOR SLEEP.       No orders of the defined types were placed in this encounter.   Patient was reassured that today's blood pressure reading was not abnormal or a problem for him.  Additionally we discussed a low-salt diet.  He should pursue weight loss.  Consider buying a blood pressure monitor and checking at home.  (He declined to do this)  Follow-up: Return in about 3 months (around 04/09/2017) for diabetes, hypertension.  [name], M.D.

## 2017-01-13 ENCOUNTER — Encounter: Payer: Self-pay | Admitting: Family Medicine

## 2017-01-13 ENCOUNTER — Ambulatory Visit: Payer: BC Managed Care – PPO | Admitting: Family Medicine

## 2017-01-13 VITALS — BP 122/79 | HR 119 | Temp 97.5°F | Ht 70.0 in | Wt 263.0 lb

## 2017-01-13 DIAGNOSIS — A084 Viral intestinal infection, unspecified: Secondary | ICD-10-CM

## 2017-01-13 DIAGNOSIS — B09 Unspecified viral infection characterized by skin and mucous membrane lesions: Secondary | ICD-10-CM

## 2017-01-13 MED ORDER — ONDANSETRON 4 MG PO TBDP: 4.0000 mg | ORAL_TABLET | Freq: Three times a day (TID) | ORAL | 0 refills | Status: DC | PRN

## 2017-01-13 NOTE — Progress Notes (Signed)
BP 122/79   Pulse (!) 119   Temp (!) 97.5 F (36.4 C) (Oral)   Ht 5\' 10"  (1.778 m)   Wt 263 lb (119.3 kg)   BMI 37.74 kg/m    Subjective:    Patient ID: Brian Roach, male    DOB: 04/07/1960, 56 y.o.   MRN: [id number]  HPI: Brian Roach is a 56 y.o. male presenting on 01/13/2017 for diarrhea and vomiting (diarrhea began last night, vomiting early this morning) and Rash (itchy rash all over body when he woke up this morning)   HPI Diarrhea and vomiting and rash Patient has developed diarrhea and vomiting and then he developed a rash.  The diarrhea and vomiting began just last night and then the rash began today.  He had a few episodes of diarrhea starting last night and then vomiting this morning.  He then this morning developed a rash which started on various abdomen and then spread over his whole body.  The rash he says is very pruritic.  He says he does not have a rash on his face or head or in his genitals or on his hands or soles of his feet.  Rash pretty much covers most of the other parts of his body.  He denies any fevers or chills.  He denies any blood in his stool or blood in his vomit.  He does feel like today he is starting to feel better through the day and not having so much nausea and has not had any further vomiting since early this morning.  He does admit that he ate a leftover Chinese dish that his wife had eaten a couple days before and she had gotten a little sick from it and then he after he ate it yesterday earlier he developed the symptoms last night.  He denies any new or changes in medications or environment.  Relevant past medical, surgical, family and social history reviewed and updated as indicated. Interim medical history since our last visit reviewed. Allergies and medications reviewed and updated.  Review of Systems  Constitutional: Negative for chills and fever.  Eyes: Negative for discharge.  Respiratory: Negative for shortness of breath and  wheezing.   Cardiovascular: Negative for chest pain and leg swelling.  Gastrointestinal: Positive for abdominal pain, diarrhea, nausea and vomiting. Negative for anal bleeding, blood in stool and constipation.  Musculoskeletal: Negative for back pain and gait problem.  Skin: Positive for rash.  All other systems reviewed and are negative.   Per HPI unless specifically indicated above        Objective:    BP 122/79   Pulse (!) 119   Temp (!) 97.5 F (36.4 C) (Oral)   Ht 5\' 10"  (1.778 m)   Wt 263 lb (119.3 kg)   BMI 37.74 kg/m   Wt Readings from Last 3 Encounters:  01/13/17 263 lb (119.3 kg)  01/07/17 261 lb (118.4 kg)  01/05/17 262 lb (118.8 kg)    Physical Exam  Constitutional: He is oriented to person, place, and time. He appears well-developed and well-nourished. No distress.  Eyes: Conjunctivae are normal. No scleral icterus.  Cardiovascular: Normal rate, regular rhythm, normal heart sounds and intact distal pulses.  No murmur heard. Pulmonary/Chest: Effort normal and breath sounds normal. No respiratory distress. He has no wheezes. He has no rales.  Abdominal: Soft. Bowel sounds are normal. He exhibits no distension. There is no tenderness. There is no rebound and no guarding.  Musculoskeletal: Normal range  of motion. He exhibits no edema.  Neurological: He is alert and oriented to person, place, and time. Coordination normal.  Skin: Skin is warm and dry. Rash noted. Rash is macular (Macular blanchable pink rash that in many places coalesces on his arms neck torso and some on his legs.). He is not diaphoretic.  Psychiatric: He has a normal mood and affect. His behavior is normal.  Nursing note and vitals reviewed.       Assessment & Plan:   Problem List Items Addressed This Visit    None    Visit Diagnoses    Viral gastroenteritis    -  Primary   Relevant Medications   ondansetron (ZOFRAN ODT) 4 MG disintegrating tablet   Viral exanthem       Relevant  Medications   ondansetron (ZOFRAN ODT) 4 MG disintegrating tablet       Follow up plan: Return if symptoms worsen or fail to improve.  Counseling provided for all of the vaccine components No orders of the defined types were placed in this encounter.   Arville CareJoshua Dettinger, MD [facility] 01/13/2017, 10:41 AM

## 2017-01-14 ENCOUNTER — Other Ambulatory Visit: Payer: Self-pay | Admitting: *Deleted

## 2017-01-14 MED ORDER — TRAZODONE HCL 150 MG PO TABS: ORAL_TABLET | ORAL | 1 refills | Status: DC

## 2017-01-26 ENCOUNTER — Other Ambulatory Visit: Payer: Self-pay | Admitting: Family Medicine

## 2017-01-27 NOTE — Telephone Encounter (Signed)
Please change to 5 mg pill 2 at a time with otherwise the same Sig etc.

## 2017-01-27 NOTE — Telephone Encounter (Signed)
No script for flexeril filled since June 2018.

## 2017-02-09 ENCOUNTER — Other Ambulatory Visit: Payer: Self-pay | Admitting: Family Medicine

## 2017-02-18 ENCOUNTER — Ambulatory Visit: Payer: BC Managed Care – PPO | Admitting: Family Medicine

## 2017-02-18 ENCOUNTER — Encounter: Payer: Self-pay | Admitting: Family Medicine

## 2017-02-18 VITALS — BP 131/80 | HR 77 | Temp 98.1°F | Ht 70.0 in | Wt 271.0 lb

## 2017-02-18 DIAGNOSIS — M545 Low back pain: Secondary | ICD-10-CM | POA: Diagnosis not present

## 2017-02-18 MED ORDER — METHYLPREDNISOLONE ACETATE 80 MG/ML IJ SUSP
80.0000 mg | Freq: Once | INTRAMUSCULAR | Status: AC
  Administered 2017-02-18: 80 mg via INTRAMUSCULAR

## 2017-02-18 MED ORDER — BENAZEPRIL HCL 10 MG PO TABS: 10.0000 mg | ORAL_TABLET | Freq: Every day | ORAL | 1 refills | Status: DC

## 2017-02-18 MED ORDER — PREDNISONE 10 MG (48) PO TBPK: ORAL_TABLET | ORAL | 0 refills | Status: DC

## 2017-02-18 NOTE — Progress Notes (Signed)
Subjective:  Patient ID: Brian Roach, male    DOB: [birth year]  Age: 56 y.o. MRN: [id number]  CC: No chief complaint on file.   HPI Brian Roach presents for back pain on the left side. Says it is killing  Him. It hurts to bend over. Onset 1 week ago. Increasing. Has had multiple back problems in the past.   Depression screen [facility] 2/9 01/13/2017 01/07/2017 12/24/2016  Decreased Interest 0 0 0  Down, Depressed, Hopeless 0 0 0  PHQ - 2 Score 0 0 0    History Chistopher has a past medical history of Diabetes mellitus without complication (HCC), Hyperlipidemia, Hypertension, and PE (pulmonary embolism).   He has a past surgical history that includes Back surgery; Knee surgery; Lung surgery; Anterior cruciate ligament repair (Right); Wisdom tooth extraction; and Quadriceps tendon repair (Left, 09/23/2016).   His family history includes Heart disease in his brother and father.He reports that  has never smoked. he has never used smokeless tobacco. He reports that he does not drink alcohol or use drugs.    ROS Review of Systems  Constitutional: Negative for chills, diaphoresis and fever.  HENT: Negative for rhinorrhea and sore throat.   Respiratory: Negative for cough and shortness of breath.   Cardiovascular: Negative for chest pain.  Gastrointestinal: Negative for abdominal pain.  Musculoskeletal: Positive for arthralgias and back pain. Negative for myalgias.  Skin: Negative for rash.  Neurological: Negative for weakness and headaches.    Unremarkable except as per HPI  Objective:  BP 131/80   Pulse 77   Temp 98.1 F (36.7 C) (Oral)   Ht 5\' 10"  (1.778 m)   Wt 271 lb (122.9 kg)   BMI 38.88 kg/m   BP Readings from Last 3 Encounters:  02/18/17 131/80  01/13/17 122/79  01/07/17 123/79    Wt Readings from Last 3 Encounters:  02/18/17 271 lb (122.9 kg)  01/13/17 263 lb (119.3 kg)  01/07/17 261 lb (118.4 kg)     Physical Exam  Constitutional: He is oriented to  person, place, and time. He appears well-developed and well-nourished. No distress.  HENT:  Head: Normocephalic and atraumatic.  Right Ear: External ear normal.  Left Ear: External ear normal.  Nose: Nose normal.  Mouth/Throat: Oropharynx is clear and moist.  Eyes: Conjunctivae and EOM are normal. Pupils are equal, round, and reactive to light.  Neck: Normal range of motion. Neck supple. No thyromegaly present.  Cardiovascular: Normal rate, regular rhythm and normal heart sounds.  No murmur heard. Pulmonary/Chest: Effort normal and breath sounds normal. No respiratory distress. He has no wheezes. He has no rales.  Abdominal: Soft. Bowel sounds are normal. He exhibits no distension. There is no tenderness.  Musculoskeletal: He exhibits tenderness. He exhibits no edema.  Lymphadenopathy:    He has no cervical adenopathy.  Neurological: He is alert and oriented to person, place, and time. He has normal reflexes. No cranial nerve deficit.  Skin: Skin is warm and dry.  Psychiatric: He has a normal mood and affect. His behavior is normal.      Assessment & Plan:   Diagnoses and all orders for this visit:  Low back pain, unspecified back pain laterality, unspecified chronicity, with sciatica presence unspecified -     methylPREDNISolone acetate (DEPO-MEDROL) injection 80 mg -     Ambulatory referral to Orthopedic Surgery  Other orders -     benazepril (LOTENSIN) 10 MG tablet; Take 1 tablet (10 mg total) by mouth daily. -  predniSONE (STERAPRED UNI-PAK 48 TAB) 10 MG (48) TBPK tablet; Take as directed       I have discontinued Titus Dubin. Minor's diltiazem. I am also having him start on predniSONE. Additionally, I am having him maintain his docusate sodium, Multiple Vitamins-Minerals (ONE-A-DAY MENS HEALTH FORMULA PO), apixaban, cetirizine, atorvastatin, metFORMIN, omeprazole, ondansetron, traZODone, and benazepril. We administered methylPREDNISolone acetate.  Allergies as of  02/18/2017   No Known Allergies     Medication List        Accurate as of 02/18/17 11:59 PM. Always use your most recent med list.          apixaban 5 MG Tabs tablet Commonly known as:  ELIQUIS Take 1 tablet (5 mg total) by mouth 2 (two) times daily.   atorvastatin 20 MG tablet Commonly known as:  LIPITOR Take 1 tablet (20 mg total) by mouth every evening.   benazepril 10 MG tablet Commonly known as:  LOTENSIN Take 1 tablet (10 mg total) by mouth daily.   cetirizine 10 MG tablet Commonly known as:  ZYRTEC Take 1 tablet (10 mg total) by mouth daily. For allergy symptoms   diltiazem 360 MG 24 hr capsule Commonly known as:  CARDIZEM CD Take 1 capsule (360 mg total) by mouth daily.   docusate sodium 100 MG capsule Commonly known as:  COLACE Take 200 mg by mouth 2 (two) times daily.   metFORMIN 500 MG 24 hr tablet Commonly known as:  GLUCOPHAGE-XR Take 1 tablet (500 mg total) by mouth daily with breakfast.   omeprazole 20 MG capsule Commonly known as:  PRILOSEC Take 1 capsule (20 mg total) by mouth daily.   ondansetron 4 MG disintegrating tablet Commonly known as:  ZOFRAN ODT Take 1 tablet (4 mg total) every 8 (eight) hours as needed by mouth for nausea or vomiting.   ONE-A-DAY MENS HEALTH FORMULA PO Take 1 tablet by mouth daily.   predniSONE 10 MG (48) Tbpk tablet Commonly known as:  STERAPRED UNI-PAK 48 TAB Take as directed   traZODone 150 MG tablet Commonly known as:  DESYREL USE FROM 1/3 TO 1 TABLET NIGHTLY BY MOUTH AS NEEDED FOR SLEEP.        Follow-up: Return if symptoms worsen or fail to improve.  [name], M.D.

## 2017-02-23 ENCOUNTER — Telehealth: Payer: Self-pay | Admitting: Family Medicine

## 2017-02-23 ENCOUNTER — Other Ambulatory Visit: Payer: Self-pay | Admitting: Family Medicine

## 2017-02-23 NOTE — Telephone Encounter (Signed)
Rx sent 

## 2017-02-23 NOTE — Telephone Encounter (Signed)
What is the name of the medication? diltiazem (CARDIZEM CD) 360 MG 24 hr capsule    Have you contacted your pharmacy to request a refill? yes  [facility]   Patient notified that their request is being sent to the clinical staff for review and that they should receive a call once it is complete. If they do not receive a call within 24 hours they can check with their pharmacy or our office.

## 2017-03-01 ENCOUNTER — Encounter: Payer: Self-pay | Admitting: Family Medicine

## 2017-03-03 ENCOUNTER — Ambulatory Visit (INDEPENDENT_AMBULATORY_CARE_PROVIDER_SITE_OTHER): Payer: Self-pay

## 2017-03-03 ENCOUNTER — Ambulatory Visit (INDEPENDENT_AMBULATORY_CARE_PROVIDER_SITE_OTHER): Payer: BC Managed Care – PPO | Admitting: Orthopaedic Surgery

## 2017-03-03 ENCOUNTER — Encounter (INDEPENDENT_AMBULATORY_CARE_PROVIDER_SITE_OTHER): Payer: Self-pay | Admitting: Orthopaedic Surgery

## 2017-03-03 VITALS — BP 129/87 | HR 93 | Ht 70.0 in | Wt 270.0 lb

## 2017-03-03 DIAGNOSIS — M545 Low back pain, unspecified: Secondary | ICD-10-CM

## 2017-03-03 NOTE — Progress Notes (Signed)
Office Visit Note   Patient: Brian Roach           Date of Birth: 1960-07-29           MRN: [id number] Visit Date: 03/03/2017              Requested by: [name], MD [address] [place], [place] [address] PCP: [name], MD   Assessment & Plan: Visit Diagnoses:  1. Midline low back pain without sciatica, unspecified chronicity     Plan: Patient had previous lumbar surgery x2.  Gotten somewhat better with the prednisone.  Treated for DVT and pulmonary embolism.  Negative nerve root tension signs if he develops progressive weakness or inability to walk then we can proceed with MRI scan.  He is recently gotten some improvement plan to recheck him again in 2 months. He   Possibly might  have had epidural hematoma from blood thinner but his   symptoms are improving.  If he gets worse he will call.  Follow-Up Instructions: No Follow-up on file.   Orders:  Orders Placed This Encounter  Procedures  . XR Lumbar Spine 2-3 Views   No orders of the defined types were placed in this encounter.     Procedures: No procedures performed   Clinical Data: No additional findings.   Subjective: Chief Complaint  Patient presents with  . Lower Back - Pain    HPI 57 year old male returns with back pain that started after doing some leg squat exercises for quad strengthening post left quadriceps repair.  States she is always had some low back pain but states is been worse in the last 3 weeks.  With difficulty getting dressed going to the bathroom wiping himself.  He has left side radicular symptoms or radiate down to the calf.  He gets some relief with prednisone.  Quadriceps tendon repair 09/21/2016.  Denies numbness or tingling no associated bowel or bladder symptoms.  He is on blood thinners for DVT with pulmonary embolism.  Back surgeries x2 the first procedure at age 70.  Review of Systems positive for 2 previous lumbar surgeries knee surgery with left quadriceps tendon repair  09/17/2016 Dr. Romeo Apple.  Positive for history of blood clots DVT, diabetes, hypertension, pulmonary embolism. Positive  for obesity, hypertension, hyperlipidemia.  Previous ACL repair right knee.  Wisdom teeth surgery.  History of SVT.  14 point review of systems otherwise negative as it pertains HPI.   Objective: Vital Signs: BP 129/87   Pulse 93   Ht 5\' 10"  (1.778 m)   Wt 270 lb (122.5 kg)   BMI 38.74 kg/m   Physical Exam  Constitutional: He is oriented to person, place, and time. He appears well-developed and well-nourished.  HENT:  Head: Normocephalic and atraumatic.  Eyes: EOM are normal. Pupils are equal, round, and reactive to light.  Neck: No tracheal deviation present. No thyromegaly present.  Cardiovascular: Normal rate.  Pulmonary/Chest: Effort normal. He has no wheezes.  Abdominal: Soft. Bowel sounds are normal.  Neurological: He is alert and oriented to person, place, and time.  Skin: Skin is warm and dry. Capillary refill takes less than 2 seconds.  Psychiatric: He has a normal mood and affect. His behavior is normal. Judgment and thought content normal.    Ortho Exam well-healed midline incision from last quadriceps tendon repair.  Negative logroll the hips.  Knees reach full extension.  Right ACL scars negative anterior drawer.  Anterior tib EHL is intact.  Specialty Comments:  No specialty comments available.  Imaging: No results found.   PMFS History: Patient Active Problem List   Diagnosis Date Noted  . Paroxysmal SVT (supraventricular tachycardia) (HCC) 12/23/2016  . Palpitations   . History of pulmonary embolus (PE) 12/22/2016  . Chest pain 12/21/2016  . Acute pulmonary embolism (HCC) 09/28/2016  . Quadriceps muscle rupture, left, initial encounter 09/23/2016  . Knee injury 09/22/2016  . Diabetes mellitus without complication (HCC) 06/17/2016  . DM (diabetes mellitus), type 2, uncontrolled (HCC) 11/17/2015  . Sciatica, right side 11/17/2015  .  Allergic rhinitis 02/19/2015  . Irritable bowel syndrome 02/19/2015  . Left shoulder pain 05/27/2012  . History of pulmonary embolism 05/27/2012  . Obesity 05/27/2012  . HTN (hypertension), benign 05/27/2012  . Hypercholesteremia 05/27/2012   Past Medical History:  Diagnosis Date  . Diabetes mellitus without complication (HCC)   . Hyperlipidemia   . Hypertension   . PE (pulmonary embolism)     Family History  Problem Relation Age of Onset  . Heart disease Father   . Heart disease Brother     Past Surgical History:  Procedure Laterality Date  . ANTERIOR CRUCIATE LIGAMENT REPAIR Right   . BACK SURGERY    . KNEE SURGERY    . LUNG SURGERY     Unspecified but was done for PE. Had chest tubes. Approx 2003  . QUADRICEPS TENDON REPAIR Left 09/23/2016   Procedure: REPAIR QUADRICEP TENDON;  Surgeon: Vickki HearingHarrison, Stanley E, MD;  Location: [facility];  Service: Orthopedics;  Laterality: Left;  . WISDOM TOOTH EXTRACTION     Social History   Occupational History  . Not on file  Tobacco Use  . Smoking status: Never Smoker  . Smokeless tobacco: Never Used  Substance and Sexual Activity  . Alcohol use: No  . Drug use: No  . Sexual activity: Yes

## 2017-03-06 ENCOUNTER — Encounter (INDEPENDENT_AMBULATORY_CARE_PROVIDER_SITE_OTHER): Payer: Self-pay | Admitting: Orthopaedic Surgery

## 2017-03-28 ENCOUNTER — Ambulatory Visit: Payer: BC Managed Care – PPO | Admitting: Family Medicine

## 2017-03-28 ENCOUNTER — Encounter: Payer: Self-pay | Admitting: Family Medicine

## 2017-03-28 VITALS — BP 108/69 | HR 81 | Ht 70.0 in | Wt 271.0 lb

## 2017-03-28 DIAGNOSIS — E119 Type 2 diabetes mellitus without complications: Secondary | ICD-10-CM

## 2017-03-28 DIAGNOSIS — I2699 Other pulmonary embolism without acute cor pulmonale: Secondary | ICD-10-CM | POA: Diagnosis not present

## 2017-03-28 LAB — CMP14+EGFR
ALT: 27 IU/L (ref 0–44)
AST: 21 IU/L (ref 0–40)
Albumin/Globulin Ratio: 1.7 (ref 1.2–2.2)
Albumin: 4.5 g/dL (ref 3.5–5.5)
Alkaline Phosphatase: 69 IU/L (ref 39–117)
BUN/Creatinine Ratio: 21 — ABNORMAL HIGH (ref 9–20)
BUN: 18 mg/dL (ref 6–24)
Bilirubin Total: 0.4 mg/dL (ref 0.0–1.2)
CALCIUM: 9.4 mg/dL (ref 8.7–10.2)
CO2: 21 mmol/L (ref 20–29)
Chloride: 101 mmol/L (ref 96–106)
Creatinine, Ser: 0.86 mg/dL (ref 0.76–1.27)
GFR calc Af Amer: 110 mL/min/{1.73_m2} (ref >59)
GFR, EST NON AFRICAN AMERICAN: 96 mL/min/{1.73_m2} (ref >59)
GLOBULIN, TOTAL: 2.7 g/dL (ref 1.5–4.5)
Glucose: 120 mg/dL — ABNORMAL HIGH (ref 65–99)
Potassium: 4.6 mmol/L (ref 3.5–5.2)
SODIUM: 141 mmol/L (ref 134–144)
Total Protein: 7.2 g/dL (ref 6.0–8.5)

## 2017-03-28 LAB — BAYER DCA HB A1C WAIVED: HB A1C (BAYER DCA - WAIVED): 6.3 % (ref <7.0)

## 2017-03-28 MED ORDER — OMEPRAZOLE 20 MG PO CPDR: 20.0000 mg | DELAYED_RELEASE_CAPSULE | Freq: Every day | ORAL | 1 refills | Status: DC

## 2017-03-28 MED ORDER — TRAZODONE HCL 300 MG PO TABS: 300.0000 mg | ORAL_TABLET | Freq: Every day | ORAL | 2 refills | Status: DC

## 2017-03-28 MED ORDER — ATORVASTATIN CALCIUM 20 MG PO TABS: 20.0000 mg | ORAL_TABLET | Freq: Every evening | ORAL | 1 refills | Status: DC

## 2017-03-28 MED ORDER — CYCLOBENZAPRINE HCL 10 MG PO TABS: 10.0000 mg | ORAL_TABLET | Freq: Three times a day (TID) | ORAL | 0 refills | Status: DC | PRN

## 2017-03-28 NOTE — Progress Notes (Signed)
Subjective:  Patient ID: Brian Roach,  male    DOB: [birth year]  Age: 57 y.o.    CC: Diabetes (pt here today for routine follow up of his chronic medical conditions)   HPI Brian Roach presents for  follow-up of hypertension. Patient has no history of headache chest pain or shortness of breath or recent cough. Patient also denies symptoms of TIA such as numbness weakness lateralizing. Patient checks  blood pressure at home. Recent readings have been good Patient denies side effects from medication. States taking it regularly.  Patient also  in for follow-up of elevated cholesterol. Doing well without complaints on current medication. Denies side effects of statin including myalgia and arthralgia and nausea. Also in today for liver function testing. Currently no chest pain, shortness of breath or other cardiovascular related symptoms noted.  Follow-up of diabetes. Patient does check blood sugar at home. Readings run between 115 and 120 Patient denies symptoms such as polyuria, polydipsia, excessive hunger, nausea No significant hypoglycemic spells noted. Medications reviewed. Pt reports taking them regularly. Pt. denies complication/adverse reaction today.   Patient had pulmonary embolism 6 months ago he continues to take the Eliquis.  He wonders if he can come off of the Eliquis soon since it has been 6 months as of this week.   History Brian Roach has a past medical history of Diabetes mellitus without complication ([place]), Hyperlipidemia, Hypertension, and PE (pulmonary embolism).   He has a past surgical history that includes Back surgery; Knee surgery; Lung surgery; Anterior cruciate ligament repair (Right); Wisdom tooth extraction; and Quadriceps tendon repair (Left, 09/23/2016).   His family history includes Heart disease in his brother and father.He reports that  has never smoked. he has never used smokeless tobacco. He reports that he does not drink alcohol or use  drugs.  Current Outpatient Medications on File Prior to Visit  Medication Sig Dispense Refill  . apixaban (ELIQUIS) 5 MG TABS tablet Take 1 tablet (5 mg total) by mouth 2 (two) times daily. 60 tablet 5  . benazepril (LOTENSIN) 10 MG tablet Take 1 tablet (10 mg total) by mouth daily. 90 tablet 1  . cetirizine (ZYRTEC) 10 MG tablet Take 1 tablet (10 mg total) by mouth daily. For allergy symptoms 90 tablet 3  . diltiazem (CARDIZEM CD) 360 MG 24 hr capsule TAKE 1 CAPSULE (360 MG TOTAL) BY MOUTH DAILY. 30 capsule 5  . docusate sodium (COLACE) 100 MG capsule Take 200 mg by mouth 2 (two) times daily.    . metFORMIN (GLUCOPHAGE-XR) 500 MG 24 hr tablet Take 1 tablet (500 mg total) by mouth daily with breakfast. 90 tablet 3  . Multiple Vitamins-Minerals (ONE-A-DAY MENS HEALTH FORMULA PO) Take 1 tablet by mouth daily.     No current facility-administered medications on file prior to visit.     ROS Review of Systems  Constitutional: Negative for chills, diaphoresis, fever and unexpected weight change.  HENT: Negative for congestion, hearing loss, rhinorrhea and sore throat.   Eyes: Negative for visual disturbance.  Respiratory: Negative for cough and shortness of breath.   Cardiovascular: Negative for chest pain.  Gastrointestinal: Negative for abdominal pain, constipation and diarrhea.  Genitourinary: Negative for dysuria and flank pain.  Musculoskeletal: Positive for back pain (Bilateral lower lumbar and a band across the L5-S1 region to the lateral aspect of each side). Negative for arthralgias and joint swelling.  Skin: Negative for rash.  Neurological: Negative for dizziness and headaches.  Psychiatric/Behavioral: Positive for sleep disturbance (  Patient states that his pain is interfering with his sleep.  He awakens several times during the night.  He is taking the trazodone and it helps some.). Negative for dysphoric mood.    Objective:  BP 108/69   Pulse 81   Ht 5' 10" (1.778 m)   Wt 271  lb (122.9 kg)   BMI 38.88 kg/m   BP Readings from Last 3 Encounters:  03/28/17 108/69  03/03/17 129/87  02/18/17 131/80    Wt Readings from Last 3 Encounters:  03/28/17 271 lb (122.9 kg)  03/03/17 270 lb (122.5 kg)  02/18/17 271 lb (122.9 kg)     Physical Exam  Constitutional: He is oriented to person, place, and time. He appears well-developed and well-nourished. No distress.  HENT:  Head: Normocephalic and atraumatic.  Right Ear: External ear normal.  Left Ear: External ear normal.  Nose: Nose normal.  Mouth/Throat: Oropharynx is clear and moist.  Eyes: Conjunctivae and EOM are normal. Pupils are equal, round, and reactive to light.  Neck: Normal range of motion. Neck supple. No thyromegaly present.  Cardiovascular: Normal rate, regular rhythm and normal heart sounds.  No murmur heard. Pulmonary/Chest: Effort normal and breath sounds normal. No respiratory distress. He has no wheezes. He has no rales.  Abdominal: Soft. Bowel sounds are normal. He exhibits no distension. There is no tenderness.  Lymphadenopathy:    He has no cervical adenopathy.  Neurological: He is alert and oriented to person, place, and time. He has normal reflexes.  Skin: Skin is warm and dry.  Psychiatric: He has a normal mood and affect. His behavior is normal. Judgment and thought content normal.    Diabetic Foot Exam - Simple   No data filed        Assessment & Plan:   Brian Roach was seen today for diabetes.  Diagnoses and all orders for this visit:  Diabetes mellitus without complication ([place]) -     CMP14+EGFR -     Bayer DCA Hb A1c Waived  Other acute pulmonary embolism without acute cor pulmonale (HCC) -     CT ANGIO CHEST PE W OR WO CONTRAST; Future  Other orders -     atorvastatin (LIPITOR) 20 MG tablet; Take 1 tablet (20 mg total) by mouth every evening. -     omeprazole (PRILOSEC) 20 MG capsule; Take 1 capsule (20 mg total) by mouth daily. -     trazodone (DESYREL) 300 MG  tablet; Take 1 tablet (300 mg total) by mouth at bedtime. -     cyclobenzaprine (FLEXERIL) 10 MG tablet; Take 1 tablet (10 mg total) by mouth 3 (three) times daily as needed for muscle spasms.   I have discontinued Brian Roach. Brian Roach's ondansetron, traZODone, and predniSONE. I am also having him start on trazodone and cyclobenzaprine. Additionally, I am having him maintain his docusate sodium, Multiple Vitamins-Minerals (ONE-A-DAY MENS HEALTH FORMULA PO), apixaban, cetirizine, metFORMIN, benazepril, diltiazem, atorvastatin, and omeprazole.  Meds ordered this encounter  Medications  . atorvastatin (LIPITOR) 20 MG tablet    Sig: Take 1 tablet (20 mg total) by mouth every evening.    Dispense:  90 tablet    Refill:  1  . omeprazole (PRILOSEC) 20 MG capsule    Sig: Take 1 capsule (20 mg total) by mouth daily.    Dispense:  90 capsule    Refill:  1  . trazodone (DESYREL) 300 MG tablet    Sig: Take 1 tablet (300 mg total) by mouth at bedtime.  Dispense:  30 tablet    Refill:  2  . cyclobenzaprine (FLEXERIL) 10 MG tablet    Sig: Take 1 tablet (10 mg total) by mouth 3 (three) times daily as needed for muscle spasms.    Dispense:  90 tablet    Refill:  0     Follow-up: Return in about 3 months (around 06/26/2017).  Claretta Fraise, M.D.

## 2017-03-28 NOTE — Patient Instructions (Signed)

## 2017-03-30 ENCOUNTER — Ambulatory Visit: Payer: BC Managed Care – PPO | Admitting: Orthopedic Surgery

## 2017-03-30 ENCOUNTER — Encounter: Payer: Self-pay | Admitting: Orthopedic Surgery

## 2017-03-30 VITALS — BP 135/85 | HR 94 | Ht 70.0 in | Wt 271.0 lb

## 2017-03-30 DIAGNOSIS — S76112D Strain of left quadriceps muscle, fascia and tendon, subsequent encounter: Secondary | ICD-10-CM | POA: Diagnosis not present

## 2017-03-30 NOTE — Progress Notes (Signed)
error 

## 2017-03-30 NOTE — Progress Notes (Signed)
Routine follow-up  Chief Complaint  Patient presents with  . Post-op Follow-up    left quad repair 09/23/16    Encounter Diagnosis  Name Primary?  . Quadriceps muscle rupture, left, subsequent encounter Yes    Left quadriceps repair 6 months ago complicated by pulmonary embolus treated  He complains that his knee exercises caused him to have some back pain so he backed off of those for little while and the back pain resolved.  He does report improving strength in the left knee  He can fully extend the knee there is no weakness to manual muscle testing is flexion is excellent as well  He will be discharged as far as the knee surgery is concerned

## 2017-04-27 ENCOUNTER — Other Ambulatory Visit: Payer: Self-pay | Admitting: *Deleted

## 2017-04-27 MED ORDER — TRAZODONE HCL 300 MG PO TABS: 300.0000 mg | ORAL_TABLET | Freq: Every day | ORAL | 2 refills | Status: DC

## 2017-05-03 ENCOUNTER — Other Ambulatory Visit: Payer: Self-pay | Admitting: *Deleted

## 2017-05-03 MED ORDER — TRAZODONE HCL 300 MG PO TABS: 300.0000 mg | ORAL_TABLET | Freq: Every day | ORAL | 0 refills | Status: DC

## 2017-05-03 NOTE — Telephone Encounter (Signed)
Fax received 90d request Original Rx from 04/27/17 was for 30 days w/ 2 RFs Resent for #90 with no RFs

## 2017-05-05 ENCOUNTER — Ambulatory Visit (INDEPENDENT_AMBULATORY_CARE_PROVIDER_SITE_OTHER): Payer: BC Managed Care – PPO | Admitting: Orthopaedic Surgery

## 2017-05-05 ENCOUNTER — Other Ambulatory Visit: Payer: Self-pay | Admitting: Family Medicine

## 2017-05-19 ENCOUNTER — Telehealth: Payer: Self-pay

## 2017-05-19 NOTE — Telephone Encounter (Signed)
Patient calling because he had never heard why his CT chest was never done (Told him insurance had denied)   He still would like to see if he can come off Eliquis after a year of no blood clots in lungs

## 2017-05-23 NOTE — Telephone Encounter (Signed)
Patient states that he is going to continue the eliquis until his follow up appt with [facility] on 4/29 and he will discuss coming off of it then.

## 2017-05-23 NOTE — Telephone Encounter (Signed)
He should be okay without the medicine.

## 2017-05-28 ENCOUNTER — Other Ambulatory Visit: Payer: Self-pay | Admitting: Family Medicine

## 2017-06-06 ENCOUNTER — Other Ambulatory Visit: Payer: Self-pay | Admitting: Family Medicine

## 2017-06-10 ENCOUNTER — Ambulatory Visit: Payer: BC Managed Care – PPO | Admitting: Physician Assistant

## 2017-06-10 ENCOUNTER — Encounter: Payer: Self-pay | Admitting: Physician Assistant

## 2017-06-10 VITALS — BP 123/76 | HR 80 | Temp 97.0°F | Ht 70.0 in | Wt 276.0 lb

## 2017-06-10 DIAGNOSIS — R21 Rash and other nonspecific skin eruption: Secondary | ICD-10-CM | POA: Diagnosis not present

## 2017-06-10 DIAGNOSIS — L239 Allergic contact dermatitis, unspecified cause: Secondary | ICD-10-CM | POA: Diagnosis not present

## 2017-06-10 DIAGNOSIS — W57XXXA Bitten or stung by nonvenomous insect and other nonvenomous arthropods, initial encounter: Secondary | ICD-10-CM | POA: Diagnosis not present

## 2017-06-10 DIAGNOSIS — J309 Allergic rhinitis, unspecified: Secondary | ICD-10-CM | POA: Diagnosis not present

## 2017-06-10 MED ORDER — DOXYCYCLINE HYCLATE 100 MG PO TABS
100.0000 mg | ORAL_TABLET | Freq: Two times a day (BID) | ORAL | 0 refills | Status: DC
Start: 2017-06-10 — End: 2017-06-27

## 2017-06-10 MED ORDER — METHYLPREDNISOLONE ACETATE 80 MG/ML IJ SUSP
80.0000 mg | Freq: Once | INTRAMUSCULAR | Status: AC
  Administered 2017-06-10: 80 mg via INTRAMUSCULAR

## 2017-06-10 NOTE — Patient Instructions (Signed)
Contact Dermatitis Dermatitis is redness, soreness, and swelling (inflammation) of the skin. Contact dermatitis is a reaction to certain substances that touch the skin. You either touched something that irritated your skin, or you have allergies to something you touched. Follow these instructions at home: Skin Care  Moisturize your skin as needed.  Apply cool compresses to the affected areas.  Try taking a bath with: ? Epsom salts. Follow the instructions on the package. You can get these at a pharmacy or grocery store. ? Baking soda. Pour a small amount into the bath as told by your doctor. ? Colloidal oatmeal. Follow the instructions on the package. You can get this at a pharmacy or grocery store.  Try applying baking soda paste to your skin. Stir water into baking soda until it looks like paste.  Do not scratch your skin.  Bathe less often.  Bathe in lukewarm water. Avoid using hot water. Medicines  Take or apply over-the-counter and prescription medicines only as told by your doctor.  If you were prescribed an antibiotic medicine, take or apply your antibiotic as told by your doctor. Do not stop taking the antibiotic even if your condition starts to get better. General instructions  Keep all follow-up visits as told by your doctor. This is important.  Avoid the substance that caused your reaction. If you do not know what caused it, keep a journal to try to track what caused it. Write down: ? What you eat. ? What cosmetic products you use. ? What you drink. ? What you wear in the affected area. This includes jewelry.  If you were given a bandage (dressing), take care of it as told by your doctor. This includes when to change and remove it. Contact a doctor if:  You do not get better with treatment.  Your condition gets worse.  You have signs of infection such as: ? Swelling. ? Tenderness. ? Redness. ? Soreness. ? Warmth.  You have a fever.  You have new  symptoms. Get help right away if:  You have a very bad headache.  You have neck pain.  Your neck is stiff.  You throw up (vomit).  You feel very sleepy.  You see red streaks coming from the affected area.  Your bone or joint underneath the affected area becomes painful after the skin has healed.  The affected area turns darker.  You have trouble breathing. This information is not intended to replace advice given to you by your health care provider. Make sure you discuss any questions you have with your health care provider. Document Released: 12/13/2008 Document Revised: 07/24/2015 Document Reviewed: 07/03/2014 Elsevier Interactive Patient Education  2018 [employer] Inc.  

## 2017-06-10 NOTE — Progress Notes (Signed)
.    BP 123/76   Pulse 80   Temp (!) 97 F (36.1 C) (Oral)   Ht 5\' 10"  (1.778 m)   Wt 276 lb (125.2 kg)   BMI 39.60 kg/m    Subjective:    Patient ID: Brian BuffWilliam F Solecki, male    DOB: 05/28/1960, 57 y.o.   MRN: [id number]  HPI: Brian Roach is a 57 y.o. male presenting on 06/10/2017 for Rash (back and side )  Patient comes in with a 2-day history of a fine red rash on his back.  There is significant itching associated with that he did have a tick bite a couple weeks ago on his right hip.  There is a little bit of a target around it but is approximately 3 cm in diameter.  The central bite is still present.  He denies any fever, chills, headache.  He does not know of anything new that he is gotten into to be allergic to.  He has never had a rash like this before  Past Medical History:  Diagnosis Date  . Diabetes mellitus without complication (HCC)   . Hyperlipidemia   . Hypertension   . PE (pulmonary embolism)    Relevant past medical, surgical, family and social history reviewed and updated as indicated. Interim medical history since our last visit reviewed. Allergies and medications reviewed and updated. DATA REVIEWED: CHART IN EPIC  Family History reviewed for pertinent findings.  Review of Systems  Constitutional: Negative.  Negative for appetite change and fatigue.  HENT: Negative.   Eyes: Negative.  Negative for pain and visual disturbance.  Respiratory: Negative.  Negative for cough, chest tightness, shortness of breath and wheezing.   Cardiovascular: Negative.  Negative for chest pain, palpitations and leg swelling.  Gastrointestinal: Negative.  Negative for abdominal pain, diarrhea, nausea and vomiting.  Endocrine: Negative.   Genitourinary: Negative.   Musculoskeletal: Negative.   Skin: Positive for color change and rash.  Neurological: Negative.  Negative for weakness, numbness and headaches.  Psychiatric/Behavioral: Negative.     Allergies as of 06/10/2017     No Known Allergies     Medication List        Accurate as of 06/10/17  9:07 AM. Always use your most recent med list.          apixaban 5 MG Tabs tablet Commonly known as:  ELIQUIS Take 1 tablet (5 mg total) by mouth 2 (two) times daily.   atorvastatin 20 MG tablet Commonly known as:  LIPITOR Take 1 tablet (20 mg total) by mouth every evening.   benazepril 10 MG tablet Commonly known as:  LOTENSIN TAKE 1 TABLET BY MOUTH EVERY DAY   cetirizine 10 MG tablet Commonly known as:  ZYRTEC Take 1 tablet (10 mg total) by mouth daily. For allergy symptoms   cyclobenzaprine 10 MG tablet Commonly known as:  FLEXERIL TAKE 1 TABLET BY MOUTH THREE TIMES A DAY AS NEEDED FOR MUSCLE SPASMS   diltiazem 360 MG 24 hr capsule Commonly known as:  CARDIZEM CD TAKE 1 CAPSULE (360 MG TOTAL) BY MOUTH DAILY.   docusate sodium 100 MG capsule Commonly known as:  COLACE Take 200 mg by mouth 2 (two) times daily.   doxycycline 100 MG tablet Commonly known as:  VIBRA-TABS Take 1 tablet (100 mg total) by mouth 2 (two) times daily. 1 po bid   metFORMIN 500 MG 24 hr tablet Commonly known as:  GLUCOPHAGE-XR Take 1 tablet (500 mg total) by mouth daily  with breakfast.   omeprazole 20 MG capsule Commonly known as:  PRILOSEC Take 1 capsule (20 mg total) by mouth daily.   ONE-A-DAY MENS HEALTH FORMULA PO Take 1 tablet by mouth daily.   trazodone 300 MG tablet Commonly known as:  DESYREL Take 1 tablet (300 mg total) by mouth at bedtime.          Objective:    BP 123/76   Pulse 80   Temp (!) 97 F (36.1 C) (Oral)   Ht 5\' 10"  (1.778 m)   Wt 276 lb (125.2 kg)   BMI 39.60 kg/m   No Known Allergies  Wt Readings from Last 3 Encounters:  06/10/17 276 lb (125.2 kg)  03/30/17 271 lb (122.9 kg)  03/28/17 271 lb (122.9 kg)    Physical Exam  Constitutional: He appears well-developed and well-nourished. No distress.  HENT:  Head: Normocephalic and atraumatic.  Eyes: Pupils are equal,  round, and reactive to light. Conjunctivae and EOM are normal.  Cardiovascular: Normal rate, regular rhythm and normal heart sounds.  Pulmonary/Chest: Effort normal and breath sounds normal. No respiratory distress.  Skin: Skin is warm and dry.  Fine rash present on the back and sides of trunk.  There is redness.  There is a tick bite on the right hip with surrounding erythema.  He is beginning to have slight rash on the lower legs.  Psychiatric: He has a normal mood and affect. His behavior is normal.  Nursing note and vitals reviewed.       Assessment & Plan:   1. Allergic rhinitis, unspecified seasonality, unspecified trigger  2. Allergic dermatitis - methylPREDNISolone acetate (DEPO-MEDROL) injection 80 mg  3. Tick bite, initial encounter  4. Rash - methylPREDNISolone acetate (DEPO-MEDROL) injection 80 mg - doxycycline (VIBRA-TABS) 100 MG tablet; Take 1 tablet (100 mg total) by mouth 2 (two) times daily. 1 po bid  Dispense: 20 tablet; Refill: 0   Continue all other maintenance medications as listed above.  Follow up plan: Return if symptoms worsen or fail to improve.  Educational handout given for allergic dermatitis  Remus Loffler PA-C [facility] [address]  [place], [place] [address] [phone]   06/10/2017, 9:07 AM

## 2017-06-27 ENCOUNTER — Ambulatory Visit: Payer: BC Managed Care – PPO | Admitting: Family Medicine

## 2017-06-27 ENCOUNTER — Encounter: Payer: Self-pay | Admitting: Family Medicine

## 2017-06-27 VITALS — BP 118/77 | HR 81 | Temp 97.0°F | Ht 70.0 in | Wt 274.2 lb

## 2017-06-27 DIAGNOSIS — Z86711 Personal history of pulmonary embolism: Secondary | ICD-10-CM | POA: Diagnosis not present

## 2017-06-27 DIAGNOSIS — N400 Enlarged prostate without lower urinary tract symptoms: Secondary | ICD-10-CM | POA: Diagnosis not present

## 2017-06-27 DIAGNOSIS — Z23 Encounter for immunization: Secondary | ICD-10-CM | POA: Diagnosis not present

## 2017-06-27 DIAGNOSIS — I10 Essential (primary) hypertension: Secondary | ICD-10-CM | POA: Diagnosis not present

## 2017-06-27 DIAGNOSIS — E7800 Pure hypercholesterolemia, unspecified: Secondary | ICD-10-CM | POA: Diagnosis not present

## 2017-06-27 DIAGNOSIS — I471 Supraventricular tachycardia: Secondary | ICD-10-CM | POA: Diagnosis not present

## 2017-06-27 DIAGNOSIS — E119 Type 2 diabetes mellitus without complications: Secondary | ICD-10-CM | POA: Diagnosis not present

## 2017-06-27 LAB — BAYER DCA HB A1C WAIVED: HB A1C: 6.1 % (ref <7.0)

## 2017-06-27 NOTE — Progress Notes (Signed)
Subjective:  Patient ID: Brian Roach,  male    DOB: 1960-05-05  Age: 57 y.o.    CC: Diabetes (pt here today for routine follow up of Brian Roach chronic medical conditions )   HPI Brian Roach presents for  follow-up of hypertension. Patient Roach no history of headache chest pain or shortness of breath or recent cough. Patient also denies symptoms of TIA such as numbness weakness lateralizing. Patient denies side effects from medication. States taking it regularly.  Patient also  in for follow-up of elevated cholesterol. Doing well without complaints on current medication. Denies side effects  including myalgia and nausea. Also in today for liver function testing. Currently no chest pain, shortness of breath or other cardiovascular related symptoms noted.  Follow-up of diabetes. Patient does check blood sugar at home. Readings run between 95-100 fasting and <125 PP Patient denies symptoms such as excessive hunger or urinary frequency, excessive hunger, nausea No significant hypoglycemic spells noted. Medications reviewed. Pt reports taking them regularly. Pt. denies complication/adverse reaction today.    History Brian Roach a past medical history of Diabetes mellitus without complication ([place]), Hyperlipidemia, Hypertension, and PE (pulmonary embolism).   Brian Roach Roach a past surgical history that includes Back surgery; Knee surgery; Lung surgery; Anterior cruciate ligament repair (Right); Wisdom tooth extraction; and Quadriceps tendon repair (Left, 09/23/2016).   Brian Roach family history includes Heart disease in Brian Roach brother and father.Brian Roach reports that Brian Roach Roach never smoked. Brian Roach Roach never used smokeless tobacco. Brian Roach reports that Brian Roach does not drink alcohol or use drugs.  Current Outpatient Medications on File Prior to Visit  Medication Sig Dispense Refill  . apixaban (ELIQUIS) 5 MG TABS tablet Take 1 tablet (5 mg total) by mouth 2 (two) times daily. 60 tablet 5  . atorvastatin (LIPITOR) 20 MG tablet Take  1 tablet (20 mg total) by mouth every evening. 90 tablet 1  . benazepril (LOTENSIN) 10 MG tablet TAKE 1 TABLET BY MOUTH EVERY DAY 90 tablet 1  . cetirizine (ZYRTEC) 10 MG tablet Take 1 tablet (10 mg total) by mouth daily. For allergy symptoms 90 tablet 3  . cyclobenzaprine (FLEXERIL) 10 MG tablet TAKE 1 TABLET BY MOUTH THREE TIMES A DAY AS NEEDED FOR MUSCLE SPASMS 90 tablet 0  . diltiazem (CARDIZEM CD) 360 MG 24 hr capsule TAKE 1 CAPSULE (360 MG TOTAL) BY MOUTH DAILY. 30 capsule 5  . docusate sodium (COLACE) 100 MG capsule Take 200 mg by mouth 2 (two) times daily.    . metFORMIN (GLUCOPHAGE-XR) 500 MG 24 hr tablet Take 1 tablet (500 mg total) by mouth daily with breakfast. 90 tablet 3  . Multiple Vitamins-Minerals (ONE-A-DAY MENS HEALTH FORMULA PO) Take 1 tablet by mouth daily.    Marland Kitchen omeprazole (PRILOSEC) 20 MG capsule Take 1 capsule (20 mg total) by mouth daily. 90 capsule 1  . trazodone (DESYREL) 300 MG tablet Take 1 tablet (300 mg total) by mouth at bedtime. 90 tablet 0   No current facility-administered medications on file prior to visit.     ROS Review of Systems  Constitutional: Negative.   HENT: Negative.   Eyes: Negative for visual disturbance.  Respiratory: Negative for cough and shortness of breath.   Cardiovascular: Negative for chest pain and leg swelling.  Gastrointestinal: Negative for abdominal pain, diarrhea, nausea and vomiting.  Genitourinary: Negative for difficulty urinating.  Musculoskeletal: Positive for arthralgias (Mostly with abducted ext. rotational endpoint). Negative for myalgias.  Skin: Negative for rash.  Neurological: Negative for headaches.  Psychiatric/Behavioral: Negative for sleep disturbance.    Objective:  BP 118/77   Pulse 81   Temp (!) 97 F (36.1 C) (Oral)   Ht '5\' 10"'$  (1.778 m)   Wt 274 lb 4 oz (124.4 kg)   BMI 39.35 kg/m   BP Readings from Last 3 Encounters:  06/27/17 118/77  06/10/17 123/76  03/30/17 135/85    Wt Readings from Last  3 Encounters:  06/27/17 274 lb 4 oz (124.4 kg)  06/10/17 276 lb (125.2 kg)  03/30/17 271 lb (122.9 kg)     Physical Exam  Diabetic Foot Exam - Simple   No data filed        Assessment & Plan:   Orvile was seen today for diabetes.  Diagnoses and all orders for this visit:  Diabetes mellitus without complication ([place]) -     Bayer DCA Hb A1c Waived  History of pulmonary embolus (PE)  Paroxysmal SVT (supraventricular tachycardia) (HCC)  HTN (hypertension), benign  Hypercholesteremia -     CBC with Differential/Platelet -     CMP14+EGFR -     Lipid panel  Benign prostatic hyperplasia, unspecified whether lower urinary tract symptoms present -     PSA, total and free  Other orders -     Tdap vaccine greater than or equal to 7yo IM   I have discontinued Brian Roach doxycycline. I am also having him maintain Brian Roach docusate sodium, Multiple Vitamins-Minerals (ONE-A-DAY MENS HEALTH FORMULA PO), apixaban, cetirizine, metFORMIN, diltiazem, atorvastatin, omeprazole, trazodone, benazepril, and cyclobenzaprine.  No orders of the defined types were placed in this encounter.    Follow-up: Return in about 3 months (around 09/26/2017).  Claretta Fraise, M.D.

## 2017-06-27 NOTE — Patient Instructions (Signed)

## 2017-06-28 LAB — CBC WITH DIFFERENTIAL/PLATELET
BASOS: 0 %
Basophils Absolute: 0 10*3/uL (ref 0.0–0.2)
EOS (ABSOLUTE): 0.3 10*3/uL (ref 0.0–0.4)
EOS: 3 %
HEMATOCRIT: 46.1 % (ref 37.5–51.0)
HEMOGLOBIN: 15.2 g/dL (ref 13.0–17.7)
IMMATURE GRANS (ABS): 0 10*3/uL (ref 0.0–0.1)
IMMATURE GRANULOCYTES: 0 %
LYMPHS: 26 %
Lymphocytes Absolute: 2.6 10*3/uL (ref 0.7–3.1)
MCH: 30.6 pg (ref 26.6–33.0)
MCHC: 33 g/dL (ref 31.5–35.7)
MCV: 93 fL (ref 79–97)
MONOCYTES: 10 %
Monocytes Absolute: 1 10*3/uL — ABNORMAL HIGH (ref 0.1–0.9)
NEUTROS PCT: 61 %
Neutrophils Absolute: 6.1 10*3/uL (ref 1.4–7.0)
PLATELETS: 286 10*3/uL (ref 150–379)
RBC: 4.96 x10E6/uL (ref 4.14–5.80)
RDW: 13.5 % (ref 12.3–15.4)
WBC: 10.1 10*3/uL (ref 3.4–10.8)

## 2017-06-28 LAB — LIPID PANEL
CHOL/HDL RATIO: 2.7 ratio (ref 0.0–5.0)
Cholesterol, Total: 131 mg/dL (ref 100–199)
HDL: 49 mg/dL (ref >39)
LDL Calculated: 68 mg/dL (ref 0–99)
Triglycerides: 72 mg/dL (ref 0–149)
VLDL CHOLESTEROL CAL: 14 mg/dL (ref 5–40)

## 2017-06-28 LAB — CMP14+EGFR
ALBUMIN: 4.9 g/dL (ref 3.5–5.5)
ALT: 29 IU/L (ref 0–44)
AST: 19 IU/L (ref 0–40)
Albumin/Globulin Ratio: 2 (ref 1.2–2.2)
Alkaline Phosphatase: 80 IU/L (ref 39–117)
BUN / CREAT RATIO: 17 (ref 9–20)
BUN: 17 mg/dL (ref 6–24)
Bilirubin Total: 0.3 mg/dL (ref 0.0–1.2)
CALCIUM: 9.9 mg/dL (ref 8.7–10.2)
CO2: 24 mmol/L (ref 20–29)
Chloride: 98 mmol/L (ref 96–106)
Creatinine, Ser: 0.99 mg/dL (ref 0.76–1.27)
GFR, EST AFRICAN AMERICAN: 97 mL/min/{1.73_m2} (ref >59)
GFR, EST NON AFRICAN AMERICAN: 84 mL/min/{1.73_m2} (ref >59)
GLOBULIN, TOTAL: 2.5 g/dL (ref 1.5–4.5)
Glucose: 124 mg/dL — ABNORMAL HIGH (ref 65–99)
POTASSIUM: 5.1 mmol/L (ref 3.5–5.2)
Sodium: 141 mmol/L (ref 134–144)
TOTAL PROTEIN: 7.4 g/dL (ref 6.0–8.5)

## 2017-06-28 LAB — PSA, TOTAL AND FREE
PSA FREE: 0.3 ng/mL
PSA, Free Pct: 30 %
Prostate Specific Ag, Serum: 1 ng/mL (ref 0.0–4.0)

## 2017-07-06 ENCOUNTER — Ambulatory Visit: Payer: BC Managed Care – PPO | Admitting: Family Medicine

## 2017-07-06 ENCOUNTER — Encounter: Payer: Self-pay | Admitting: Family Medicine

## 2017-07-06 VITALS — BP 116/80 | HR 74 | Temp 97.1°F | Ht 70.0 in | Wt 274.5 lb

## 2017-07-06 DIAGNOSIS — R399 Unspecified symptoms and signs involving the genitourinary system: Secondary | ICD-10-CM

## 2017-07-06 LAB — URINALYSIS
BILIRUBIN UA: NEGATIVE
GLUCOSE, UA: NEGATIVE
Ketones, UA: NEGATIVE
Nitrite, UA: NEGATIVE
PH UA: 7.5 (ref 5.0–7.5)
PROTEIN UA: NEGATIVE
RBC, UA: NEGATIVE
Specific Gravity, UA: 1.015 (ref 1.005–1.030)
Urobilinogen, Ur: 1 mg/dL (ref 0.2–1.0)

## 2017-07-06 MED ORDER — DOXYCYCLINE HYCLATE 100 MG PO TABS: 100.0000 mg | ORAL_TABLET | Freq: Two times a day (BID) | ORAL | 0 refills | Status: DC

## 2017-07-06 NOTE — Progress Notes (Signed)
Chief Complaint  Patient presents with  . Flank Pain    HPI  Patient presents today for burning with urination and frequency for several days. Denies fever . No flank pain. No nausea, vomiting.   PMH: Smoking status noted ROS: Per HPI  Objective: BP 116/80   Pulse 74   Temp (!) 97.1 F (36.2 C) (Oral)   Ht  (1.778 m)   Wt 274 lb 8 oz (124.5 kg)   BMI 39.39 kg/m  Gen: NAD, alert, cooperative with exam HEENT: NCAT, EOMI, PERRL CV: RRR, good S1/S2, no murmur Resp: CTABL, no wheezes, non-labored Abd: SNTND, BS present, no guarding or organomegaly Ext: No edema, warm Neuro: Alert and oriented, No gross deficits  Assessment and plan:  1. UTI symptoms     Meds ordered this encounter  Medications  . doxycycline (VIBRA-TABS) 100 MG tablet    Sig: Take 1 tablet (100 mg total) by mouth 2 (two) times daily.    Dispense:  14 tablet    Refill:  0    Orders Placed This Encounter  Procedures  . Urine Culture  . Urinalysis    Follow up as needed.  [name], MD

## 2017-07-07 LAB — URINE CULTURE: Organism ID, Bacteria: NO GROWTH

## 2017-07-09 ENCOUNTER — Encounter: Payer: Self-pay | Admitting: Family Medicine

## 2017-08-19 ENCOUNTER — Other Ambulatory Visit: Payer: Self-pay | Admitting: Family Medicine

## 2017-08-25 ENCOUNTER — Other Ambulatory Visit: Payer: Self-pay | Admitting: Family Medicine

## 2017-08-25 NOTE — Telephone Encounter (Signed)
Last seen 07/06/17  Dr Stacks 

## 2017-09-07 ENCOUNTER — Ambulatory Visit (INDEPENDENT_AMBULATORY_CARE_PROVIDER_SITE_OTHER): Payer: BC Managed Care – PPO

## 2017-09-07 ENCOUNTER — Encounter: Payer: Self-pay | Admitting: Family Medicine

## 2017-09-07 ENCOUNTER — Ambulatory Visit: Payer: BC Managed Care – PPO | Admitting: Family Medicine

## 2017-09-07 VITALS — BP 118/77 | HR 79 | Temp 97.8°F | Ht 70.0 in | Wt 278.5 lb

## 2017-09-07 DIAGNOSIS — J301 Allergic rhinitis due to pollen: Secondary | ICD-10-CM | POA: Diagnosis not present

## 2017-09-07 DIAGNOSIS — R071 Chest pain on breathing: Secondary | ICD-10-CM

## 2017-09-07 DIAGNOSIS — R0602 Shortness of breath: Secondary | ICD-10-CM

## 2017-09-07 DIAGNOSIS — K1379 Other lesions of oral mucosa: Secondary | ICD-10-CM | POA: Diagnosis not present

## 2017-09-07 MED ORDER — AMOXICILLIN-POT CLAVULANATE 875-125 MG PO TABS: 1.0000 | ORAL_TABLET | Freq: Two times a day (BID) | ORAL | 0 refills | Status: DC

## 2017-09-07 NOTE — Progress Notes (Signed)
Chief Complaint  Patient presents with  . Cough    pt here today c/o cough, congestion and "swollen tooth"    HPI  Patient presents today for Patient presents with upper respiratory congestion and post nasal drainage.There is moderate soreness with swelling in mouth at lateral aspect of right upper first molar. Patient reports coughing frequently as well.  white sputum noted. There is no fever, chills or sweats. The patient reports intermittent chest tightness and exertional shortness of breath. Onset was 3-5 days ago. Gradually worsening. Tried OTCs.Someimprovement with flonase and OTC allergy pill. Pt. Desires check for P.E.concerned also that it might be triggered by allergy to his dog. PMH: Smoking status noted ROS: Review of Systems  Constitutional: Negative for activity change, appetite change, chills and fever.  HENT: Positive for congestion, mouth sores, postnasal drip and sinus pressure. Negative for ear discharge, ear pain, hearing loss, nosebleeds, rhinorrhea, sneezing and trouble swallowing.   Respiratory: Positive for cough and shortness of breath. Negative for chest tightness.   Cardiovascular: Negative for chest pain and palpitations.  Skin: Negative for rash.   CXR: No acute changes. Chronic bronchitic changes noted as well as elevated right hemidiaphragm  Objective: BP 118/77   Pulse 79   Temp 97.8 F (36.6 C) (Oral)   Ht 5\' 10"  (1.778 m)   Wt 278 lb 8 oz (126.3 kg)   BMI 39.96 kg/m  Gen: NAD, alert, cooperative with exam HEENT: NCAT, Nasal passages swollen, red TMS clear The right upper first molar has swelling at the lateral gum with erythema CV: RRR, good S1/S2, no murmur Resp: Bronchitis changes non-labored Ext: No edema, warm Neuro: Alert and oriented, No gross deficits  Assessment and plan:  1. Shortness of breath   2. Seasonal allergic rhinitis due to pollen   3. Chest pain on breathing   4. Sore in mouth     Meds ordered this encounter  Medications   . amoxicillin-clavulanate (AUGMENTIN) 875-125 MG tablet    Sig: Take 1 tablet by mouth 2 (two) times daily.    Dispense:  20 tablet    Refill:  0    Orders Placed This Encounter  Procedures  . DG Chest 2 View    Standing Status:   Future    Number of Occurrences:   1    Standing Expiration Date:   11/07/2018    Order Specific Question:   Reason for Exam (SYMPTOM  OR DIAGNOSIS REQUIRED)    Answer:   dyspnea    Order Specific Question:   Preferred imaging location?    Answer:   Internal  . Allergen, Dog Dander, e5  . D-dimer, quantitative (not at [facility])    Follow up as needed.  Mechele ClaudeWarren Kamil Hanigan, MD

## 2017-09-09 LAB — D-DIMER, QUANTITATIVE (NOT AT ARMC): D-DIMER: 0.33 mg{FEU}/L (ref 0.00–0.49)

## 2017-09-09 LAB — ALLERGEN, DOG DANDER, E5

## 2017-09-26 ENCOUNTER — Other Ambulatory Visit: Payer: Self-pay | Admitting: Family Medicine

## 2017-09-27 NOTE — Telephone Encounter (Signed)
Last seen 09/07/17  Dr Stacks 

## 2017-10-03 ENCOUNTER — Encounter: Payer: Self-pay | Admitting: Family Medicine

## 2017-10-03 ENCOUNTER — Ambulatory Visit: Payer: BC Managed Care – PPO | Admitting: Family Medicine

## 2017-10-03 VITALS — BP 135/81 | HR 70 | Temp 97.9°F | Ht 70.0 in | Wt 279.5 lb

## 2017-10-03 DIAGNOSIS — E119 Type 2 diabetes mellitus without complications: Secondary | ICD-10-CM

## 2017-10-03 DIAGNOSIS — E7800 Pure hypercholesterolemia, unspecified: Secondary | ICD-10-CM

## 2017-10-03 DIAGNOSIS — I10 Essential (primary) hypertension: Secondary | ICD-10-CM

## 2017-10-03 LAB — BAYER DCA HB A1C WAIVED: HB A1C: 6.6 % (ref <7.0)

## 2017-10-03 MED ORDER — BENAZEPRIL HCL 10 MG PO TABS: 10.0000 mg | ORAL_TABLET | Freq: Every day | ORAL | 1 refills | Status: DC

## 2017-10-03 MED ORDER — ATORVASTATIN CALCIUM 20 MG PO TABS: 20.0000 mg | ORAL_TABLET | Freq: Every evening | ORAL | 1 refills | Status: DC

## 2017-10-03 MED ORDER — METFORMIN HCL ER 500 MG PO TB24: 500.0000 mg | ORAL_TABLET | Freq: Every day | ORAL | 3 refills | Status: DC

## 2017-10-03 MED ORDER — DILTIAZEM HCL ER COATED BEADS 360 MG PO CP24: 360.0000 mg | ORAL_CAPSULE | Freq: Every day | ORAL | 1 refills | Status: DC

## 2017-10-03 MED ORDER — CETIRIZINE HCL 10 MG PO TABS: 10.0000 mg | ORAL_TABLET | Freq: Every day | ORAL | 3 refills | Status: DC

## 2017-10-03 MED ORDER — TRAZODONE HCL 300 MG PO TABS: ORAL_TABLET | ORAL | 1 refills | Status: DC

## 2017-10-03 MED ORDER — OMEPRAZOLE 20 MG PO CPDR: 20.0000 mg | DELAYED_RELEASE_CAPSULE | Freq: Every day | ORAL | 1 refills | Status: DC

## 2017-10-03 NOTE — Progress Notes (Signed)
Subjective:  Patient ID: Brian Roach, male    DOB: [birth year]  Age: 57 y.o. MRN: [id number]  CC: Medical Management of Chronic Issues   HPI Brian Roach presents forFollow-up of diabetes. Patient checks blood sugar at home.   110 fasting and 130 postprandial Patient denies symptoms such as polyuria, polydipsia, excessive hunger, nausea No significant hypoglycemic spells noted. Medications reviewed. Pt reports taking them regularly without complication/adverse reaction being reported today.  Checking feet daily. Last eye appt was remote  History Brian Roach has a past medical history of Diabetes mellitus without complication ([place]), Hyperlipidemia, Hypertension, and PE (pulmonary embolism).   Brian Roach has a past surgical history that includes Back surgery; Knee surgery; Lung surgery; Anterior cruciate ligament repair (Right); Wisdom tooth extraction; and Quadriceps tendon repair (Left, 09/23/2016).   His family history includes Heart disease in his brother and father.Brian Roach reports that Brian Roach has never smoked. Brian Roach has never used smokeless tobacco. Brian Roach reports that Brian Roach does not drink alcohol or use drugs.  Current Outpatient Medications on File Prior to Visit  Medication Sig Dispense Refill  . apixaban (ELIQUIS) 5 MG TABS tablet Take 1 tablet (5 mg total) by mouth 2 (two) times daily. 60 tablet 5  . cyclobenzaprine (FLEXERIL) 10 MG tablet TAKE 1 TABLET BY MOUTH THREE TIMES A DAY AS NEEDED FOR MUSCLE SPASMS 90 tablet 0  . docusate sodium (COLACE) 100 MG capsule Take 200 mg by mouth 2 (two) times daily.    . Multiple Vitamins-Minerals (ONE-A-DAY MENS HEALTH FORMULA PO) Take 1 tablet by mouth daily.     No current facility-administered medications on file prior to visit.     ROS Review of Systems  Constitutional: Negative.   HENT: Negative.   Eyes: Negative for visual disturbance.  Respiratory: Negative for cough and shortness of breath.   Cardiovascular: Negative for chest pain and leg  swelling.  Gastrointestinal: Negative for abdominal pain, diarrhea, nausea and vomiting.  Genitourinary: Negative for difficulty urinating.  Musculoskeletal: Negative for arthralgias and myalgias.  Skin: Negative for rash.  Neurological: Negative for headaches.  Psychiatric/Behavioral: Negative for sleep disturbance.    Objective:  BP 135/81   Pulse 70   Temp 97.9 F (36.6 C) (Oral)   Ht _0  (1.778 m)   Wt 279 lb 8 oz (126.8 kg)   BMI 40.10 kg/m   BP Readings from Last 3 Encounters:  10/03/17 135/81  09/07/17 118/77  07/06/17 116/80    Wt Readings from Last 3 Encounters:  10/03/17 279 lb 8 oz (126.8 kg)  09/07/17 278 lb 8 oz (126.3 kg)  07/06/17 274 lb 8 oz (124.5 kg)     Physical Exam  Constitutional: Brian Roach is oriented to person, place, and time. Brian Roach appears well-developed and well-nourished. No distress.  HENT:  Head: Normocephalic and atraumatic.  Right Ear: External ear normal.  Left Ear: External ear normal.  Nose: Nose normal.  Mouth/Throat: Oropharynx is clear and moist.  Eyes: Pupils are equal, round, and reactive to light. Conjunctivae and EOM are normal.  Neck: Normal range of motion. Neck supple.  Cardiovascular: Normal rate, regular rhythm and normal heart sounds.  No murmur heard. Pulmonary/Chest: Effort normal and breath sounds normal. No respiratory distress. Brian Roach has no wheezes. Brian Roach has no rales.  Abdominal: Soft. There is no tenderness.  Musculoskeletal: Normal range of motion.  Neurological: Brian Roach is alert and oriented to person, place, and time. Brian Roach has normal reflexes.  Skin: Skin is warm and dry.  Psychiatric: Brian Roach has  a normal mood and affect. His behavior is normal. Judgment and thought content normal.      Assessment & Plan:   Brian Roach was seen today for medical management of chronic issues.  Diagnoses and all orders for this visit:  Diabetes mellitus without complication ([place]) -     CMP14+EGFR -     Microalbumin / creatinine urine ratio -      Bayer DCA Hb A1c Waived  HTN (hypertension), benign  Hypercholesteremia  Other orders -     atorvastatin (LIPITOR) 20 MG tablet; Take 1 tablet (20 mg total) by mouth every evening. -     benazepril (LOTENSIN) 10 MG tablet; Take 1 tablet (10 mg total) by mouth daily. -     cetirizine (ZYRTEC) 10 MG tablet; Take 1 tablet (10 mg total) by mouth daily. For allergy symptoms -     diltiazem (CARDIZEM CD) 360 MG 24 hr capsule; Take 1 capsule (360 mg total) by mouth daily. -     metFORMIN (GLUCOPHAGE-XR) 500 MG 24 hr tablet; Take 1 tablet (500 mg total) by mouth daily with breakfast. -     omeprazole (PRILOSEC) 20 MG capsule; Take 1 capsule (20 mg total) by mouth daily. -     trazodone (DESYREL) 300 MG tablet; TAKE 1 TABLET BY MOUTH EVERYDAY AT BEDTIME      I have discontinued Brian Roach's amoxicillin-clavulanate. I have also changed his benazepril. Additionally, I am having him maintain his docusate sodium, Multiple Vitamins-Minerals (ONE-A-DAY MENS HEALTH FORMULA PO), apixaban, cyclobenzaprine, atorvastatin, cetirizine, diltiazem, metFORMIN, omeprazole, and trazodone.  Meds ordered this encounter  Medications  . atorvastatin (LIPITOR) 20 MG tablet    Sig: Take 1 tablet (20 mg total) by mouth every evening.    Dispense:  90 tablet    Refill:  1  . benazepril (LOTENSIN) 10 MG tablet    Sig: Take 1 tablet (10 mg total) by mouth daily.    Dispense:  90 tablet    Refill:  1  . cetirizine (ZYRTEC) 10 MG tablet    Sig: Take 1 tablet (10 mg total) by mouth daily. For allergy symptoms    Dispense:  90 tablet    Refill:  3  . diltiazem (CARDIZEM CD) 360 MG 24 hr capsule    Sig: Take 1 capsule (360 mg total) by mouth daily.    Dispense:  90 capsule    Refill:  1  . metFORMIN (GLUCOPHAGE-XR) 500 MG 24 hr tablet    Sig: Take 1 tablet (500 mg total) by mouth daily with breakfast.    Dispense:  90 tablet    Refill:  3  . omeprazole (PRILOSEC) 20 MG capsule    Sig: Take 1 capsule (20  mg total) by mouth daily.    Dispense:  90 capsule    Refill:  1  . trazodone (DESYREL) 300 MG tablet    Sig: TAKE 1 TABLET BY MOUTH EVERYDAY AT BEDTIME    Dispense:  90 tablet    Refill:  1     Follow-up: Return in about 3 months (around 01/03/2018).  Claretta Fraise, M.D.

## 2017-10-03 NOTE — Patient Instructions (Signed)

## 2017-10-04 LAB — CMP14+EGFR
ALBUMIN: 4.5 g/dL (ref 3.5–5.5)
ALK PHOS: 78 IU/L (ref 39–117)
ALT: 32 IU/L (ref 0–44)
AST: 21 IU/L (ref 0–40)
Albumin/Globulin Ratio: 1.7 (ref 1.2–2.2)
BUN / CREAT RATIO: 18 (ref 9–20)
BUN: 17 mg/dL (ref 6–24)
Bilirubin Total: 0.3 mg/dL (ref 0.0–1.2)
CALCIUM: 9.5 mg/dL (ref 8.7–10.2)
CHLORIDE: 103 mmol/L (ref 96–106)
CO2: 23 mmol/L (ref 20–29)
Creatinine, Ser: 0.95 mg/dL (ref 0.76–1.27)
GFR calc Af Amer: 101 mL/min/{1.73_m2} (ref >59)
GFR calc non Af Amer: 87 mL/min/{1.73_m2} (ref >59)
GLOBULIN, TOTAL: 2.6 g/dL (ref 1.5–4.5)
GLUCOSE: 125 mg/dL — AB (ref 65–99)
POTASSIUM: 4.9 mmol/L (ref 3.5–5.2)
Sodium: 140 mmol/L (ref 134–144)
Total Protein: 7.1 g/dL (ref 6.0–8.5)

## 2017-10-04 LAB — MICROALBUMIN / CREATININE URINE RATIO
CREATININE, UR: 118.5 mg/dL
MICROALB/CREAT RATIO: 50.4 mg/g{creat} — AB (ref 0.0–30.0)
Microalbumin, Urine: 59.7 ug/mL

## 2017-10-05 ENCOUNTER — Other Ambulatory Visit: Payer: Self-pay | Admitting: Family Medicine

## 2017-10-05 MED ORDER — BENAZEPRIL HCL 20 MG PO TABS: 20.0000 mg | ORAL_TABLET | Freq: Every day | ORAL | 5 refills | Status: DC

## 2017-10-22 ENCOUNTER — Encounter: Payer: Self-pay | Admitting: Nurse Practitioner

## 2017-10-22 ENCOUNTER — Ambulatory Visit: Payer: BC Managed Care – PPO | Admitting: Nurse Practitioner

## 2017-10-22 VITALS — BP 130/85 | HR 72 | Temp 97.1°F | Ht 70.0 in | Wt 278.0 lb

## 2017-10-22 DIAGNOSIS — R6883 Chills (without fever): Secondary | ICD-10-CM | POA: Diagnosis not present

## 2017-10-22 NOTE — Progress Notes (Signed)
   Subjective:    Patient ID: Brian Roach, male    DOB: 03/21/1960, 57 y.o.   MRN: [id number]   Chief Complaint: chills  HPI patint come sin c/o of alternating from hot to cold with chills. Started a couple of days ago. He has been on 3 rounds of amoxicillin for abscess tooth, but that is not bothering him. Says that the only other time he had chills like this was when he was in the hospital with PE. He had to stop his eliquis for a week to have dental work done.   Review of Systems  Constitutional: Positive for chills and diaphoresis. Negative for fever.  HENT: Negative.   Respiratory: Positive for cough (slight cough but is nonproductive). Negative for shortness of breath.   Cardiovascular: Negative for chest pain, palpitations and leg swelling.  Gastrointestinal: Negative.   Genitourinary: Negative.   Neurological: Negative.   Psychiatric/Behavioral: Negative.   All other systems reviewed and are negative.      Objective:   Physical Exam  Constitutional: He is oriented to person, place, and time. He appears well-developed and well-nourished.  Eyes: Pupils are equal, round, and reactive to light. EOM are normal.  Neck: Normal range of motion. Neck supple.  Cardiovascular: Normal rate.  Pulmonary/Chest: Effort normal and breath sounds normal.  Neurological: He is alert and oriented to person, place, and time.  Skin: Skin is warm.  Psychiatric: He has a normal mood and affect. His behavior is normal. Judgment and thought content normal.  Nursing note and vitals reviewed.  BP 130/85   Pulse 72   Temp (!) 97.1 F (36.2 C) (Oral)   Ht 5\' 10"  (1.778 m)   Wt 278 lb (126.1 kg)   BMI 39.89 kg/m          Assessment & Plan:  Brian Roach in today with chief complaint of Chills   1. Chills (without fever) Force fluids Rest RTO prn Will call with CBC results  Mary-Margaret Daphine DeutscherMartin, FNP  - CBC with Differential/Platelet

## 2017-10-23 LAB — CBC WITH DIFFERENTIAL/PLATELET
BASOS ABS: 0.1 10*3/uL (ref 0.0–0.2)
Basos: 1 %
EOS (ABSOLUTE): 0.2 10*3/uL (ref 0.0–0.4)
Eos: 2 %
Hematocrit: 48.8 % (ref 37.5–51.0)
Hemoglobin: 16 g/dL (ref 13.0–17.7)
Immature Grans (Abs): 0.1 10*3/uL (ref 0.0–0.1)
Immature Granulocytes: 1 %
LYMPHS ABS: 2 10*3/uL (ref 0.7–3.1)
Lymphs: 22 %
MCH: 29.7 pg (ref 26.6–33.0)
MCHC: 32.8 g/dL (ref 31.5–35.7)
MCV: 91 fL (ref 79–97)
Monocytes Absolute: 0.8 10*3/uL (ref 0.1–0.9)
Monocytes: 8 %
NEUTROS ABS: 6 10*3/uL (ref 1.4–7.0)
Neutrophils: 66 %
PLATELETS: 282 10*3/uL (ref 150–450)
RBC: 5.39 x10E6/uL (ref 4.14–5.80)
RDW: 12.9 % (ref 12.3–15.4)
WBC: 9.2 10*3/uL (ref 3.4–10.8)

## 2017-12-28 ENCOUNTER — Encounter: Payer: Self-pay | Admitting: Family Medicine

## 2017-12-28 ENCOUNTER — Ambulatory Visit: Payer: BC Managed Care – PPO | Admitting: Family Medicine

## 2017-12-28 VITALS — BP 122/79 | HR 98 | Temp 97.8°F | Ht 70.0 in | Wt 279.0 lb

## 2017-12-28 DIAGNOSIS — Z23 Encounter for immunization: Secondary | ICD-10-CM | POA: Diagnosis not present

## 2017-12-28 DIAGNOSIS — E119 Type 2 diabetes mellitus without complications: Secondary | ICD-10-CM

## 2017-12-28 DIAGNOSIS — I10 Essential (primary) hypertension: Secondary | ICD-10-CM

## 2017-12-28 LAB — BAYER DCA HB A1C WAIVED: HB A1C (BAYER DCA - WAIVED): 6.6 % (ref <7.0)

## 2017-12-28 MED ORDER — DILTIAZEM HCL ER COATED BEADS 360 MG PO CP24: 360.0000 mg | ORAL_CAPSULE | Freq: Every day | ORAL | 1 refills | Status: DC

## 2017-12-28 MED ORDER — BENAZEPRIL HCL 20 MG PO TABS: 20.0000 mg | ORAL_TABLET | Freq: Every day | ORAL | 1 refills | Status: DC

## 2017-12-28 MED ORDER — METFORMIN HCL ER 500 MG PO TB24: 500.0000 mg | ORAL_TABLET | Freq: Every day | ORAL | 1 refills | Status: DC

## 2017-12-28 MED ORDER — TRAZODONE HCL 300 MG PO TABS: ORAL_TABLET | ORAL | 1 refills | Status: DC

## 2017-12-28 MED ORDER — OMEPRAZOLE 20 MG PO CPDR: 20.0000 mg | DELAYED_RELEASE_CAPSULE | Freq: Every day | ORAL | 1 refills | Status: DC

## 2017-12-28 MED ORDER — APIXABAN 5 MG PO TABS: 5.0000 mg | ORAL_TABLET | Freq: Two times a day (BID) | ORAL | 5 refills | Status: DC

## 2017-12-28 MED ORDER — ATORVASTATIN CALCIUM 20 MG PO TABS: 20.0000 mg | ORAL_TABLET | Freq: Every evening | ORAL | 1 refills | Status: DC

## 2017-12-28 NOTE — Progress Notes (Signed)
Subjective:  Patient ID: Brian Roach, male    DOB: October 14, 1960  Age: 57 y.o. MRN: [id number]  CC: Medical Management of Chronic Issues   HPI Brian Roach presents forFollow-up of diabetes. Patient checks blood sugar at home.  100-1 25 fasting 1 outlier to 163.  A few in the 140s but most remain around 120.  Fasting and very similar postprandial Patient denies symptoms such as polyuria, polydipsia, excessive hunger, nausea No significant hypoglycemic spells noted. Medications reviewed. Pt reports taking them regularly without complication/adverse reaction being reported today.  Checking feet daily.   History Brian Roach has a past medical history of Diabetes mellitus without complication ([place]), Hyperlipidemia, Hypertension, and PE (pulmonary embolism).   Brian Roach has a past surgical history that includes Back surgery; Knee surgery; Lung surgery; Anterior cruciate ligament repair (Right); Wisdom tooth extraction; and Quadriceps tendon repair (Left, 09/23/2016).   His family history includes Heart disease in his brother and father.Brian Roach reports that Brian Roach has never smoked. Brian Roach has never used smokeless tobacco. Brian Roach reports that Brian Roach does not drink alcohol or use drugs.  Current Outpatient Medications on File Prior to Visit  Medication Sig Dispense Refill  . cetirizine (ZYRTEC) 10 MG tablet Take 1 tablet (10 mg total) by mouth daily. For allergy symptoms 90 tablet 3  . cyclobenzaprine (FLEXERIL) 10 MG tablet TAKE 1 TABLET BY MOUTH THREE TIMES A DAY AS NEEDED FOR MUSCLE SPASMS 90 tablet 0  . docusate sodium (COLACE) 100 MG capsule Take 200 mg by mouth 2 (two) times daily.    . Multiple Vitamins-Minerals (ONE-A-DAY MENS HEALTH FORMULA PO) Take 1 tablet by mouth daily.     No current facility-administered medications on file prior to visit.     ROS Review of Systems  Constitutional: Negative.   HENT: Negative.   Eyes: Negative for visual disturbance.  Respiratory: Negative for cough and shortness of  breath.   Cardiovascular: Negative for chest pain and leg swelling.  Gastrointestinal: Negative for abdominal pain, diarrhea, nausea and vomiting.  Genitourinary: Negative for difficulty urinating.  Musculoskeletal: Negative for arthralgias and myalgias.  Skin: Negative for rash.  Neurological: Negative for headaches.  Psychiatric/Behavioral: Negative for sleep disturbance.    Objective:  BP 122/79   Pulse 98   Temp 97.8 F (36.6 C) (Oral)   Ht 5' 10" (1.778 m)   Wt 279 lb (126.6 kg)   BMI 40.03 kg/m   BP Readings from Last 3 Encounters:  12/28/17 122/79  10/22/17 130/85  10/03/17 135/81    Wt Readings from Last 3 Encounters:  12/28/17 279 lb (126.6 kg)  10/22/17 278 lb (126.1 kg)  10/03/17 279 lb 8 oz (126.8 kg)     Physical Exam  Constitutional: Brian Roach is oriented to person, place, and time. Brian Roach appears well-developed and well-nourished. No distress.  HENT:  Head: Normocephalic and atraumatic.  Right Ear: External ear normal.  Left Ear: External ear normal.  Nose: Nose normal.  Mouth/Throat: Oropharynx is clear and moist.  Eyes: Pupils are equal, round, and reactive to light. Conjunctivae and EOM are normal.  Neck: Normal range of motion. Neck supple.  Cardiovascular: Normal rate, regular rhythm and normal heart sounds.  No murmur heard. Pulmonary/Chest: Effort normal and breath sounds normal. No respiratory distress. Brian Roach has no wheezes. Brian Roach has no rales.  Abdominal: Soft. There is no tenderness.  Musculoskeletal: Normal range of motion.  Neurological: Brian Roach is alert and oriented to person, place, and time. Brian Roach has normal reflexes.  Skin: Skin is  warm and dry.  Psychiatric: Brian Roach has a normal mood and affect. His behavior is normal. Judgment and thought content normal.      Assessment & Plan:   [name] was seen today for medical management of chronic issues.  Diagnoses and all orders for this visit:  Diabetes mellitus without complication ([place]) -     Bayer DCA Hb A1c  Waived  HTN (hypertension), benign -     CBC with Differential/Platelet -     CMP14+EGFR  Need for immunization against influenza -     Flu Vaccine QUAD 36+ mos IM  Other orders -     trazodone (DESYREL) 300 MG tablet; TAKE 1 TABLET BY MOUTH EVERYDAY AT BEDTIME -     omeprazole (PRILOSEC) 20 MG capsule; Take 1 capsule (20 mg total) by mouth daily. -     benazepril (LOTENSIN) 20 MG tablet; Take 1 tablet (20 mg total) by mouth daily. -     atorvastatin (LIPITOR) 20 MG tablet; Take 1 tablet (20 mg total) by mouth every evening. -     diltiazem (CARDIZEM CD) 360 MG 24 hr capsule; Take 1 capsule (360 mg total) by mouth daily. -     apixaban (ELIQUIS) 5 MG TABS tablet; Take 1 tablet (5 mg total) by mouth 2 (two) times daily. -     metFORMIN (GLUCOPHAGE-XR) 500 MG 24 hr tablet; Take 1 tablet (500 mg total) by mouth daily with breakfast.      I am having Brian Roach maintain his docusate sodium, Multiple Vitamins-Minerals (ONE-A-DAY MENS HEALTH FORMULA PO), cyclobenzaprine, cetirizine, trazodone, omeprazole, benazepril, atorvastatin, diltiazem, apixaban, and metFORMIN.  Meds ordered this encounter  Medications  . trazodone (DESYREL) 300 MG tablet    Sig: TAKE 1 TABLET BY MOUTH EVERYDAY AT BEDTIME    Dispense:  90 tablet    Refill:  1  . omeprazole (PRILOSEC) 20 MG capsule    Sig: Take 1 capsule (20 mg total) by mouth daily.    Dispense:  90 capsule    Refill:  1  . benazepril (LOTENSIN) 20 MG tablet    Sig: Take 1 tablet (20 mg total) by mouth daily.    Dispense:  90 tablet    Refill:  1  . atorvastatin (LIPITOR) 20 MG tablet    Sig: Take 1 tablet (20 mg total) by mouth every evening.    Dispense:  90 tablet    Refill:  1  . diltiazem (CARDIZEM CD) 360 MG 24 hr capsule    Sig: Take 1 capsule (360 mg total) by mouth daily.    Dispense:  90 capsule    Refill:  1  . apixaban (ELIQUIS) 5 MG TABS tablet    Sig: Take 1 tablet (5 mg total) by mouth 2 (two) times daily.     Dispense:  60 tablet    Refill:  5  . metFORMIN (GLUCOPHAGE-XR) 500 MG 24 hr tablet    Sig: Take 1 tablet (500 mg total) by mouth daily with breakfast.    Dispense:  90 tablet    Refill:  1     Follow-up: Return in about 3 months (around 03/30/2018).  Claretta Fraise, M.D.

## 2017-12-29 LAB — CBC WITH DIFFERENTIAL/PLATELET
BASOS ABS: 0.1 10*3/uL (ref 0.0–0.2)
Basos: 1 %
EOS (ABSOLUTE): 0.3 10*3/uL (ref 0.0–0.4)
Eos: 3 %
Hematocrit: 45.9 % (ref 37.5–51.0)
Hemoglobin: 15.3 g/dL (ref 13.0–17.7)
Immature Grans (Abs): 0.1 10*3/uL (ref 0.0–0.1)
Immature Granulocytes: 1 %
LYMPHS ABS: 2.9 10*3/uL (ref 0.7–3.1)
LYMPHS: 31 %
MCH: 29.9 pg (ref 26.6–33.0)
MCHC: 33.3 g/dL (ref 31.5–35.7)
MCV: 90 fL (ref 79–97)
Monocytes Absolute: 0.9 10*3/uL (ref 0.1–0.9)
Monocytes: 10 %
NEUTROS ABS: 5 10*3/uL (ref 1.4–7.0)
Neutrophils: 54 %
Platelets: 262 10*3/uL (ref 150–450)
RBC: 5.11 x10E6/uL (ref 4.14–5.80)
RDW: 13 % (ref 12.3–15.4)
WBC: 9.2 10*3/uL (ref 3.4–10.8)

## 2017-12-29 LAB — CMP14+EGFR
ALK PHOS: 85 IU/L (ref 39–117)
ALT: 37 IU/L (ref 0–44)
AST: 20 IU/L (ref 0–40)
Albumin/Globulin Ratio: 1.9 (ref 1.2–2.2)
Albumin: 4.7 g/dL (ref 3.5–5.5)
BILIRUBIN TOTAL: 0.3 mg/dL (ref 0.0–1.2)
BUN / CREAT RATIO: 13 (ref 9–20)
BUN: 12 mg/dL (ref 6–24)
CO2: 25 mmol/L (ref 20–29)
Calcium: 9.7 mg/dL (ref 8.7–10.2)
Chloride: 102 mmol/L (ref 96–106)
Creatinine, Ser: 0.91 mg/dL (ref 0.76–1.27)
GFR calc Af Amer: 106 mL/min/{1.73_m2} (ref >59)
GFR calc non Af Amer: 92 mL/min/{1.73_m2} (ref >59)
GLUCOSE: 123 mg/dL — AB (ref 65–99)
Globulin, Total: 2.5 g/dL (ref 1.5–4.5)
POTASSIUM: 5.1 mmol/L (ref 3.5–5.2)
Sodium: 143 mmol/L (ref 134–144)
Total Protein: 7.2 g/dL (ref 6.0–8.5)

## 2017-12-29 NOTE — Progress Notes (Signed)
Hello Marquiz,  Your lab result is normal.Some minor variations that are not significant are commonly marked abnormal, but do not represent any medical problem for you.  Best regards, Ezabella Teska, M.D.

## 2018-01-03 ENCOUNTER — Ambulatory Visit: Payer: BC Managed Care – PPO | Admitting: Family Medicine

## 2018-04-03 ENCOUNTER — Ambulatory Visit: Payer: BC Managed Care – PPO | Admitting: Family Medicine

## 2018-04-05 ENCOUNTER — Ambulatory Visit: Payer: BC Managed Care – PPO | Admitting: Family Medicine

## 2018-04-19 ENCOUNTER — Encounter: Payer: Self-pay | Admitting: Family Medicine

## 2018-04-19 ENCOUNTER — Ambulatory Visit: Payer: BC Managed Care – PPO | Admitting: Family Medicine

## 2018-04-19 VITALS — BP 120/85 | HR 87 | Temp 97.1°F | Ht 70.0 in | Wt 278.0 lb

## 2018-04-19 DIAGNOSIS — M150 Primary generalized (osteo)arthritis: Secondary | ICD-10-CM

## 2018-04-19 DIAGNOSIS — Z6841 Body Mass Index (BMI) 40.0 and over, adult: Secondary | ICD-10-CM

## 2018-04-19 DIAGNOSIS — E7800 Pure hypercholesterolemia, unspecified: Secondary | ICD-10-CM

## 2018-04-19 DIAGNOSIS — Z23 Encounter for immunization: Secondary | ICD-10-CM

## 2018-04-19 DIAGNOSIS — E66813 Obesity, class 3: Secondary | ICD-10-CM

## 2018-04-19 DIAGNOSIS — E119 Type 2 diabetes mellitus without complications: Secondary | ICD-10-CM

## 2018-04-19 DIAGNOSIS — Z86711 Personal history of pulmonary embolism: Secondary | ICD-10-CM

## 2018-04-19 DIAGNOSIS — M159 Polyosteoarthritis, unspecified: Secondary | ICD-10-CM

## 2018-04-19 DIAGNOSIS — I10 Essential (primary) hypertension: Secondary | ICD-10-CM | POA: Diagnosis not present

## 2018-04-19 DIAGNOSIS — N400 Enlarged prostate without lower urinary tract symptoms: Secondary | ICD-10-CM

## 2018-04-19 LAB — CMP14+EGFR
ALK PHOS: 84 IU/L (ref 39–117)
ALT: 41 IU/L (ref 0–44)
AST: 28 IU/L (ref 0–40)
Albumin/Globulin Ratio: 1.9 (ref 1.2–2.2)
Albumin: 4.4 g/dL (ref 3.8–4.9)
BILIRUBIN TOTAL: 0.5 mg/dL (ref 0.0–1.2)
BUN/Creatinine Ratio: 13 (ref 9–20)
BUN: 13 mg/dL (ref 6–24)
CHLORIDE: 99 mmol/L (ref 96–106)
CO2: 25 mmol/L (ref 20–29)
Calcium: 9.5 mg/dL (ref 8.7–10.2)
Creatinine, Ser: 1 mg/dL (ref 0.76–1.27)
GFR calc Af Amer: 95 mL/min/{1.73_m2} (ref >59)
GFR calc non Af Amer: 82 mL/min/{1.73_m2} (ref >59)
GLUCOSE: 153 mg/dL — AB (ref 65–99)
Globulin, Total: 2.3 g/dL (ref 1.5–4.5)
Potassium: 4.7 mmol/L (ref 3.5–5.2)
Sodium: 139 mmol/L (ref 134–144)
Total Protein: 6.7 g/dL (ref 6.0–8.5)

## 2018-04-19 LAB — CBC WITH DIFFERENTIAL/PLATELET
BASOS: 1 %
Basophils Absolute: 0.1 10*3/uL (ref 0.0–0.2)
EOS (ABSOLUTE): 0.5 10*3/uL — ABNORMAL HIGH (ref 0.0–0.4)
Eos: 5 %
HEMOGLOBIN: 15 g/dL (ref 13.0–17.7)
Hematocrit: 44.2 % (ref 37.5–51.0)
IMMATURE GRANS (ABS): 0.1 10*3/uL (ref 0.0–0.1)
IMMATURE GRANULOCYTES: 1 %
LYMPHS: 22 %
Lymphocytes Absolute: 2.1 10*3/uL (ref 0.7–3.1)
MCH: 30.7 pg (ref 26.6–33.0)
MCHC: 33.9 g/dL (ref 31.5–35.7)
MCV: 90 fL (ref 79–97)
MONOCYTES: 8 %
Monocytes Absolute: 0.8 10*3/uL (ref 0.1–0.9)
NEUTROS ABS: 6.1 10*3/uL (ref 1.4–7.0)
NEUTROS PCT: 63 %
PLATELETS: 241 10*3/uL (ref 150–450)
RBC: 4.89 x10E6/uL (ref 4.14–5.80)
RDW: 12.8 % (ref 11.6–15.4)
WBC: 9.7 10*3/uL (ref 3.4–10.8)

## 2018-04-19 LAB — LIPID PANEL
Chol/HDL Ratio: 3 ratio (ref 0.0–5.0)
Cholesterol, Total: 119 mg/dL (ref 100–199)
HDL: 40 mg/dL (ref >39)
LDL Calculated: 63 mg/dL (ref 0–99)
Triglycerides: 80 mg/dL (ref 0–149)
VLDL Cholesterol Cal: 16 mg/dL (ref 5–40)

## 2018-04-19 LAB — URINALYSIS
Bilirubin, UA: NEGATIVE
Glucose, UA: NEGATIVE
KETONES UA: NEGATIVE
Leukocytes, UA: NEGATIVE
Nitrite, UA: NEGATIVE
SPEC GRAV UA: 1.025 (ref 1.005–1.030)
Urobilinogen, Ur: 0.2 mg/dL (ref 0.2–1.0)
pH, UA: 6 (ref 5.0–7.5)

## 2018-04-19 LAB — BAYER DCA HB A1C WAIVED: HB A1C (BAYER DCA - WAIVED): 7 % — ABNORMAL HIGH (ref <7.0)

## 2018-04-19 MED ORDER — APIXABAN 5 MG PO TABS: 5.0000 mg | ORAL_TABLET | Freq: Two times a day (BID) | ORAL | 5 refills | Status: DC

## 2018-04-19 MED ORDER — TRAZODONE HCL 300 MG PO TABS: ORAL_TABLET | ORAL | 1 refills | Status: DC

## 2018-04-19 MED ORDER — METFORMIN HCL ER 500 MG PO TB24: 500.0000 mg | ORAL_TABLET | Freq: Every day | ORAL | 1 refills | Status: DC

## 2018-04-19 MED ORDER — DILTIAZEM HCL ER COATED BEADS 360 MG PO CP24: 360.0000 mg | ORAL_CAPSULE | Freq: Every day | ORAL | 1 refills | Status: DC

## 2018-04-19 MED ORDER — OMEPRAZOLE 20 MG PO CPDR: 20.0000 mg | DELAYED_RELEASE_CAPSULE | Freq: Every day | ORAL | 1 refills | Status: DC

## 2018-04-19 MED ORDER — BENAZEPRIL HCL 20 MG PO TABS: 20.0000 mg | ORAL_TABLET | Freq: Every day | ORAL | 1 refills | Status: DC

## 2018-04-19 MED ORDER — ATORVASTATIN CALCIUM 20 MG PO TABS: 20.0000 mg | ORAL_TABLET | Freq: Every evening | ORAL | 1 refills | Status: DC

## 2018-04-19 NOTE — Progress Notes (Signed)
 Subjective:  Patient ID: Brian Roach,  male    DOB: 02/27/1961  Age: 57 y.o.    CC: Medical Management of Chronic Issues (3 month )   HPI Isiah F Weyand presents for  follow-up of hypertension. Patient has no history of headache chest pain or shortness of breath or recent cough. Patient also denies symptoms of TIA such as numbness weakness lateralizing. Patient denies side effects from medication. States taking it regularly.  Patient also  in for follow-up of elevated cholesterol. Doing well without complaints on current medication. Denies side effects  including myalgia and arthralgia and nausea. Also in today for liver function testing. Currently no chest pain, shortness of breath or other cardiovascular related symptoms noted.  Follow-up of diabetes. Patient does check blood sugar at home. Readings run between 110 and 130. Filled out logs, forgot them. Will bring them by later. Patient denies symptoms such as excessive hunger or urinary frequency, excessive hunger, nausea No significant hypoglycemic spells noted. Medications reviewed. Pt reports taking them regularly. Pt. denies complication/adverse reaction today.    History Javien has a past medical history of Diabetes mellitus without complication (HCC), Hyperlipidemia, Hypertension, and PE (pulmonary embolism).   He has a past surgical history that includes Back surgery; Knee surgery; Lung surgery; Anterior cruciate ligament repair (Right); Wisdom tooth extraction; and Quadriceps tendon repair (Left, 09/23/2016).   His family history includes Heart disease in his brother and father.He reports that he has never smoked. He has never used smokeless tobacco. He reports that he does not drink alcohol or use drugs.  Current Outpatient Medications on File Prior to Visit  Medication Sig Dispense Refill  . cetirizine (ZYRTEC) 10 MG tablet Take 1 tablet (10 mg total) by mouth daily. For allergy symptoms 90 tablet 3  . docusate  sodium (COLACE) 100 MG capsule Take 200 mg by mouth 2 (two) times daily.    . cyclobenzaprine (FLEXERIL) 10 MG tablet TAKE 1 TABLET BY MOUTH THREE TIMES A DAY AS NEEDED FOR MUSCLE SPASMS (Patient not taking: Reported on 04/19/2018) 90 tablet 0  . Multiple Vitamins-Minerals (ONE-A-DAY MENS HEALTH FORMULA PO) Take 1 tablet by mouth daily.     No current facility-administered medications on file prior to visit.     ROS Review of Systems  Constitutional: Negative.   HENT: Negative.   Eyes: Negative for visual disturbance.  Respiratory: Negative for cough and shortness of breath.   Cardiovascular: Negative for chest pain and leg swelling.  Gastrointestinal: Negative for abdominal pain, diarrhea, nausea and vomiting.  Genitourinary: Negative for difficulty urinating.  Musculoskeletal: Positive for arthralgias (knees, back, everywhere - not eligible for tx due to eliquis use). Negative for myalgias.  Skin: Negative for rash.  Neurological: Negative for headaches.  Psychiatric/Behavioral: Negative for sleep disturbance.    Objective:  BP 120/85   Pulse 87   Temp (!) 97.1 F (36.2 C) (Oral)   Ht 5' 10" (1.778 m)   Wt 278 lb (126.1 kg)   BMI 39.89 kg/m   BP Readings from Last 3 Encounters:  04/19/18 120/85  12/28/17 122/79  10/22/17 130/85    Wt Readings from Last 3 Encounters:  04/19/18 278 lb (126.1 kg)  12/28/17 279 lb (126.6 kg)  10/22/17 278 lb (126.1 kg)     Physical Exam Vitals signs reviewed.  Constitutional:      Appearance: He is well-developed.  HENT:     Head: Normocephalic and atraumatic.     Right Ear: Tympanic membrane and   Subjective:  Patient ID: GRIFFYN Roach,  male    DOB: 01-25-1961  Age: 58 y.o.    CC: Medical Management of Chronic Issues (3 month )   HPI TINA TEMME presents for  follow-up of hypertension. Patient has no history of headache chest pain or shortness of breath or recent cough. Patient also denies symptoms of TIA such as numbness weakness lateralizing. Patient denies side effects from medication. States taking it regularly.  Patient also  in for follow-up of elevated cholesterol. Doing well without complaints on current medication. Denies side effects  including myalgia and arthralgia and nausea. Also in today for liver function testing. Currently no chest pain, shortness of breath or other cardiovascular related symptoms noted.  Follow-up of diabetes. Patient does check blood sugar at home. Readings run between 110 and 130. Filled out logs, forgot them. Will bring them by later. Patient denies symptoms such as excessive hunger or urinary frequency, excessive hunger, nausea No significant hypoglycemic spells noted. Medications reviewed. Pt reports taking them regularly. Pt. denies complication/adverse reaction today.    History Javaris has a past medical history of Diabetes mellitus without complication ([place]), Hyperlipidemia, Hypertension, and PE (pulmonary embolism).   He has a past surgical history that includes Back surgery; Knee surgery; Lung surgery; Anterior cruciate ligament repair (Right); Wisdom tooth extraction; and Quadriceps tendon repair (Left, 09/23/2016).   His family history includes Heart disease in his brother and father.He reports that he has never smoked. He has never used smokeless tobacco. He reports that he does not drink alcohol or use drugs.  Current Outpatient Medications on File Prior to Visit  Medication Sig Dispense Refill  . cetirizine (ZYRTEC) 10 MG tablet Take 1 tablet (10 mg total) by mouth daily. For allergy symptoms 90 tablet 3  . docusate  sodium (COLACE) 100 MG capsule Take 200 mg by mouth 2 (two) times daily.    . cyclobenzaprine (FLEXERIL) 10 MG tablet TAKE 1 TABLET BY MOUTH THREE TIMES A DAY AS NEEDED FOR MUSCLE SPASMS (Patient not taking: Reported on 04/19/2018) 90 tablet 0  . Multiple Vitamins-Minerals (ONE-A-DAY MENS HEALTH FORMULA PO) Take 1 tablet by mouth daily.     No current facility-administered medications on file prior to visit.     ROS Review of Systems  Constitutional: Negative.   HENT: Negative.   Eyes: Negative for visual disturbance.  Respiratory: Negative for cough and shortness of breath.   Cardiovascular: Negative for chest pain and leg swelling.  Gastrointestinal: Negative for abdominal pain, diarrhea, nausea and vomiting.  Genitourinary: Negative for difficulty urinating.  Musculoskeletal: Positive for arthralgias (knees, back, everywhere - not eligible for tx due to eliquis use). Negative for myalgias.  Skin: Negative for rash.  Neurological: Negative for headaches.  Psychiatric/Behavioral: Negative for sleep disturbance.    Objective:  BP 120/85   Pulse 87   Temp (!) 97.1 F (36.2 C) (Oral)   Ht 5' 10" (1.778 m)   Wt 278 lb (126.1 kg)   BMI 39.89 kg/m   BP Readings from Last 3 Encounters:  04/19/18 120/85  12/28/17 122/79  10/22/17 130/85    Wt Readings from Last 3 Encounters:  04/19/18 278 lb (126.1 kg)  12/28/17 279 lb (126.6 kg)  10/22/17 278 lb (126.1 kg)     Physical Exam Vitals signs reviewed.  Constitutional:      Appearance: He is well-developed.  HENT:     Head: Normocephalic and atraumatic.     Right Ear: Tympanic membrane and   Subjective:  Patient ID: Brian Roach,  male    DOB: 02/27/1961  Age: 57 y.o.    CC: Medical Management of Chronic Issues (3 month )   HPI Isiah F Weyand presents for  follow-up of hypertension. Patient has no history of headache chest pain or shortness of breath or recent cough. Patient also denies symptoms of TIA such as numbness weakness lateralizing. Patient denies side effects from medication. States taking it regularly.  Patient also  in for follow-up of elevated cholesterol. Doing well without complaints on current medication. Denies side effects  including myalgia and arthralgia and nausea. Also in today for liver function testing. Currently no chest pain, shortness of breath or other cardiovascular related symptoms noted.  Follow-up of diabetes. Patient does check blood sugar at home. Readings run between 110 and 130. Filled out logs, forgot them. Will bring them by later. Patient denies symptoms such as excessive hunger or urinary frequency, excessive hunger, nausea No significant hypoglycemic spells noted. Medications reviewed. Pt reports taking them regularly. Pt. denies complication/adverse reaction today.    History Javien has a past medical history of Diabetes mellitus without complication (HCC), Hyperlipidemia, Hypertension, and PE (pulmonary embolism).   He has a past surgical history that includes Back surgery; Knee surgery; Lung surgery; Anterior cruciate ligament repair (Right); Wisdom tooth extraction; and Quadriceps tendon repair (Left, 09/23/2016).   His family history includes Heart disease in his brother and father.He reports that he has never smoked. He has never used smokeless tobacco. He reports that he does not drink alcohol or use drugs.  Current Outpatient Medications on File Prior to Visit  Medication Sig Dispense Refill  . cetirizine (ZYRTEC) 10 MG tablet Take 1 tablet (10 mg total) by mouth daily. For allergy symptoms 90 tablet 3  . docusate  sodium (COLACE) 100 MG capsule Take 200 mg by mouth 2 (two) times daily.    . cyclobenzaprine (FLEXERIL) 10 MG tablet TAKE 1 TABLET BY MOUTH THREE TIMES A DAY AS NEEDED FOR MUSCLE SPASMS (Patient not taking: Reported on 04/19/2018) 90 tablet 0  . Multiple Vitamins-Minerals (ONE-A-DAY MENS HEALTH FORMULA PO) Take 1 tablet by mouth daily.     No current facility-administered medications on file prior to visit.     ROS Review of Systems  Constitutional: Negative.   HENT: Negative.   Eyes: Negative for visual disturbance.  Respiratory: Negative for cough and shortness of breath.   Cardiovascular: Negative for chest pain and leg swelling.  Gastrointestinal: Negative for abdominal pain, diarrhea, nausea and vomiting.  Genitourinary: Negative for difficulty urinating.  Musculoskeletal: Positive for arthralgias (knees, back, everywhere - not eligible for tx due to eliquis use). Negative for myalgias.  Skin: Negative for rash.  Neurological: Negative for headaches.  Psychiatric/Behavioral: Negative for sleep disturbance.    Objective:  BP 120/85   Pulse 87   Temp (!) 97.1 F (36.2 C) (Oral)   Ht 5' 10" (1.778 m)   Wt 278 lb (126.1 kg)   BMI 39.89 kg/m   BP Readings from Last 3 Encounters:  04/19/18 120/85  12/28/17 122/79  10/22/17 130/85    Wt Readings from Last 3 Encounters:  04/19/18 278 lb (126.1 kg)  12/28/17 279 lb (126.6 kg)  10/22/17 278 lb (126.1 kg)     Physical Exam Vitals signs reviewed.  Constitutional:      Appearance: He is well-developed.  HENT:     Head: Normocephalic and atraumatic.     Right Ear: Tympanic membrane and

## 2018-04-19 NOTE — Addendum Note (Signed)
Addended by: Caryl Bis on: 04/19/2018 10:26 AM   Modules accepted: Orders

## 2018-04-20 NOTE — Progress Notes (Signed)
Hello Aboubacar,  Your lab result is normal.Some minor variations that are not significant are commonly marked abnormal, but do not represent any medical problem for you.  Best regards, Mariyam Remington, M.D.

## 2018-04-27 ENCOUNTER — Ambulatory Visit: Payer: BC Managed Care – PPO | Admitting: Nurse Practitioner

## 2018-04-27 ENCOUNTER — Encounter: Payer: Self-pay | Admitting: Nurse Practitioner

## 2018-04-27 VITALS — BP 125/75 | HR 117 | Temp 98.7°F | Ht 70.0 in | Wt 276.8 lb

## 2018-04-27 DIAGNOSIS — R52 Pain, unspecified: Secondary | ICD-10-CM

## 2018-04-27 DIAGNOSIS — J101 Influenza due to other identified influenza virus with other respiratory manifestations: Secondary | ICD-10-CM | POA: Diagnosis not present

## 2018-04-27 MED ORDER — OSELTAMIVIR PHOSPHATE 75 MG PO CAPS: 75.0000 mg | ORAL_CAPSULE | Freq: Two times a day (BID) | ORAL | 0 refills | Status: DC

## 2018-04-27 MED ORDER — BENZONATATE 100 MG PO CAPS: 100.0000 mg | ORAL_CAPSULE | Freq: Three times a day (TID) | ORAL | 0 refills | Status: DC | PRN

## 2018-04-27 MED ORDER — HYDROCODONE-HOMATROPINE 5-1.5 MG/5ML PO SYRP: 5.0000 mL | ORAL_SOLUTION | Freq: Four times a day (QID) | ORAL | 0 refills | Status: DC | PRN

## 2018-04-27 NOTE — Patient Instructions (Signed)

## 2018-04-27 NOTE — Progress Notes (Signed)
Subjective:    Patient ID: Brian Roach, male    DOB: 03/27/1960, 58 y.o.   MRN: [id number]   Chief Complaint: Cough; Fever; and Generalized Body Aches   HPI Patient brought his girlfriend in to be seen she was diagnosed wth flu. By this afternoon her started feeling bad and achy. Cough started.    Review of Systems  Constitutional: Positive for chills and fever (100.5).  HENT: Positive for congestion, rhinorrhea and sore throat. Negative for trouble swallowing.   Respiratory: Positive for cough.   Cardiovascular: Negative.   Genitourinary: Negative.   Musculoskeletal: Positive for myalgias.  Neurological: Positive for headaches.  Psychiatric/Behavioral: Negative.   All other systems reviewed and are negative.      Objective:   Physical Exam Vitals signs and nursing note reviewed.  Constitutional:      General: He is not in acute distress.    Appearance: Normal appearance. He is obese.  HENT:     Right Ear: Tympanic membrane, ear canal and external ear normal.     Left Ear: Tympanic membrane, ear canal and external ear normal.     Nose: Nose normal.  Eyes:     Extraocular Movements: Extraocular movements intact.     Pupils: Pupils are equal, round, and reactive to light.  Neck:     Musculoskeletal: Normal range of motion and neck supple.  Cardiovascular:     Rate and Rhythm: Normal rate and regular rhythm.     Heart sounds: Normal heart sounds.  Pulmonary:     Effort: Pulmonary effort is normal.     Breath sounds: Normal breath sounds.  Lymphadenopathy:     Cervical: No cervical adenopathy.  Skin:    General: Skin is warm and dry.  Neurological:     General: No focal deficit present.     Mental Status: He is alert and oriented to person, place, and time.  Psychiatric:        Mood and Affect: Mood normal.        Behavior: Behavior normal.   BP 125/75   Pulse (!) 117   Temp 98.7 F (37.1 C) (Oral)   Ht 5\' 10"  (1.778 m)   Wt 276 lb 12.8 oz (125.6 kg)    BMI 39.72 kg/m   Flu- positive A     Assessment & Plan:  Brian Roach in today with chief complaint of Cough; Fever; and Generalized Body Aches   1. Body aches - Veritor Flu A/B Waived  2. Influenza A 1. Take meds as prescribed 2. Use a cool mist humidifier especially during the winter months and when heat has been humid. 3. Use saline nose sprays frequently 4. Saline irrigations of the nose can be very helpful if done frequently.  * 4X daily for 1 week*  * Use of a nettie pot can be helpful with this. Follow directions with this* 5. Drink plenty of fluids 6. Keep thermostat turn down low 7.For any cough or congestion  Use plain Mucinex- regular strength or max strength is fine   * Children- consult with Pharmacist for dosing 8. For fever or aces or pains- take tylenol or ibuprofen appropriate for age and weight.  * for fevers greater than 101 orally you may alternate ibuprofen and tylenol every  3 hours.    Meds ordered this encounter  Medications  . oseltamivir (TAMIFLU) 75 MG capsule    Sig: Take 1 capsule (75 mg total) by mouth 2 (two) times daily.  Dispense:  10 capsule    Refill:  0    Order Specific Question:   Supervising Provider    Answer:   Arville Care A [id number]  . HYDROcodone-homatropine (HYCODAN) 5-1.5 MG/5ML syrup    Sig: Take 5 mLs by mouth every 6 (six) hours as needed for cough.    Dispense:  120 mL    Refill:  0    Order Specific Question:   Supervising Provider    Answer:   Nils Pyle [[id number]]   Mary-Margaret Daphine Deutscher, FNP

## 2018-04-28 LAB — VERITOR FLU A/B WAIVED
Influenza A: POSITIVE — AB
Influenza B: NEGATIVE

## 2018-06-16 ENCOUNTER — Encounter: Payer: Self-pay | Admitting: Family Medicine

## 2018-06-16 ENCOUNTER — Other Ambulatory Visit: Payer: Self-pay

## 2018-06-16 ENCOUNTER — Ambulatory Visit (INDEPENDENT_AMBULATORY_CARE_PROVIDER_SITE_OTHER): Payer: BC Managed Care – PPO | Admitting: Family Medicine

## 2018-06-16 DIAGNOSIS — E119 Type 2 diabetes mellitus without complications: Secondary | ICD-10-CM

## 2018-06-16 DIAGNOSIS — I10 Essential (primary) hypertension: Secondary | ICD-10-CM

## 2018-06-16 DIAGNOSIS — E7800 Pure hypercholesterolemia, unspecified: Secondary | ICD-10-CM

## 2018-06-16 DIAGNOSIS — Z7901 Long term (current) use of anticoagulants: Secondary | ICD-10-CM

## 2018-06-16 MED ORDER — CYCLOBENZAPRINE HCL 10 MG PO TABS: ORAL_TABLET | ORAL | 5 refills | Status: DC

## 2018-06-16 MED ORDER — PREDNISONE 10 MG PO TABS: ORAL_TABLET | ORAL | 0 refills | Status: DC

## 2018-06-16 NOTE — Progress Notes (Signed)
Subjective:  Patient ID: Brian Roach,  male    DOB: 03/09/1960  Age: 58 y.o.    CC: Diabetes; Hypertension; and Hyperlipidemia   HPI Brian Roach presents for  follow-up of hypertension. Patient has no history of headache chest pain or shortness of breath or recent cough. Patient also denies symptoms of TIA such as numbness weakness lateralizing. Patient denies side effects from medication. States taking it regularly.  Patient also  in for follow-up of elevated cholesterol. Doing well without complaints on current medication. Denies side effects  including myalgia and arthralgia and nausea. Also in today for liver function testing. Currently no chest pain, shortness of breath or other cardiovascular related symptoms noted.  Follow-up of diabetes. Patient does check blood sugar at home. Readings run between 90-100 fasting and 130- 150 post prandial Patient denies symptoms such as excessive hunger or urinary frequency, excessive hunger, nausea No significant hypoglycemic spells noted. Medications reviewed. Pt reports taking them regularly. Pt. denies complication/adverse reaction today.    History Brian Roach has a past medical history of Diabetes mellitus without complication (HCC), Hyperlipidemia, Hypertension, and PE (pulmonary embolism).   He has a past surgical history that includes Back surgery; Knee surgery; Lung surgery; Anterior cruciate ligament repair (Right); Wisdom tooth extraction; and Quadriceps tendon repair (Left, 09/23/2016).   His family history includes Heart disease in his brother and father.He reports that he has never smoked. He has never used smokeless tobacco. He reports that he does not drink alcohol or use drugs.  Current Outpatient Medications on File Prior to Visit  Medication Sig Dispense Refill  . apixaban (ELIQUIS) 5 MG TABS tablet Take 1 tablet (5 mg total) by mouth 2 (two) times daily. 60 tablet 5  . atorvastatin (LIPITOR) 20 MG tablet Take 1 tablet  (20 mg total) by mouth every evening. 90 tablet 1  . benazepril (LOTENSIN) 20 MG tablet Take 1 tablet (20 mg total) by mouth daily. 90 tablet 1  . cetirizine (ZYRTEC) 10 MG tablet Take 1 tablet (10 mg total) by mouth daily. For allergy symptoms 90 tablet 3  . diltiazem (CARDIZEM CD) 360 MG 24 hr capsule Take 1 capsule (360 mg total) by mouth daily. 90 capsule 1  . docusate sodium (COLACE) 100 MG capsule Take 200 mg by mouth 2 (two) times daily.    . metFORMIN (GLUCOPHAGE-XR) 500 MG 24 hr tablet Take 1 tablet (500 mg total) by mouth daily with breakfast. 90 tablet 1  . Multiple Vitamins-Minerals (ONE-A-DAY MENS HEALTH FORMULA PO) Take 1 tablet by mouth daily.    Marland Kitchen. omeprazole (PRILOSEC) 20 MG capsule Take 1 capsule (20 mg total) by mouth daily. 90 capsule 1  . trazodone (DESYREL) 300 MG tablet TAKE 1 TABLET BY MOUTH EVERYDAY AT BEDTIME 90 tablet 1   No current facility-administered medications on file prior to visit.     ROS Review of Systems  Constitutional: Negative.  Negative for fever.  HENT: Positive for congestion.   Eyes: Negative for visual disturbance.  Respiratory: Positive for chest tightness (occasional) and wheezing (at night for a week. Described as mild). Negative for cough and shortness of breath.   Cardiovascular: Negative for chest pain and leg swelling.  Gastrointestinal: Negative for abdominal pain, diarrhea, nausea and vomiting.  Genitourinary: Negative for difficulty urinating.  Musculoskeletal: Negative for arthralgias and myalgias.  Skin: Negative for rash.  Neurological: Negative for headaches.  Psychiatric/Behavioral: Negative for sleep disturbance.    Objective:  There were no vitals taken for  this visit.  BP Readings from Last 3 Encounters:  04/27/18 125/75  04/19/18 120/85  12/28/17 122/79    Wt Readings from Last 3 Encounters:  04/27/18 276 lb 12.8 oz (125.6 kg)  04/19/18 278 lb (126.1 kg)  12/28/17 279 lb (126.6 kg)     Physical Exam  Exam  deferred. Pt. Harboring due to COVID 19. Phone visit performed.     Assessment & Plan:   Matsuo was seen today for diabetes, hypertension and hyperlipidemia.  Diagnoses and all orders for this visit:  Diabetes mellitus without complication (HCC)  HTN (hypertension), benign  Hypercholesteremia  Long term current use of anticoagulant  Other orders -     cyclobenzaprine (FLEXERIL) 10 MG tablet; TAKE 1 TABLET BY MOUTH THREE TIMES A DAY AS NEEDED FOR MUSCLE SPASMS -     predniSONE (DELTASONE) 10 MG tablet; Take 5 daily for 2 days followed by 4,3,2 and 1 for 2 days each.   I have discontinued Titus Dubin. Weatherman's oseltamivir, HYDROcodone-homatropine, and benzonatate. I am also having him start on predniSONE. Additionally, I am having him maintain his docusate sodium, Multiple Vitamins-Minerals (ONE-A-DAY MENS HEALTH FORMULA PO), cetirizine, trazodone, omeprazole, metFORMIN, diltiazem, benazepril, atorvastatin, apixaban, and cyclobenzaprine.  Meds ordered this encounter  Medications  . cyclobenzaprine (FLEXERIL) 10 MG tablet    Sig: TAKE 1 TABLET BY MOUTH THREE TIMES A DAY AS NEEDED FOR MUSCLE SPASMS    Dispense:  90 tablet    Refill:  5  . predniSONE (DELTASONE) 10 MG tablet    Sig: Take 5 daily for 2 days followed by 4,3,2 and 1 for 2 days each.    Dispense:  30 tablet    Refill:  0   Virtual Visit via telephone Note  I discussed the limitations, risks, security and privacy concerns of performing an evaluation and management service by telephone and the availability of in person appointments. I also discussed with the patient that there may be a patient responsible charge related to this service. The patient expressed understanding and agreed to proceed. Pt. Is at home. [name] is in his office.  Follow Up Instructions:   I discussed the assessment and treatment plan with the patient. The patient was provided an opportunity to ask questions and all were answered. The patient  agreed with the plan and demonstrated an understanding of the instructions.   The patient was advised to call back or seek an in-person evaluation if the symptoms worsen or if the condition fails to improve as anticipated.  Visit started: 8:40 Call ended:  9:00 Total minutes including chart review and phone contact time: 25   Follow-up: Return in about 3 months (around 09/15/2018) for diabetes.  [name], M.D.

## 2018-06-27 ENCOUNTER — Other Ambulatory Visit: Payer: Self-pay

## 2018-06-27 ENCOUNTER — Encounter: Payer: Self-pay | Admitting: Family Medicine

## 2018-06-27 ENCOUNTER — Ambulatory Visit (INDEPENDENT_AMBULATORY_CARE_PROVIDER_SITE_OTHER): Payer: BC Managed Care – PPO | Admitting: Family Medicine

## 2018-06-27 DIAGNOSIS — K582 Mixed irritable bowel syndrome: Secondary | ICD-10-CM | POA: Diagnosis not present

## 2018-06-27 MED ORDER — LINACLOTIDE 290 MCG PO CAPS: 290.0000 ug | ORAL_CAPSULE | Freq: Every day | ORAL | 2 refills | Status: DC

## 2018-06-27 NOTE — Progress Notes (Signed)
Subjective:    Patient ID: Brian Roach, male    DOB: 1960-10-05, 58 y.o.   MRN: [id number]   HPI: Brian Roach is a 58 y.o. male presenting for stomach bloating like a balloon, grumbling and tight. Can't get a deep breath.BMS are irregular. Intermittent constipation and diarrhea. BMs sometimes very hard. Some burping. No flatulence. Denies cholecystectomy. No heartburn or indigestion. Gurgling to left of breastbone, LUQ. Onset was  2 weeks ago   Depression screen [facility] 2/9 04/27/2018 04/19/2018 12/28/2017 10/03/2017 06/10/2017  Decreased Interest 0 0 0 0 0  Down, Depressed, Hopeless 0 0 0 0 0  PHQ - 2 Score 0 0 0 0 0     Relevant past medical, surgical, family and social history reviewed and updated as indicated.  Interim medical history since our last visit reviewed. Allergies and medications reviewed and updated.  ROS:  Review of Systems  Constitutional: Negative for chills, diaphoresis, fever and unexpected weight change.  HENT: Negative for rhinorrhea and trouble swallowing.   Respiratory: Negative for cough, chest tightness and shortness of breath.   Cardiovascular: Negative for chest pain.  Gastrointestinal: Positive for abdominal distention, abdominal pain, constipation and diarrhea. Negative for blood in stool, nausea, rectal pain and vomiting.  Genitourinary: Negative for dysuria, flank pain and hematuria.  Musculoskeletal: Negative for arthralgias and joint swelling.  Skin: Negative for rash.     Social History   Tobacco Use  Smoking Status Never Smoker  Smokeless Tobacco Never Used       Objective:     Wt Readings from Last 3 Encounters:  04/27/18 276 lb 12.8 oz (125.6 kg)  04/19/18 278 lb (126.1 kg)  12/28/17 279 lb (126.6 kg)     Exam deferred. Pt. Harboring due to COVID 19. Phone visit performed.   Assessment & Plan:   1. Irritable bowel syndrome with both constipation and diarrhea     Meds ordered this encounter  Medications  . linaclotide  (LINZESS) 290 MCG CAPS capsule    Sig: Take 1 capsule (290 mcg total) by mouth daily. To regulate bowel movements    Dispense:  30 capsule    Refill:  2    No orders of the defined types were placed in this encounter.     Diagnoses and all orders for this visit:  Irritable bowel syndrome with both constipation and diarrhea  Other orders -     linaclotide (LINZESS) 290 MCG CAPS capsule; Take 1 capsule (290 mcg total) by mouth daily. To regulate bowel movements    Virtual Visit via telephone Note  I discussed the limitations, risks, security and privacy concerns of performing an evaluation and management service by telephone and the availability of in person appointments. The patient was identified with two identifiers. Pt.expressed understanding and agreed to proceed. Pt. Is at home. [name] is in his office.  Follow Up Instructions:   I discussed the assessment and treatment plan with the patient. The patient was provided an opportunity to ask questions and all were answered. The patient agreed with the plan and demonstrated an understanding of the instructions.   The patient was advised to call back or seek an in-person evaluation if the symptoms worsen or if the condition fails to improve as anticipated.  Visit started: 2:07 Call ended:  2:21 Total minutes including chart review and phone contact time: 22   Follow up plan: Return in about 2 weeks (around 07/11/2018), or if symptoms worsen or fail to improve.  Claretta Fraise, MD [place]

## 2018-07-19 ENCOUNTER — Ambulatory Visit: Payer: BC Managed Care – PPO | Admitting: Family Medicine

## 2018-09-29 ENCOUNTER — Other Ambulatory Visit: Payer: Self-pay | Admitting: Family Medicine

## 2018-10-03 ENCOUNTER — Telehealth: Payer: Self-pay | Admitting: Family Medicine

## 2018-10-03 ENCOUNTER — Other Ambulatory Visit: Payer: Self-pay

## 2018-10-03 NOTE — Telephone Encounter (Signed)
Please contact the patient to screen justra sk him about fever, dyspnea and other relevant questions.

## 2018-10-03 NOTE — Telephone Encounter (Signed)
Patient only complains of a cough and nasal congestion which he states he has had for a while.  no fever, no dyspnea or any other symptoms

## 2018-10-04 ENCOUNTER — Encounter: Payer: Self-pay | Admitting: Family Medicine

## 2018-10-04 ENCOUNTER — Ambulatory Visit (INDEPENDENT_AMBULATORY_CARE_PROVIDER_SITE_OTHER): Payer: BC Managed Care – PPO | Admitting: Family Medicine

## 2018-10-04 DIAGNOSIS — Z7901 Long term (current) use of anticoagulants: Secondary | ICD-10-CM | POA: Diagnosis not present

## 2018-10-04 DIAGNOSIS — I471 Supraventricular tachycardia: Secondary | ICD-10-CM | POA: Diagnosis not present

## 2018-10-04 DIAGNOSIS — M150 Primary generalized (osteo)arthritis: Secondary | ICD-10-CM

## 2018-10-04 DIAGNOSIS — I10 Essential (primary) hypertension: Secondary | ICD-10-CM

## 2018-10-04 DIAGNOSIS — E119 Type 2 diabetes mellitus without complications: Secondary | ICD-10-CM | POA: Diagnosis not present

## 2018-10-04 DIAGNOSIS — M159 Polyosteoarthritis, unspecified: Secondary | ICD-10-CM

## 2018-10-04 DIAGNOSIS — Z86711 Personal history of pulmonary embolism: Secondary | ICD-10-CM

## 2018-10-04 MED ORDER — ATORVASTATIN CALCIUM 20 MG PO TABS: 20.0000 mg | ORAL_TABLET | Freq: Every evening | ORAL | 1 refills | Status: DC

## 2018-10-04 MED ORDER — TRAZODONE HCL 300 MG PO TABS: ORAL_TABLET | ORAL | 1 refills | Status: DC

## 2018-10-04 MED ORDER — DILTIAZEM HCL ER COATED BEADS 360 MG PO CP24: 360.0000 mg | ORAL_CAPSULE | Freq: Every day | ORAL | 1 refills | Status: DC

## 2018-10-04 MED ORDER — METFORMIN HCL ER 750 MG PO TB24: 750.0000 mg | ORAL_TABLET | Freq: Every day | ORAL | 1 refills | Status: DC

## 2018-10-04 MED ORDER — APIXABAN 5 MG PO TABS: 5.0000 mg | ORAL_TABLET | Freq: Two times a day (BID) | ORAL | 5 refills | Status: DC

## 2018-10-04 MED ORDER — OMEPRAZOLE 20 MG PO CPDR: 20.0000 mg | DELAYED_RELEASE_CAPSULE | Freq: Every day | ORAL | 1 refills | Status: DC

## 2018-10-04 MED ORDER — BENAZEPRIL HCL 20 MG PO TABS: 20.0000 mg | ORAL_TABLET | Freq: Every day | ORAL | 1 refills | Status: DC

## 2018-10-04 MED ORDER — LINACLOTIDE 290 MCG PO CAPS: ORAL_CAPSULE | ORAL | 0 refills | Status: DC

## 2018-10-04 NOTE — Progress Notes (Signed)
Subjective:    Patient ID: Brian Roach, male    DOB: 07/21/1960, 58 y.o.   MRN: [id number]   HPI: Brian [employer] is a 58 y.o. male presenting for follow-up of diabetes. Patient does check blood sugar at homeFasting 140-150. PP running 160-180 Patient denies symptoms such as polyuria, polydipsia, excessive hunger, nausea No significant hypoglycemic spells noted. Medications as noted below. Taking them regularly without complication/adverse reaction being reported today. He still has some back pain related to his arthritis.  He has the Flexeril but not using it regularly.  He is not doing any type of back exercises currently.  He denies weight gain but has not lost any.  He continues to use blood thinner   Patient in for follow-up of supraventricular tachycardia and pulmonary embolism.  Patient denies any recent bouts of chest pain or palpitations. Additionally, patient is taking anticoagulants. Patient denies any recent excessive bleeding episodes including epistaxis, bleeding from the gums, genitalia, rectal bleeding or hematuria. Additionally there has been no excessive bruising.  [facility] 04/27/2018 04/19/2018 12/28/2017 10/03/2017 06/10/2017  Decreased Interest 0 0 0 0 0  Down, Depressed, Hopeless 0 0 0 0 0  PHQ - 2 Score 0 0 0 0 0     Relevant past medical, surgical, family and social history reviewed and updated as indicated.  Interim medical history since our last visit reviewed. Allergies and medications reviewed and updated.  ROS:  Review of Systems  Constitutional: Positive for fatigue (chronic). Negative for fever.  HENT: Negative.   Eyes: Negative for visual disturbance.  Respiratory: Positive for cough. Negative for shortness of breath.   Cardiovascular: Negative for chest pain and leg swelling.  Gastrointestinal: Negative for abdominal pain, diarrhea, nausea and vomiting.  Genitourinary: Negative for difficulty urinating.  Musculoskeletal:  Positive for arthralgias and back pain. Negative for myalgias.  Skin: Negative for rash.  Neurological: Negative for headaches.  Psychiatric/Behavioral: Negative for sleep disturbance.     Social History   Tobacco Use  Smoking Status Never Smoker  Smokeless Tobacco Never Used       Objective:     Wt Readings from Last 3 Encounters:  04/27/18 276 lb 12.8 oz (125.6 kg)  04/19/18 278 lb (126.1 kg)  12/28/17 279 lb (126.6 kg)     Exam deferred. Pt. Harboring due to COVID 19. Phone visit performed.   Assessment & Plan:   1. Diabetes mellitus without complication (HCC)   2. Long term current use of anticoagulant   3. Paroxysmal SVT (supraventricular tachycardia) (HCC)   4. Primary osteoarthritis involving multiple joints   5. History of pulmonary embolus (PE)   6. HTN (hypertension), benign     Meds ordered this encounter  Medications  . apixaban (ELIQUIS) 5 MG TABS tablet    Sig: Take 1 tablet (5 mg total) by mouth 2 (two) times daily.    Dispense:  60 tablet    Refill:  5  . atorvastatin (LIPITOR) 20 MG tablet    Sig: Take 1 tablet (20 mg total) by mouth every evening.    Dispense:  90 tablet    Refill:  1  . diltiazem (CARDIZEM CD) 360 MG 24 hr capsule    Sig: Take 1 capsule (360 mg total) by mouth daily.    Dispense:  90 capsule    Refill:  1  . benazepril (LOTENSIN) 20 MG tablet    Sig: Take 1 tablet (20 mg total) by mouth daily.  Dispense:  90 tablet    Refill:  1  . linaclotide (LINZESS) 290 MCG CAPS capsule    Sig: TAKE 1 CAPSULE (290 MCG TOTAL) BY MOUTH DAILY. TO REGULATE BOWEL MOVEMENTS    Dispense:  90 capsule    Refill:  0  . omeprazole (PRILOSEC) 20 MG capsule    Sig: Take 1 capsule (20 mg total) by mouth daily.    Dispense:  90 capsule    Refill:  1  . trazodone (DESYREL) 300 MG tablet    Sig: TAKE 1 TABLET BY MOUTH EVERYDAY AT BEDTIME    Dispense:  90 tablet    Refill:  1  . metFORMIN (GLUCOPHAGE-XR) 750 MG 24 hr tablet    Sig: Take 1  tablet (750 mg total) by mouth daily with breakfast.    Dispense:  90 tablet    Refill:  1     Diagnoses and all orders for this visit:  Diabetes mellitus without complication ([place])  Long term current use of anticoagulant  Paroxysmal SVT (supraventricular tachycardia) (HCC)  Primary osteoarthritis involving multiple joints  History of pulmonary embolus (PE)  HTN (hypertension), benign  Other orders -     apixaban (ELIQUIS) 5 MG TABS tablet; Take 1 tablet (5 mg total) by mouth 2 (two) times daily. -     atorvastatin (LIPITOR) 20 MG tablet; Take 1 tablet (20 mg total) by mouth every evening. -     diltiazem (CARDIZEM CD) 360 MG 24 hr capsule; Take 1 capsule (360 mg total) by mouth daily. -     benazepril (LOTENSIN) 20 MG tablet; Take 1 tablet (20 mg total) by mouth daily. -     linaclotide (LINZESS) 290 MCG CAPS capsule; TAKE 1 CAPSULE (290 MCG TOTAL) BY MOUTH DAILY. TO REGULATE BOWEL MOVEMENTS -     omeprazole (PRILOSEC) 20 MG capsule; Take 1 capsule (20 mg total) by mouth daily. -     trazodone (DESYREL) 300 MG tablet; TAKE 1 TABLET BY MOUTH EVERYDAY AT BEDTIME -     metFORMIN (GLUCOPHAGE-XR) 750 MG 24 hr tablet; Take 1 tablet (750 mg total) by mouth daily with breakfast.  Pt. Is stable with each condition except that I increased his metformin due to elevated glucose at home. I also gave him a set of back exercises to access via MyChart due to recent increase in his baseline arthritis pain.  Virtual Visit via telephone Note  I discussed the limitations, risks, security and privacy concerns of performing an evaluation and management service by telephone and the availability of in person appointments. The patient was identified with two identifiers. Pt.expressed understanding and agreed to proceed. Pt. Is at home. Dr. Livia Snellen is in his office.  Follow Up Instructions:   I discussed the assessment and treatment plan with the patient. The patient was provided an opportunity to ask  questions and all were answered. The patient agreed with the plan and demonstrated an understanding of the instructions.   The patient was advised to call back or seek an in-person evaluation if the symptoms worsen or if the condition fails to improve as anticipated.   Total minutes including chart review and phone contact time: 28   Follow up plan: Return in about 3 months (around 01/04/2019).  Claretta Fraise, MD [place]

## 2018-10-04 NOTE — Patient Instructions (Signed)

## 2018-11-10 IMAGING — DX DG LUMBAR SPINE 2-3V
3 series · 3 of 3 positions shown · non-contrast
Comparison: 07/23/2013

CLINICAL DATA: Increasing low back pain.

EXAM:
LUMBAR SPINE - 2-3 VIEW

[l-spine ap]
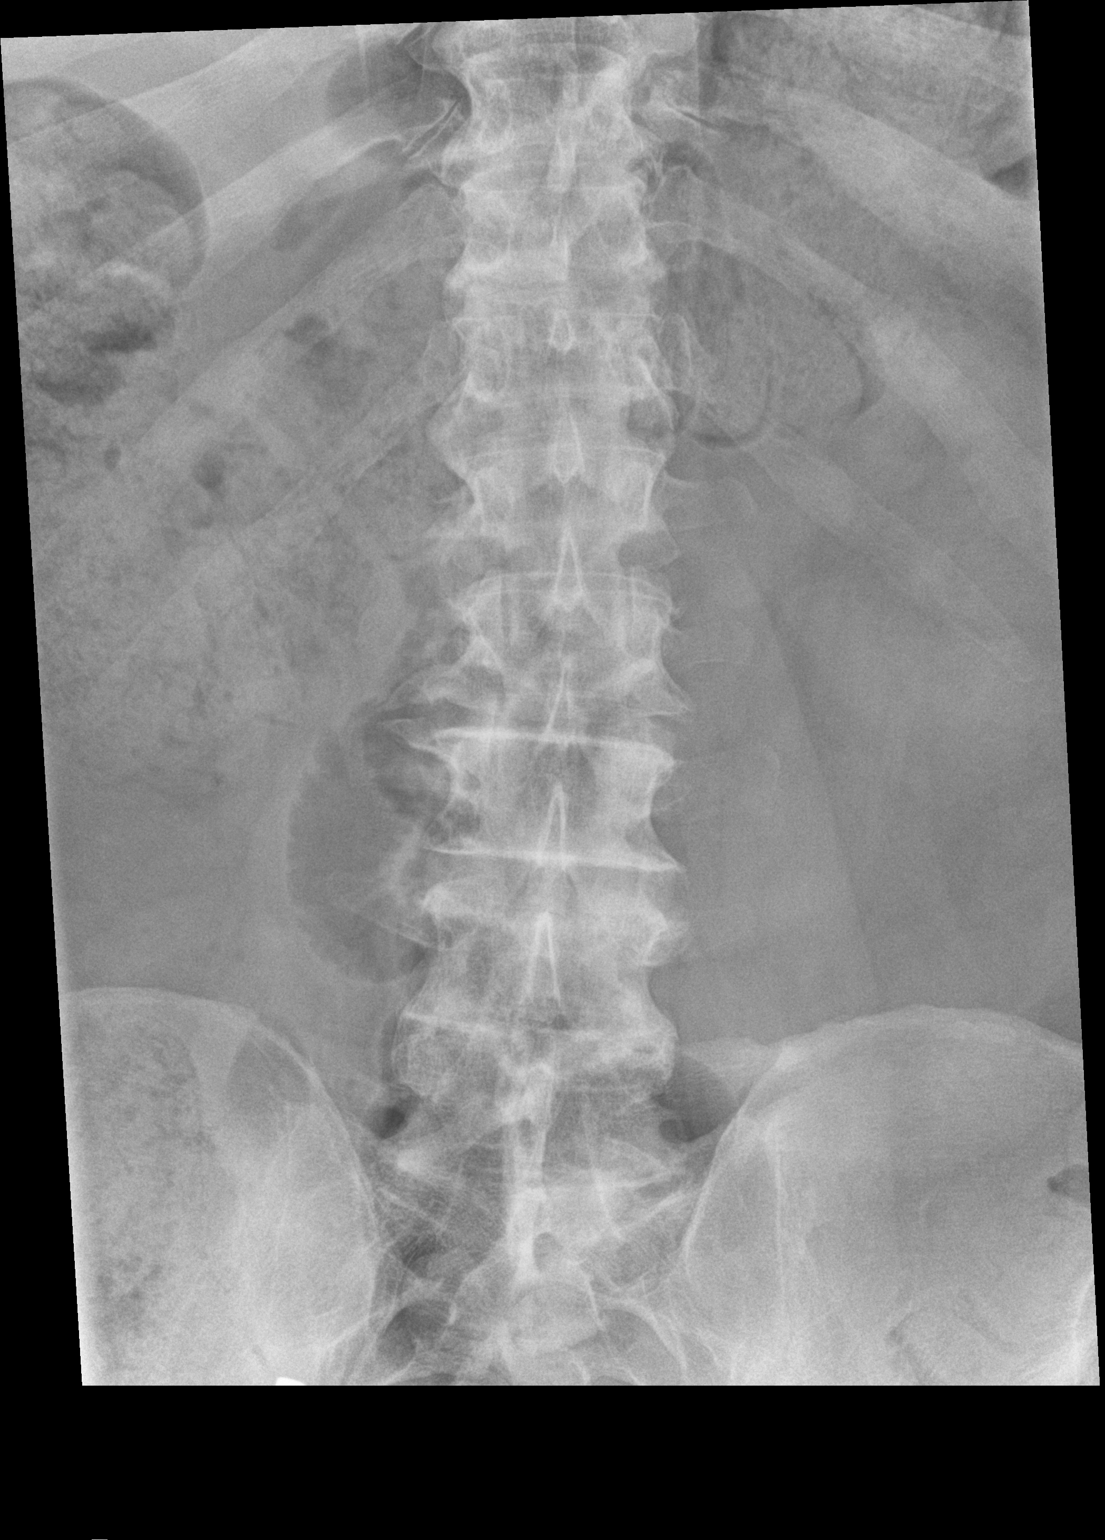

[l-spine lat (1 of 2)]
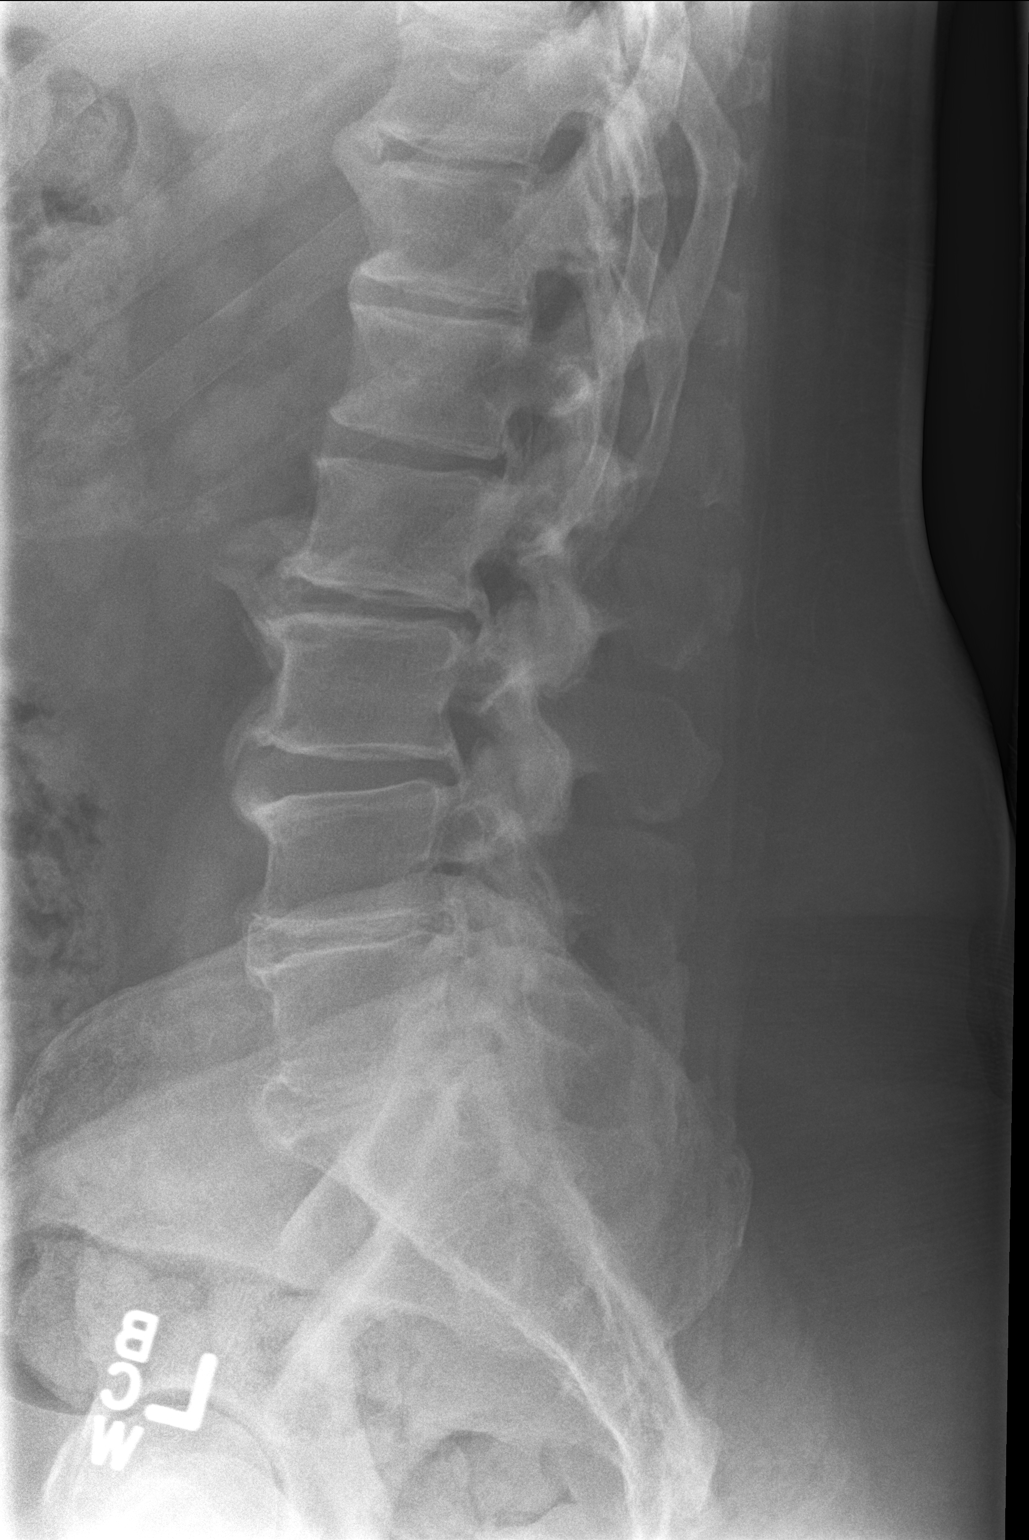

[l-spine lat (2 of 2)]
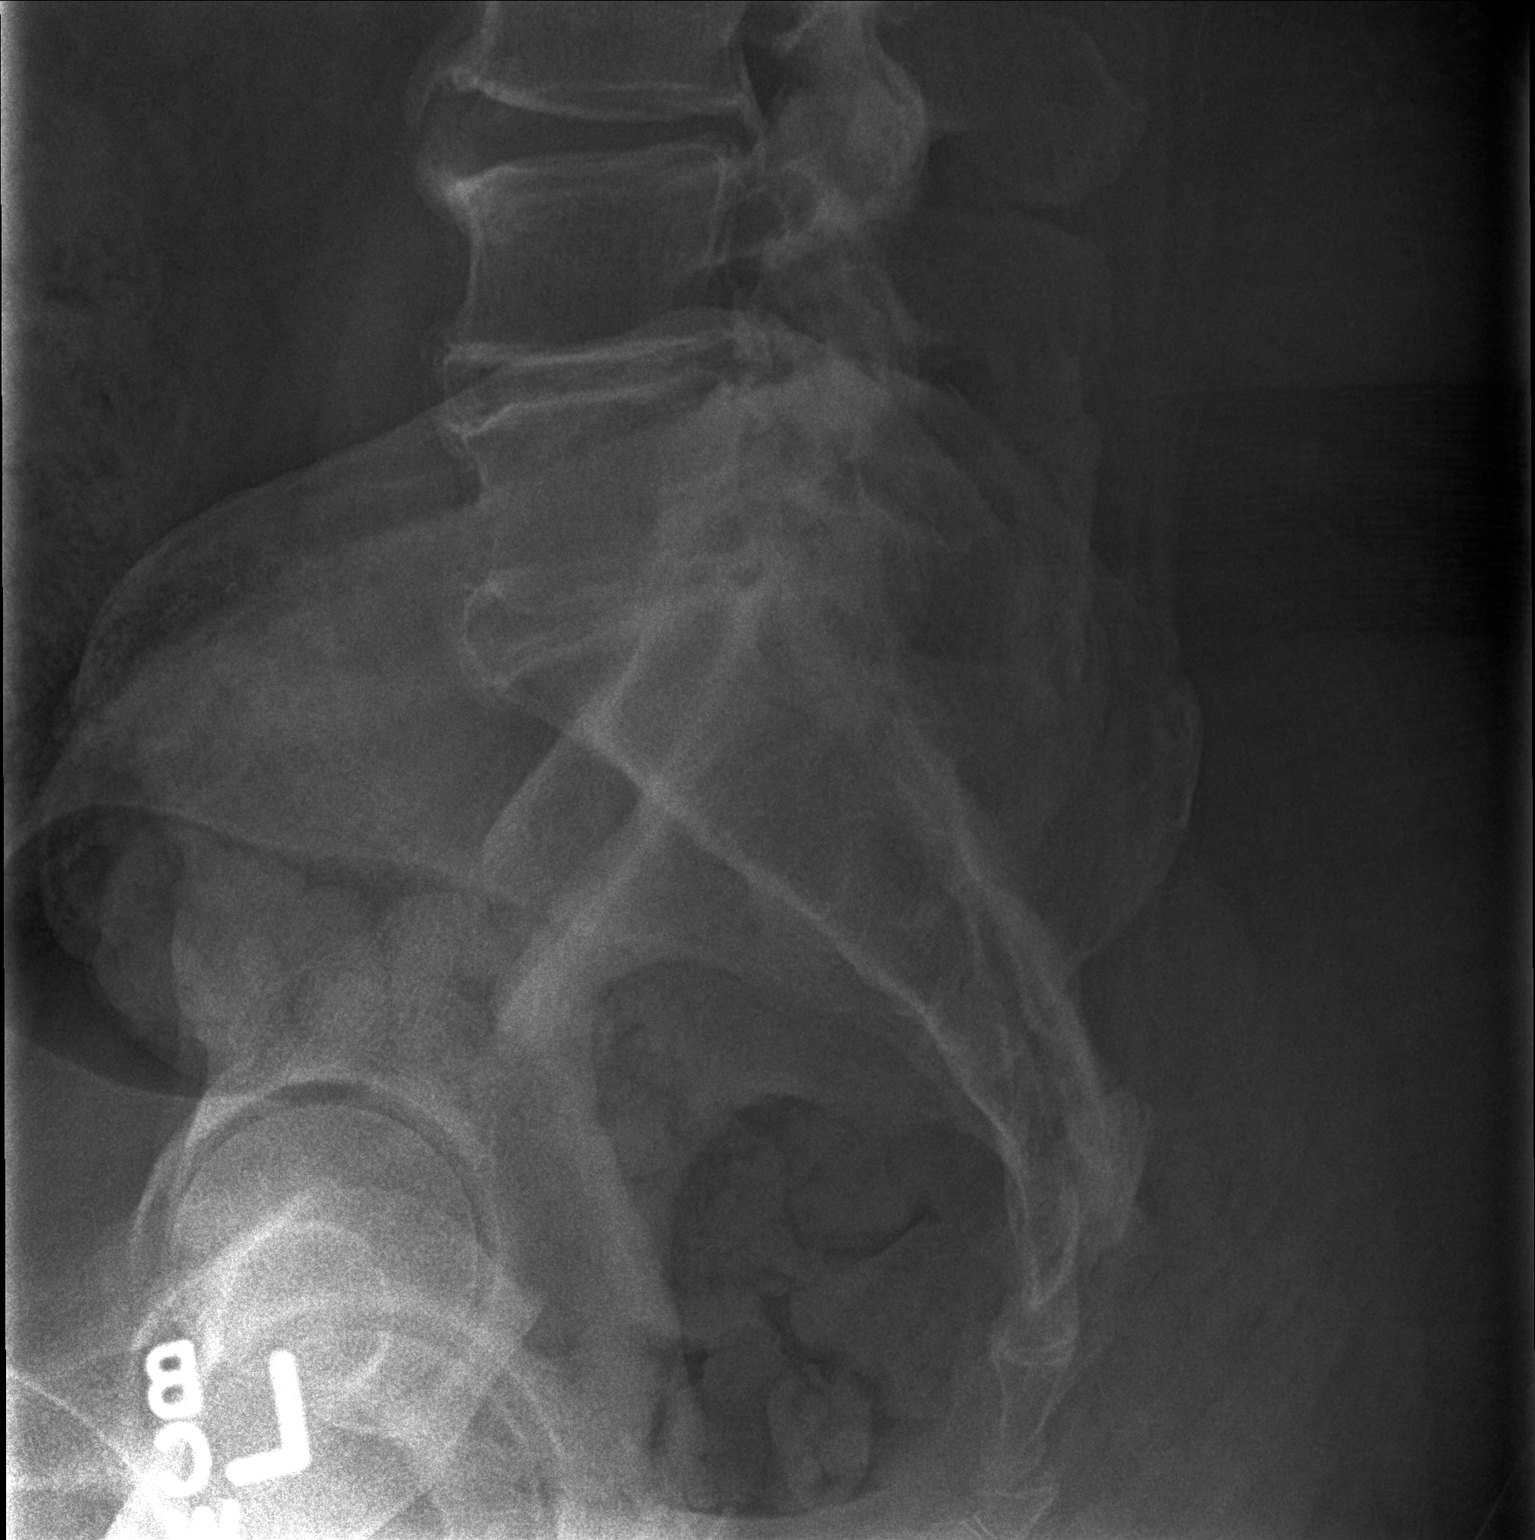

[3 of 3 positions shown; findings below may reference images not displayed]

FINDINGS: Degenerative disc disease at L2-3, L4-5, and L5-S1 with osteophytes
protruding into the spinal canal throughout the lumbar spine.
Congenitally short pedicles at L4 and L5. Hypertrophy of the facet
joints at L3-4 and L4-5 and L5-S1.
IMPRESSION: No acute abnormality. Multilevel degenerative disc disease with
probable spinal stenosis at multiple levels.

## 2018-12-15 ENCOUNTER — Encounter: Payer: Self-pay | Admitting: Family Medicine

## 2018-12-15 ENCOUNTER — Ambulatory Visit (INDEPENDENT_AMBULATORY_CARE_PROVIDER_SITE_OTHER): Payer: BC Managed Care – PPO | Admitting: Family Medicine

## 2018-12-15 DIAGNOSIS — R05 Cough: Secondary | ICD-10-CM

## 2018-12-15 DIAGNOSIS — J0110 Acute frontal sinusitis, unspecified: Secondary | ICD-10-CM | POA: Diagnosis not present

## 2018-12-15 DIAGNOSIS — R059 Cough, unspecified: Secondary | ICD-10-CM

## 2018-12-15 MED ORDER — GUAIFENESIN ER 600 MG PO TB12: 600.0000 mg | ORAL_TABLET | Freq: Two times a day (BID) | ORAL | 0 refills | Status: AC

## 2018-12-15 MED ORDER — PREDNISONE 20 MG PO TABS: ORAL_TABLET | ORAL | 0 refills | Status: DC

## 2018-12-15 MED ORDER — FLUTICASONE PROPIONATE 50 MCG/ACT NA SUSP: 2.0000 | Freq: Every day | NASAL | 6 refills | Status: DC

## 2018-12-15 MED ORDER — AMOXICILLIN-POT CLAVULANATE 875-125 MG PO TABS: 1.0000 | ORAL_TABLET | Freq: Two times a day (BID) | ORAL | 0 refills | Status: AC

## 2018-12-15 NOTE — Progress Notes (Signed)
Virtual Visit via telephone Note Due to COVID-19 pandemic this visit was conducted virtually. This visit type was conducted due to national recommendations for restrictions regarding the COVID-19 Pandemic (e.g. social distancing, sheltering in place) in an effort to limit this patient's exposure and mitigate transmission in our community. All issues noted in this document were discussed and addressed.  A physical exam was not performed with this format.   I connected with Brian Roach on 12/15/18 at 1310 by telephone and verified that I am speaking with the correct person using two identifiers. Brian Roach is currently located at home and no one is currently with them during visit. The provider, Kari Baars, FNP is located in their office at time of visit.  I discussed the limitations, risks, security and privacy concerns of performing an evaluation and management service by telephone and the availability of in person appointments. I also discussed with the patient that there may be a patient responsible charge related to this service. The patient expressed understanding and agreed to proceed.  Subjective:  Patient ID: Brian Roach, male    DOB: Sep 22, 1960, 58 y.o.   MRN: [id number]  Chief Complaint:  Sinus Problem   HPI: Brian Roach is a 58 y.o. male presenting on 12/15/2018 for Sinus Problem   Pt reports over 1 month of frontal sinus pressure that is not responding to symptomatic care at home. He has a cough with sputum production with the sinus pressure. States the pain is worse with bending over. He has had chills but has not checked his temperature.   Sinus Problem This is a new problem. The current episode started more than 1 month ago. The problem has been gradually worsening since onset. There has been no fever. His pain is at a severity of 4/10. The pain is mild. Associated symptoms include chills, congestion, coughing, headaches, sinus pressure, sneezing, a sore  throat and swollen glands. Pertinent negatives include no diaphoresis, ear pain, hoarse voice, neck pain or shortness of breath. Past treatments include acetaminophen, saline sprays and spray decongestants. The treatment provided no relief.     Relevant past medical, surgical, family, and social history reviewed and updated as indicated.  Allergies and medications reviewed and updated.   Past Medical History:  Diagnosis Date  . Diabetes mellitus without complication (HCC)   . Hyperlipidemia   . Hypertension   . PE (pulmonary embolism)     Past Surgical History:  Procedure Laterality Date  . ANTERIOR CRUCIATE LIGAMENT REPAIR Right   . BACK SURGERY    . KNEE SURGERY    . LUNG SURGERY     Unspecified but was done for PE. Had chest tubes. Approx 2003  . QUADRICEPS TENDON REPAIR Left 09/23/2016   Procedure: REPAIR QUADRICEP TENDON;  Surgeon: Vickki Hearing, MD;  Location: [facility];  Service: Orthopedics;  Laterality: Left;  . WISDOM TOOTH EXTRACTION      Social History   Socioeconomic History  . Marital status: Divorced    Spouse name: Not on file  . Number of children: Not on file  . Years of education: Not on file  . Highest education level: Not on file  Occupational History  . Not on file  Social Needs  . Financial resource strain: Not on file  . Food insecurity    Worry: Not on file    Inability: Not on file  . Transportation needs    Medical: Not on file    Non-medical: Not on  file  Tobacco Use  . Smoking status: Never Smoker  . Smokeless tobacco: Never Used  Substance and Sexual Activity  . Alcohol use: No  . Drug use: No  . Sexual activity: Yes  Lifestyle  . Physical activity    Days per week: Not on file    Minutes per session: Not on file  . Stress: Not on file  Relationships  . Social Musicianconnections    Talks on phone: Not on file    Gets together: Not on file    Attends religious service: Not on file    Active member of club or organization: Not on  file    Attends meetings of clubs or organizations: Not on file    Relationship status: Not on file  . Intimate partner violence    Fear of current or ex partner: Not on file    Emotionally abused: Not on file    Physically abused: Not on file    Forced sexual activity: Not on file  Other Topics Concern  . Not on file  Social History Narrative  . Not on file    Outpatient Encounter Medications as of 12/15/2018  Medication Sig  . amoxicillin-clavulanate (AUGMENTIN) 875-125 MG tablet Take 1 tablet by mouth 2 (two) times daily for 10 days.  Marland Kitchen. apixaban (ELIQUIS) 5 MG TABS tablet Take 1 tablet (5 mg total) by mouth 2 (two) times daily.  Marland Kitchen. atorvastatin (LIPITOR) 20 MG tablet Take 1 tablet (20 mg total) by mouth every evening.  . benazepril (LOTENSIN) 20 MG tablet Take 1 tablet (20 mg total) by mouth daily.  . cetirizine (ZYRTEC) 10 MG tablet Take 1 tablet (10 mg total) by mouth daily. For allergy symptoms  . cyclobenzaprine (FLEXERIL) 10 MG tablet TAKE 1 TABLET BY MOUTH THREE TIMES A DAY AS NEEDED FOR MUSCLE SPASMS  . diltiazem (CARDIZEM CD) 360 MG 24 hr capsule Take 1 capsule (360 mg total) by mouth daily.  Marland Kitchen. docusate sodium (COLACE) 100 MG capsule Take 200 mg by mouth 2 (two) times daily.  . fluticasone (FLONASE) 50 MCG/ACT nasal spray Place 2 sprays into both nostrils daily.  Marland Kitchen. guaiFENesin (MUCINEX) 600 MG 12 hr tablet Take 1 tablet (600 mg total) by mouth 2 (two) times daily for 14 days.  Marland Kitchen. linaclotide (LINZESS) 290 MCG CAPS capsule TAKE 1 CAPSULE (290 MCG TOTAL) BY MOUTH DAILY. TO REGULATE BOWEL MOVEMENTS  . metFORMIN (GLUCOPHAGE-XR) 750 MG 24 hr tablet Take 1 tablet (750 mg total) by mouth daily with breakfast.  . Multiple Vitamins-Minerals (ONE-A-DAY MENS HEALTH FORMULA PO) Take 1 tablet by mouth daily.  Marland Kitchen. omeprazole (PRILOSEC) 20 MG capsule Take 1 capsule (20 mg total) by mouth daily.  . predniSONE (DELTASONE) 20 MG tablet 2 po at sametime daily for 5 days  . trazodone (DESYREL) 300  MG tablet TAKE 1 TABLET BY MOUTH EVERYDAY AT BEDTIME   No facility-administered encounter medications on file as of 12/15/2018.     No Known Allergies  Review of Systems  Constitutional: Positive for chills. Negative for activity change, appetite change, diaphoresis, fatigue, fever and unexpected weight change.  HENT: Positive for congestion, postnasal drip, rhinorrhea, sinus pressure, sinus pain, sneezing and sore throat. Negative for dental problem, drooling, ear discharge, ear pain, facial swelling, hearing loss, hoarse voice, mouth sores, nosebleeds, tinnitus, trouble swallowing and voice change.   Eyes: Negative.  Negative for photophobia and visual disturbance.  Respiratory: Positive for cough. Negative for chest tightness and shortness of breath.   Cardiovascular:  Negative for chest pain, palpitations and leg swelling.  Gastrointestinal: Negative for abdominal pain, blood in stool, constipation, diarrhea, nausea and vomiting.  Endocrine: Negative.   Genitourinary: Negative for decreased urine volume, difficulty urinating, dysuria, frequency and urgency.  Musculoskeletal: Positive for myalgias. Negative for arthralgias, back pain, gait problem, joint swelling, neck pain and neck stiffness.  Skin: Negative.  Negative for color change and pallor.  Allergic/Immunologic: Negative.   Neurological: Positive for headaches. Negative for dizziness, tremors, seizures, syncope, facial asymmetry, speech difficulty, weakness, light-headedness and numbness.  Hematological: Negative.   Psychiatric/Behavioral: Negative for confusion, hallucinations, sleep disturbance and suicidal ideas.  All other systems reviewed and are negative.        Observations/Objective: No vital signs or physical exam, this was a telephone or virtual health encounter.  Pt alert and oriented, answers all questions appropriately, and able to speak in full sentences.    Assessment and Plan: Thaison was seen today for  sinus problem.  Diagnoses and all orders for this visit:  Acute non-recurrent frontal sinusitis Cough Reported symptoms consistent with frontal sinusitis. Pt has been treating symptomatically for over 14 days without resolution of symptoms. Due to failed symptomatic therapy and ongoing symptoms, will initiate antibiotics along with flonase, mucinex, and prednisone. Pt aware to stay adequately hydrated and to take medications as prescribed. Report any new, worsening, or persistent symptoms.  -     guaiFENesin (MUCINEX) 600 MG 12 hr tablet; Take 1 tablet (600 mg total) by mouth 2 (two) times daily for 14 days. -     fluticasone (FLONASE) 50 MCG/ACT nasal spray; Place 2 sprays into both nostrils daily. -     amoxicillin-clavulanate (AUGMENTIN) 875-125 MG tablet; Take 1 tablet by mouth 2 (two) times daily for 10 days. -     predniSONE (DELTASONE) 20 MG tablet; 2 po at sametime daily for 5 days   Follow Up Instructions: Return if symptoms worsen or fail to improve.    I discussed the assessment and treatment plan with the patient. The patient was provided an opportunity to ask questions and all were answered. The patient agreed with the plan and demonstrated an understanding of the instructions.   The patient was advised to call back or seek an in-person evaluation if the symptoms worsen or if the condition fails to improve as anticipated.  The above assessment and management plan was discussed with the patient. The patient verbalized understanding of and has agreed to the management plan. Patient is aware to call the clinic if they develop any new symptoms or if symptoms persist or worsen. Patient is aware when to return to the clinic for a follow-up visit. Patient educated on when it is appropriate to go to the emergency department.    I provided 15 minutes of non-face-to-face time during this encounter. The call started at 1310. The call ended at 1325. The other time was used for coordination  of care.    Monia Pouch, FNP-C [facility] [address] [place], [place] [address] [phone] 12/15/18

## 2018-12-16 ENCOUNTER — Other Ambulatory Visit: Payer: Self-pay | Admitting: Family Medicine

## 2019-01-09 ENCOUNTER — Other Ambulatory Visit: Payer: Self-pay

## 2019-01-10 ENCOUNTER — Ambulatory Visit: Payer: BC Managed Care – PPO | Admitting: Family Medicine

## 2019-01-10 ENCOUNTER — Encounter: Payer: Self-pay | Admitting: Family Medicine

## 2019-01-10 VITALS — BP 138/86 | HR 89 | Temp 97.3°F | Ht 70.0 in | Wt 278.4 lb

## 2019-01-10 DIAGNOSIS — I10 Essential (primary) hypertension: Secondary | ICD-10-CM | POA: Diagnosis not present

## 2019-01-10 DIAGNOSIS — E7800 Pure hypercholesterolemia, unspecified: Secondary | ICD-10-CM

## 2019-01-10 DIAGNOSIS — Z7901 Long term (current) use of anticoagulants: Secondary | ICD-10-CM | POA: Diagnosis not present

## 2019-01-10 DIAGNOSIS — E119 Type 2 diabetes mellitus without complications: Secondary | ICD-10-CM

## 2019-01-10 LAB — CBC WITH DIFFERENTIAL/PLATELET
Basophils Absolute: 0.1 10*3/uL (ref 0.0–0.2)
Basos: 1 %
EOS (ABSOLUTE): 0.3 10*3/uL (ref 0.0–0.4)
Eos: 3 %
Hematocrit: 49.9 % (ref 37.5–51.0)
Hemoglobin: 16.1 g/dL (ref 13.0–17.7)
Immature Grans (Abs): 0.1 10*3/uL (ref 0.0–0.1)
Immature Granulocytes: 1 %
Lymphocytes Absolute: 2.6 10*3/uL (ref 0.7–3.1)
Lymphs: 30 %
MCH: 29.8 pg (ref 26.6–33.0)
MCHC: 32.3 g/dL (ref 31.5–35.7)
MCV: 92 fL (ref 79–97)
Monocytes Absolute: 0.8 10*3/uL (ref 0.1–0.9)
Monocytes: 9 %
Neutrophils Absolute: 4.9 10*3/uL (ref 1.4–7.0)
Neutrophils: 56 %
Platelets: 276 10*3/uL (ref 150–450)
RBC: 5.4 x10E6/uL (ref 4.14–5.80)
RDW: 12.7 % (ref 11.6–15.4)
WBC: 8.7 10*3/uL (ref 3.4–10.8)

## 2019-01-10 LAB — CMP14+EGFR
ALT: 68 IU/L — ABNORMAL HIGH (ref 0–44)
AST: 51 IU/L — ABNORMAL HIGH (ref 0–40)
Albumin/Globulin Ratio: 1.8 (ref 1.2–2.2)
Albumin: 4.6 g/dL (ref 3.8–4.9)
Alkaline Phosphatase: 86 IU/L (ref 39–117)
BUN/Creatinine Ratio: 10 (ref 10–24)
BUN: 10 mg/dL (ref 8–27)
Bilirubin Total: 0.6 mg/dL (ref 0.0–1.2)
CO2: 25 mmol/L (ref 20–29)
Calcium: 9.9 mg/dL (ref 8.6–10.2)
Chloride: 99 mmol/L (ref 96–106)
Creatinine, Ser: 0.96 mg/dL (ref 0.76–1.27)
GFR calc Af Amer: 99 mL/min/{1.73_m2} (ref >59)
GFR calc non Af Amer: 86 mL/min/{1.73_m2} (ref >59)
Globulin, Total: 2.5 g/dL (ref 1.5–4.5)
Glucose: 223 mg/dL — ABNORMAL HIGH (ref 65–99)
Potassium: 4.8 mmol/L (ref 3.5–5.2)
Sodium: 138 mmol/L (ref 134–144)
Total Protein: 7.1 g/dL (ref 6.0–8.5)

## 2019-01-10 LAB — LIPID PANEL
Chol/HDL Ratio: 3.5 ratio (ref 0.0–5.0)
Cholesterol, Total: 127 mg/dL (ref 100–199)
HDL: 36 mg/dL — ABNORMAL LOW (ref >39)
LDL Chol Calc (NIH): 73 mg/dL (ref 0–99)
Triglycerides: 96 mg/dL (ref 0–149)
VLDL Cholesterol Cal: 18 mg/dL (ref 5–40)

## 2019-01-10 LAB — BAYER DCA HB A1C WAIVED: HB A1C (BAYER DCA - WAIVED): 8.5 % — ABNORMAL HIGH (ref <7.0)

## 2019-01-10 MED ORDER — METFORMIN HCL ER 750 MG PO TB24: 750.0000 mg | ORAL_TABLET | Freq: Two times a day (BID) | ORAL | 1 refills | Status: DC

## 2019-01-10 NOTE — Progress Notes (Signed)
Subjective:  Patient ID: Brian Roach,  male    DOB: 24-Jun-1960  Age: 58 y.o.    CC: Medical Management of Chronic Issues (3 mths ) and Diabetes   HPI Brian Roach presents for  follow-up of hypertension. Patient has no history of headache chest pain or shortness of breath or recent cough. Patient also denies symptoms of TIA such as numbness weakness lateralizing. Patient denies side effects from medication. States taking it regularly.  Patient also  in for follow-up of elevated cholesterol. Doing well without complaints on current medication. Denies side effects  including myalgia and arthralgia and nausea. Also in today for liver function testing. Currently no chest pain, shortness of breath or other cardiovascular related symptoms noted.  Follow-up of diabetes. Patient does check blood sugar at home. Readings run between 170-190 and 200+ Patient denies symptoms such as excessive hunger or urinary frequency, excessive hunger, nausea No significant hypoglycemic spells noted. Medications reviewed. Pt reports taking them regularly. Pt. denies complication/adverse reaction today.    History Brian Roach has a past medical history of Diabetes mellitus without complication ([place]), Hyperlipidemia, Hypertension, and PE (pulmonary embolism).   He has a past surgical history that includes Back surgery; Knee surgery; Lung surgery; Anterior cruciate ligament repair (Right); Wisdom tooth extraction; and Quadriceps tendon repair (Left, 09/23/2016).   His family history includes Heart disease in his brother and father.He reports that he has never smoked. He has never used smokeless tobacco. He reports that he does not drink alcohol or use drugs.  Current Outpatient Medications on File Prior to Visit  Medication Sig Dispense Refill  . apixaban (ELIQUIS) 5 MG TABS tablet Take 1 tablet (5 mg total) by mouth 2 (two) times daily. 60 tablet 5  . atorvastatin (LIPITOR) 20 MG tablet Take 1 tablet (20 mg  total) by mouth every evening. 90 tablet 1  . benazepril (LOTENSIN) 20 MG tablet Take 1 tablet (20 mg total) by mouth daily. 90 tablet 1  . cetirizine (ZYRTEC) 10 MG tablet TAKE 1 TABLET (10 MG TOTAL) BY MOUTH DAILY. FOR ALLERGY SYMPTOMS 90 tablet 3  . cyclobenzaprine (FLEXERIL) 10 MG tablet TAKE 1 TABLET BY MOUTH THREE TIMES A DAY AS NEEDED FOR MUSCLE SPASMS 90 tablet 5  . diltiazem (CARDIZEM CD) 360 MG 24 hr capsule Take 1 capsule (360 mg total) by mouth daily. 90 capsule 1  . fluticasone (FLONASE) 50 MCG/ACT nasal spray Place 2 sprays into both nostrils daily. 16 g 6  . Multiple Vitamins-Minerals (ONE-A-DAY MENS HEALTH FORMULA PO) Take 1 tablet by mouth daily.    Marland Kitchen omeprazole (PRILOSEC) 20 MG capsule Take 1 capsule (20 mg total) by mouth daily. 90 capsule 1  . trazodone (DESYREL) 300 MG tablet TAKE 1 TABLET BY MOUTH EVERYDAY AT BEDTIME 90 tablet 1  . linaclotide (LINZESS) 290 MCG CAPS capsule TAKE 1 CAPSULE (290 MCG TOTAL) BY MOUTH DAILY. TO REGULATE BOWEL MOVEMENTS (Patient not taking: Reported on 01/10/2019) 90 capsule 0   No current facility-administered medications on file prior to visit.     ROS Review of Systems  Constitutional: Negative.   HENT: Negative.   Eyes: Negative for visual disturbance.  Respiratory: Negative for cough and shortness of breath.   Cardiovascular: Negative for chest pain and leg swelling.  Gastrointestinal: Negative for abdominal pain, diarrhea, nausea and vomiting.  Genitourinary: Negative for difficulty urinating.  Musculoskeletal: Negative for arthralgias and myalgias.  Skin: Negative for rash.  Neurological: Negative for headaches.  Psychiatric/Behavioral: Negative for sleep  disturbance.    Objective:  BP 138/86   Pulse 89   Temp (!) 97.3 F (36.3 C) (Temporal)   Ht 5\' 10"  (1.778 m)   Wt 278 lb 6.4 oz (126.3 kg)   SpO2 93%   BMI 39.95 kg/m   BP Readings from Last 3 Encounters:  01/10/19 138/86  04/27/18 125/75  04/19/18 120/85    Wt  Readings from Last 3 Encounters:  01/10/19 278 lb 6.4 oz (126.3 kg)  04/27/18 276 lb 12.8 oz (125.6 kg)  04/19/18 278 lb (126.1 kg)     Physical Exam Constitutional:      General: He is not in acute distress.    Appearance: He is well-developed.  HENT:     Head: Normocephalic and atraumatic.     Right Ear: External ear normal.     Left Ear: External ear normal.     Nose: Nose normal.  Eyes:     Conjunctiva/sclera: Conjunctivae normal.     Pupils: Pupils are equal, round, and reactive to light.  Neck:     Musculoskeletal: Normal range of motion and neck supple.  Cardiovascular:     Rate and Rhythm: Normal rate and regular rhythm.     Heart sounds: Normal heart sounds. No murmur.  Pulmonary:     Effort: Pulmonary effort is normal. No respiratory distress.     Breath sounds: Normal breath sounds. No wheezing or rales.  Abdominal:     Palpations: Abdomen is soft.     Tenderness: There is no abdominal tenderness.  Musculoskeletal: Normal range of motion.  Skin:    General: Skin is warm and dry.  Neurological:     Mental Status: He is alert and oriented to person, place, and time.     Deep Tendon Reflexes: Reflexes are normal and symmetric.  Psychiatric:        Behavior: Behavior normal.        Thought Content: Thought content normal.        Judgment: Judgment normal.     Diabetic Foot Exam - Simple   No data filed        Assessment & Plan:   Brian Roach was seen today for medical management of chronic issues and diabetes.  Diagnoses and all orders for this visit:  Diabetes mellitus without complication (HCC) -     hgba1c -     Microalbumin / creatinine urine ratio -     CBC -     CMP -     Lipid  Long term current use of anticoagulant -     CBC  HTN (hypertension), benign -     CMP  Hypercholesteremia -     CMP -     Lipid  Other orders -     metFORMIN (GLUCOPHAGE-XR) 750 MG 24 hr tablet; Take 1 tablet (750 mg total) by mouth 2 (two) times daily.    I have discontinued Brian Roach. Brian Roach docusate sodium and predniSONE. I have also changed his metFORMIN. Additionally, I am having him maintain his Multiple Vitamins-Minerals (ONE-A-DAY MENS HEALTH FORMULA PO), cyclobenzaprine, apixaban, atorvastatin, diltiazem, benazepril, linaclotide, omeprazole, trazodone, fluticasone, and cetirizine.  Meds ordered this encounter  Medications  . metFORMIN (GLUCOPHAGE-XR) 750 MG 24 hr tablet    Sig: Take 1 tablet (750 mg total) by mouth 2 (two) times daily.    Dispense:  180 tablet    Refill:  1     Follow-up: Return in about 3 months (around 04/12/2019).  06/10/2019, M.D.

## 2019-01-11 LAB — MICROALBUMIN / CREATININE URINE RATIO
Creatinine, Urine: 149 mg/dL
Microalb/Creat Ratio: 181 mg/g creat — ABNORMAL HIGH (ref 0–29)
Microalbumin, Urine: 269.7 ug/mL

## 2019-02-03 ENCOUNTER — Other Ambulatory Visit: Payer: Self-pay | Admitting: Family Medicine

## 2019-03-07 ENCOUNTER — Other Ambulatory Visit: Payer: Self-pay

## 2019-03-07 ENCOUNTER — Ambulatory Visit (INDEPENDENT_AMBULATORY_CARE_PROVIDER_SITE_OTHER): Payer: BC Managed Care – PPO | Admitting: Family Medicine

## 2019-03-07 ENCOUNTER — Encounter: Payer: Self-pay | Admitting: Family Medicine

## 2019-03-07 DIAGNOSIS — R3989 Other symptoms and signs involving the genitourinary system: Secondary | ICD-10-CM | POA: Diagnosis not present

## 2019-03-07 MED ORDER — CEPHALEXIN 500 MG PO CAPS: 500.0000 mg | ORAL_CAPSULE | Freq: Three times a day (TID) | ORAL | 0 refills | Status: AC

## 2019-03-07 NOTE — Progress Notes (Signed)
Telephone visit  Subjective: CC: UTI PCP: Claretta Fraise, MD Brian Roach is a 59 y.o. male calls for telephone consult today. Patient provides verbal consent for consult held via phone.  Due to COVID-19 pandemic this visit was conducted virtually. This visit type was conducted due to national recommendations for restrictions regarding the COVID-19 Pandemic (e.g. social distancing, sheltering in place) in an effort to limit this patient's exposure and mitigate transmission in our community. All issues noted in this document were discussed and addressed.  A physical exam was not performed with this format.   Location of patient: home Location of provider: [facility] Others present for call: none  1. Urinary symptoms Patient reports a 1 day h/o urinary urgency.  This morning he developed urinary incontinence, frequency and dysuria. He had scant hematuria earlier that has resolved.  Denies fevers, chills, abdominal pain, nausea, vomiting, back pain.  No history of renal/ bladder stones.  Patient has used nothing for symptoms.  Patient denies a h/o frequent or recurrent UTIs.     ROS: Per HPI  No Known Allergies Past Medical History:  Diagnosis Date  . Diabetes mellitus without complication ([place])   . Hyperlipidemia   . Hypertension   . PE (pulmonary embolism)     Current Outpatient Medications:  .  apixaban (ELIQUIS) 5 MG TABS tablet, Take 1 tablet (5 mg total) by mouth 2 (two) times daily., Disp: 60 tablet, Rfl: 5 .  atorvastatin (LIPITOR) 20 MG tablet, Take 1 tablet (20 mg total) by mouth every evening., Disp: 90 tablet, Rfl: 1 .  benazepril (LOTENSIN) 20 MG tablet, Take 1 tablet (20 mg total) by mouth daily., Disp: 90 tablet, Rfl: 1 .  cetirizine (ZYRTEC) 10 MG tablet, TAKE 1 TABLET (10 MG TOTAL) BY MOUTH DAILY. FOR ALLERGY SYMPTOMS, Disp: 90 tablet, Rfl: 3 .  cyclobenzaprine (FLEXERIL) 10 MG tablet, TAKE 1 TABLET BY MOUTH THREE TIMES A DAY AS NEEDED FOR MUSCLE SPASMS, Disp: 90  tablet, Rfl: 5 .  diltiazem (CARDIZEM CD) 360 MG 24 hr capsule, Take 1 capsule (360 mg total) by mouth daily., Disp: 90 capsule, Rfl: 1 .  fluticasone (FLONASE) 50 MCG/ACT nasal spray, Place 2 sprays into both nostrils daily., Disp: 16 g, Rfl: 6 .  linaclotide (LINZESS) 290 MCG CAPS capsule, TAKE 1 CAPSULE (290 MCG TOTAL) BY MOUTH DAILY. TO REGULATE BOWEL MOVEMENTS (Patient not taking: Reported on 01/10/2019), Disp: 90 capsule, Rfl: 0 .  metFORMIN (GLUCOPHAGE-XR) 750 MG 24 hr tablet, Take 1 tablet (750 mg total) by mouth 2 (two) times daily., Disp: 180 tablet, Rfl: 1 .  Multiple Vitamins-Minerals (ONE-A-DAY MENS HEALTH FORMULA PO), Take 1 tablet by mouth daily., Disp: , Rfl:  .  omeprazole (PRILOSEC) 20 MG capsule, Take 1 capsule (20 mg total) by mouth daily., Disp: 90 capsule, Rfl: 1 .  trazodone (DESYREL) 300 MG tablet, TAKE 1 TABLET BY MOUTH EVERYDAY AT BEDTIME, Disp: 90 tablet, Rfl: 1  Assessment/ Plan: 59 y.o. male   1. Suspected UTI Very suspicious for urinary tract infection.  Renal function reviewed.  No adjustment needed for antibiotic.  Empiric treatment with Keflex 3 times daily sent to pharmacy.  We discussed red flag signs and symptoms warranting further evaluation.  If no significant improvement by Friday have asked that he get seen in office for urine sample and possibly change of antibiotics.  He was good understanding of plan.  He will increase frequency of water.  Meds ordered this encounter  Medications  . cephALEXin (KEFLEX) 500  MG capsule    Sig: Take 1 capsule (500 mg total) by mouth 3 (three) times daily for 7 days.    Dispense:  21 capsule    Refill:  0    Start time: 12:30pm End time: 12:35  Total time spent on patient care (including telephone call/ virtual visit): 15 minutes  Brian Roach Hulen Skains, DO [facility] [phone]

## 2019-03-07 NOTE — Patient Instructions (Signed)
Urinary Tract Infection, Adult A urinary tract infection (UTI) is an infection of any part of the urinary tract. The urinary tract includes:  The kidneys.  The ureters.  The bladder.  The urethra. These organs make, store, and get rid of pee (urine) in the body. What are the causes? This is caused by germs (bacteria) in your genital area. These germs grow and cause swelling (inflammation) of your urinary tract. What increases the risk? You are more likely to develop this condition if:  You have a small, thin tube (catheter) to drain pee.  You cannot control when you pee or poop (incontinence).  You are male, and: ? You use these methods to prevent pregnancy:  A medicine that kills sperm (spermicide).  A device that blocks sperm (diaphragm). ? You have low levels of a male hormone (estrogen). ? You are pregnant.  You have genes that add to your risk.  You are sexually active.  You take antibiotic medicines.  You have trouble peeing because of: ? A prostate that is bigger than normal, if you are male. ? A blockage in the part of your body that drains pee from the bladder (urethra). ? A kidney stone. ? A nerve condition that affects your bladder (neurogenic bladder). ? Not getting enough to drink. ? Not peeing often enough.  You have other conditions, such as: ? Diabetes. ? A weak disease-fighting system (immune system). ? Sickle cell disease. ? Gout. ? Injury of the spine. What are the signs or symptoms? Symptoms of this condition include:  Needing to pee right away (urgently).  Peeing often.  Peeing small amounts often.  Pain or burning when peeing.  Blood in the pee.  Pee that smells bad or not like normal.  Trouble peeing.  Pee that is cloudy.  Fluid coming from the vagina, if you are male.  Pain in the belly or lower back. Other symptoms include:  Throwing up (vomiting).  No urge to eat.  Feeling mixed up (confused).  Being tired  and grouchy (irritable).  A fever.  Watery poop (diarrhea). How is this treated? This condition may be treated with:  Antibiotic medicine.  Other medicines.  Drinking enough water. Follow these instructions at home:  Medicines  Take over-the-counter and prescription medicines only as told by your doctor.  If you were prescribed an antibiotic medicine, take it as told by your doctor. Do not stop taking it even if you start to feel better. General instructions  Make sure you: ? Pee until your bladder is empty. ? Do not hold pee for a long time. ? Empty your bladder after sex. ? Wipe from front to back after pooping if you are a male. Use each tissue one time when you wipe.  Drink enough fluid to keep your pee pale yellow.  Keep all follow-up visits as told by your doctor. This is important. Contact a doctor if:  You do not get better after 1-2 days.  Your symptoms go away and then come back. Get help right away if:  You have very bad back pain.  You have very bad pain in your lower belly.  You have a fever.  You are sick to your stomach (nauseous).  You are throwing up. Summary  A urinary tract infection (UTI) is an infection of any part of the urinary tract.  This condition is caused by germs in your genital area.  There are many risk factors for a UTI. These include having a small, thin   tube to drain pee and not being able to control when you pee or poop.  Treatment includes antibiotic medicines for germs.  Drink enough fluid to keep your pee pale yellow. This information is not intended to replace advice given to you by your health care provider. Make sure you discuss any questions you have with your health care provider. Document Revised: 02/02/2018 Document Reviewed: 08/25/2017 Elsevier Patient Education  2020 [employer]  

## 2019-03-14 ENCOUNTER — Other Ambulatory Visit: Payer: Self-pay | Admitting: Family Medicine

## 2019-03-14 ENCOUNTER — Other Ambulatory Visit: Payer: BC Managed Care – PPO

## 2019-03-14 ENCOUNTER — Other Ambulatory Visit: Payer: Self-pay

## 2019-03-14 ENCOUNTER — Telehealth: Payer: Self-pay | Admitting: Family Medicine

## 2019-03-14 DIAGNOSIS — R3989 Other symptoms and signs involving the genitourinary system: Secondary | ICD-10-CM

## 2019-03-14 LAB — MICROSCOPIC EXAMINATION
Bacteria, UA: NONE SEEN
Epithelial Cells (non renal): NONE SEEN /hpf (ref 0–10)
Renal Epithel, UA: NONE SEEN /hpf
WBC, UA: NONE SEEN /hpf (ref 0–5)

## 2019-03-14 LAB — URINALYSIS, COMPLETE
Bilirubin, UA: NEGATIVE
Ketones, UA: NEGATIVE
Leukocytes,UA: NEGATIVE
Nitrite, UA: NEGATIVE
Protein,UA: NEGATIVE
Specific Gravity, UA: 1.015 (ref 1.005–1.030)
Urobilinogen, Ur: 1 mg/dL (ref 0.2–1.0)
pH, UA: 6 (ref 5.0–7.5)

## 2019-03-14 NOTE — Telephone Encounter (Signed)
Pt called and aware

## 2019-03-14 NOTE — Telephone Encounter (Signed)
Any time is fine. I entered the orders

## 2019-03-16 LAB — URINE CULTURE: Organism ID, Bacteria: NO GROWTH

## 2019-03-25 ENCOUNTER — Other Ambulatory Visit: Payer: Self-pay | Admitting: Family Medicine

## 2019-04-05 ENCOUNTER — Other Ambulatory Visit: Payer: Self-pay | Admitting: Family Medicine

## 2019-04-06 ENCOUNTER — Other Ambulatory Visit: Payer: Self-pay | Admitting: Family Medicine

## 2019-04-07 ENCOUNTER — Other Ambulatory Visit: Payer: Self-pay | Admitting: Family Medicine

## 2019-04-12 ENCOUNTER — Ambulatory Visit: Payer: BC Managed Care – PPO | Admitting: Family Medicine

## 2019-04-16 ENCOUNTER — Telehealth: Payer: Self-pay | Admitting: Family Medicine

## 2019-04-17 ENCOUNTER — Encounter: Payer: Self-pay | Admitting: Family Medicine

## 2019-04-17 ENCOUNTER — Other Ambulatory Visit: Payer: Self-pay

## 2019-04-17 ENCOUNTER — Ambulatory Visit: Payer: BC Managed Care – PPO | Admitting: Family Medicine

## 2019-04-17 VITALS — BP 137/82 | HR 85 | Temp 98.7°F | Ht 70.0 in | Wt 277.8 lb

## 2019-04-17 DIAGNOSIS — Z7901 Long term (current) use of anticoagulants: Secondary | ICD-10-CM

## 2019-04-17 DIAGNOSIS — J0110 Acute frontal sinusitis, unspecified: Secondary | ICD-10-CM

## 2019-04-17 DIAGNOSIS — E7800 Pure hypercholesterolemia, unspecified: Secondary | ICD-10-CM

## 2019-04-17 DIAGNOSIS — K580 Irritable bowel syndrome with diarrhea: Secondary | ICD-10-CM

## 2019-04-17 DIAGNOSIS — E119 Type 2 diabetes mellitus without complications: Secondary | ICD-10-CM

## 2019-04-17 DIAGNOSIS — K219 Gastro-esophageal reflux disease without esophagitis: Secondary | ICD-10-CM

## 2019-04-17 DIAGNOSIS — G4701 Insomnia due to medical condition: Secondary | ICD-10-CM

## 2019-04-17 DIAGNOSIS — I10 Essential (primary) hypertension: Secondary | ICD-10-CM

## 2019-04-17 LAB — BAYER DCA HB A1C WAIVED: HB A1C (BAYER DCA - WAIVED): 8.4 % — ABNORMAL HIGH (ref <7.0)

## 2019-04-17 MED ORDER — FEXOFENADINE-PSEUDOEPHED ER 180-240 MG PO TB24: 1.0000 | ORAL_TABLET | Freq: Every evening | ORAL | 11 refills | Status: DC

## 2019-04-17 MED ORDER — METFORMIN HCL ER 750 MG PO TB24: ORAL_TABLET | ORAL | 1 refills | Status: DC

## 2019-04-17 MED ORDER — BENAZEPRIL HCL 20 MG PO TABS: 20.0000 mg | ORAL_TABLET | Freq: Every day | ORAL | 1 refills | Status: DC

## 2019-04-17 MED ORDER — OMEPRAZOLE 20 MG PO CPDR: 20.0000 mg | DELAYED_RELEASE_CAPSULE | Freq: Every day | ORAL | 1 refills | Status: DC

## 2019-04-17 MED ORDER — LINACLOTIDE 290 MCG PO CAPS: ORAL_CAPSULE | ORAL | 1 refills | Status: DC

## 2019-04-17 MED ORDER — APIXABAN 5 MG PO TABS: 5.0000 mg | ORAL_TABLET | Freq: Two times a day (BID) | ORAL | 5 refills | Status: DC

## 2019-04-17 MED ORDER — SITAGLIPTIN PHOSPHATE 100 MG PO TABS: 100.0000 mg | ORAL_TABLET | Freq: Every day | ORAL | 2 refills | Status: DC

## 2019-04-17 MED ORDER — DILTIAZEM HCL ER COATED BEADS 360 MG PO CP24: ORAL_CAPSULE | ORAL | 1 refills | Status: DC

## 2019-04-17 MED ORDER — AMOXICILLIN-POT CLAVULANATE 875-125 MG PO TABS: 1.0000 | ORAL_TABLET | Freq: Two times a day (BID) | ORAL | 0 refills | Status: DC

## 2019-04-17 MED ORDER — TRAZODONE HCL 300 MG PO TABS: ORAL_TABLET | ORAL | 1 refills | Status: DC

## 2019-04-17 NOTE — Progress Notes (Signed)
Subjective:  Patient ID: Brian Roach,  male    DOB: 08-Jun-1960  Age: 59 y.o.    CC: Follow-up   HPI Brian Roach presents for  follow-up of hypertension. Patient has no history of headache chest pain or shortness of breath or recent cough. Patient also denies symptoms of TIA such as numbness weakness lateralizing. Patient denies side effects from medication. States taking it regularly.  Patient also  in for follow-up of elevated cholesterol. Doing well without complaints on current medication. Denies side effects  including myalgia and arthralgia and nausea. Also in today for liver function testing. Currently no chest pain, shortness of breath or other cardiovascular related symptoms noted.  Follow-up of diabetes. Patient does check blood sugar at home. Readings run between 120-180 fasting and 160-230 Patient denies symptoms such as excessive hunger or urinary frequency, excessive hunger, nausea No significant hypoglycemic spells noted. Medications reviewed. Pt reports taking them regularly. Pt. denies complication/adverse reaction today.    History Brian Roach has a past medical history of Diabetes mellitus without complication ([place]), Hyperlipidemia, Hypertension, and PE (pulmonary embolism).   He has a past surgical history that includes Back surgery; Knee surgery; Lung surgery; Anterior cruciate ligament repair (Right); Wisdom tooth extraction; and Quadriceps tendon repair (Left, 09/23/2016).   His family history includes Heart disease in his brother and father.He reports that he has never smoked. He has never used smokeless tobacco. He reports that he does not drink alcohol or use drugs.  Current Outpatient Medications on File Prior to Visit  Medication Sig Dispense Refill  . atorvastatin (LIPITOR) 20 MG tablet TAKE 1 TABLET BY MOUTH EVERY DAY IN THE EVENING 90 tablet 1  . cetirizine (ZYRTEC) 10 MG tablet TAKE 1 TABLET (10 MG TOTAL) BY MOUTH DAILY. FOR ALLERGY SYMPTOMS 90  tablet 3  . cyclobenzaprine (FLEXERIL) 10 MG tablet TAKE 1 TABLET BY MOUTH THREE TIMES A DAY AS NEEDED FOR MUSCLE SPASMS 90 tablet 5  . fluticasone (FLONASE) 50 MCG/ACT nasal spray Place 2 sprays into both nostrils daily. 16 g 6  . Multiple Vitamins-Minerals (ONE-A-DAY MENS HEALTH FORMULA PO) Take 1 tablet by mouth daily.     No current facility-administered medications on file prior to visit.    ROS Review of Systems  Constitutional: Negative.   HENT: Negative.   Eyes: Negative for visual disturbance.  Respiratory: Negative for cough and shortness of breath.   Cardiovascular: Negative for chest pain and leg swelling.  Gastrointestinal: Negative for abdominal pain, diarrhea, nausea and vomiting.  Genitourinary: Negative for difficulty urinating.  Musculoskeletal: Negative for arthralgias and myalgias.  Skin: Negative for rash.  Neurological: Negative for headaches.  Psychiatric/Behavioral: Negative for sleep disturbance.    Objective:  BP 137/82   Pulse 85   Temp 98.7 F (37.1 C) (Temporal)   Ht '5\' 10"'$  (1.778 m)   Wt 277 lb 12.8 oz (126 kg)   BMI 39.86 kg/m   BP Readings from Last 3 Encounters:  04/17/19 137/82  01/10/19 138/86  04/27/18 125/75    Wt Readings from Last 3 Encounters:  04/17/19 277 lb 12.8 oz (126 kg)  01/10/19 278 lb 6.4 oz (126.3 kg)  04/27/18 276 lb 12.8 oz (125.6 kg)     Physical Exam Vitals reviewed.  Constitutional:      Appearance: He is well-developed.  HENT:     Head: Normocephalic and atraumatic.     Right Ear: Tympanic membrane and external ear normal. No decreased hearing noted.  Left Ear: Tympanic membrane and external ear normal. No decreased hearing noted.     Mouth/Throat:     Pharynx: No oropharyngeal exudate or posterior oropharyngeal erythema.  Eyes:     Pupils: Pupils are equal, round, and reactive to light.  Cardiovascular:     Rate and Rhythm: Normal rate and regular rhythm.     Heart sounds: No murmur.  Pulmonary:       Effort: No respiratory distress.     Breath sounds: Normal breath sounds.  Abdominal:     General: Bowel sounds are normal.     Palpations: Abdomen is soft. There is no mass.     Tenderness: There is no abdominal tenderness.  Musculoskeletal:     Cervical back: Normal range of motion and neck supple.     Diabetic Foot Exam - Simple   No data filed        Assessment & Plan:   Brian Roach was seen today for follow-up.  Diagnoses and all orders for this visit:  Diabetes mellitus without complication ([place]) -     CBC with Differential/Platelet -     CMP14+EGFR -     Bayer DCA Hb A1c Waived -     metFORMIN (GLUCOPHAGE-XR) 750 MG 24 hr tablet; TAKE 1 TABLET BY MOUTH With Breakfast and supper -     sitaGLIPtin (JANUVIA) 100 MG tablet; Take 1 tablet (100 mg total) by mouth daily.  HTN (hypertension), benign -     CBC with Differential/Platelet -     CMP14+EGFR -     Bayer DCA Hb A1c Waived -     diltiazem (CARDIZEM CD) 360 MG 24 hr capsule; TAKE 1 CAPSULE BY MOUTH EVERY DAY -     benazepril (LOTENSIN) 20 MG tablet; Take 1 tablet (20 mg total) by mouth daily.  Hypercholesteremia -     CBC with Differential/Platelet -     CMP14+EGFR -     Bayer DCA Hb A1c Waived  Long term current use of anticoagulant -     CBC with Differential/Platelet -     CMP14+EGFR -     Bayer DCA Hb A1c Waived -     apixaban (ELIQUIS) 5 MG TABS tablet; Take 1 tablet (5 mg total) by mouth 2 (two) times daily.  Acute non-recurrent frontal sinusitis -     amoxicillin-clavulanate (AUGMENTIN) 875-125 MG tablet; Take 1 tablet by mouth 2 (two) times daily. Take all of this medication -     fexofenadine-pseudoephedrine (ALLEGRA-D 24) 180-240 MG 24 hr tablet; Take 1 tablet by mouth every evening. For allergy and congestion  Irritable bowel syndrome with diarrhea -     linaclotide (LINZESS) 290 MCG CAPS capsule; TAKE 1 CAPSULE (290 MCG TOTAL) BY MOUTH DAILY. TO REGULATE BOWEL MOVEMENTS  Insomnia due to  medical condition -     trazodone (DESYREL) 300 MG tablet; TAKE 1 TABLET BY MOUTH EVERYDAY AT BEDTIME  Gastroesophageal reflux disease without esophagitis -     omeprazole (PRILOSEC) 20 MG capsule; Take 1 capsule (20 mg total) by mouth daily.   I have changed Brian Roach's Linzess to linaclotide. I have also changed his metFORMIN. I am also having him start on sitaGLIPtin, amoxicillin-clavulanate, and fexofenadine-pseudoephedrine. Additionally, I am having him maintain his Multiple Vitamins-Minerals (ONE-A-DAY MENS HEALTH FORMULA PO), fluticasone, cetirizine, cyclobenzaprine, atorvastatin, trazodone, omeprazole, diltiazem, benazepril, and apixaban.  Meds ordered this encounter  Medications  . trazodone (DESYREL) 300 MG tablet    Sig: TAKE 1 TABLET BY MOUTH  EVERYDAY AT BEDTIME    Dispense:  90 tablet    Refill:  1  . omeprazole (PRILOSEC) 20 MG capsule    Sig: Take 1 capsule (20 mg total) by mouth daily.    Dispense:  90 capsule    Refill:  1  . metFORMIN (GLUCOPHAGE-XR) 750 MG 24 hr tablet    Sig: TAKE 1 TABLET BY MOUTH With Breakfast and supper    Dispense:  180 tablet    Refill:  1  . linaclotide (LINZESS) 290 MCG CAPS capsule    Sig: TAKE 1 CAPSULE (290 MCG TOTAL) BY MOUTH DAILY. TO REGULATE BOWEL MOVEMENTS    Dispense:  90 capsule    Refill:  1  . diltiazem (CARDIZEM CD) 360 MG 24 hr capsule    Sig: TAKE 1 CAPSULE BY MOUTH EVERY DAY    Dispense:  90 capsule    Refill:  1  . benazepril (LOTENSIN) 20 MG tablet    Sig: Take 1 tablet (20 mg total) by mouth daily.    Dispense:  90 tablet    Refill:  1  . apixaban (ELIQUIS) 5 MG TABS tablet    Sig: Take 1 tablet (5 mg total) by mouth 2 (two) times daily.    Dispense:  60 tablet    Refill:  5  . sitaGLIPtin (JANUVIA) 100 MG tablet    Sig: Take 1 tablet (100 mg total) by mouth daily.    Dispense:  30 tablet    Refill:  2  . amoxicillin-clavulanate (AUGMENTIN) 875-125 MG tablet    Sig: Take 1 tablet by mouth 2 (two) times  daily. Take all of this medication    Dispense:  20 tablet    Refill:  0  . fexofenadine-pseudoephedrine (ALLEGRA-D 24) 180-240 MG 24 hr tablet    Sig: Take 1 tablet by mouth every evening. For allergy and congestion    Dispense:  30 tablet    Refill:  11     Follow-up: Return in about 3 months (around 07/15/2019).  Claretta Fraise, M.D.

## 2019-04-18 LAB — CMP14+EGFR
ALT: 53 IU/L — ABNORMAL HIGH (ref 0–44)
AST: 34 IU/L (ref 0–40)
Albumin/Globulin Ratio: 1.6 (ref 1.2–2.2)
Albumin: 4.2 g/dL (ref 3.8–4.9)
Alkaline Phosphatase: 73 IU/L (ref 39–117)
BUN/Creatinine Ratio: 21 (ref 10–24)
BUN: 17 mg/dL (ref 8–27)
Bilirubin Total: 0.3 mg/dL (ref 0.0–1.2)
CO2: 21 mmol/L (ref 20–29)
Calcium: 9.4 mg/dL (ref 8.6–10.2)
Chloride: 101 mmol/L (ref 96–106)
Creatinine, Ser: 0.8 mg/dL (ref 0.76–1.27)
GFR calc Af Amer: 112 mL/min/{1.73_m2} (ref >59)
GFR calc non Af Amer: 97 mL/min/{1.73_m2} (ref >59)
Globulin, Total: 2.7 g/dL (ref 1.5–4.5)
Glucose: 146 mg/dL — ABNORMAL HIGH (ref 65–99)
Potassium: 4.3 mmol/L (ref 3.5–5.2)
Sodium: 139 mmol/L (ref 134–144)
Total Protein: 6.9 g/dL (ref 6.0–8.5)

## 2019-04-18 LAB — CBC WITH DIFFERENTIAL/PLATELET
Basophils Absolute: 0.1 10*3/uL (ref 0.0–0.2)
Basos: 1 %
EOS (ABSOLUTE): 0.4 10*3/uL (ref 0.0–0.4)
Eos: 3 %
Hematocrit: 45 % (ref 37.5–51.0)
Hemoglobin: 14.8 g/dL (ref 13.0–17.7)
Immature Grans (Abs): 0.1 10*3/uL (ref 0.0–0.1)
Immature Granulocytes: 1 %
Lymphocytes Absolute: 3.3 10*3/uL — ABNORMAL HIGH (ref 0.7–3.1)
Lymphs: 27 %
MCH: 30.2 pg (ref 26.6–33.0)
MCHC: 32.9 g/dL (ref 31.5–35.7)
MCV: 92 fL (ref 79–97)
Monocytes Absolute: 0.9 10*3/uL (ref 0.1–0.9)
Monocytes: 7 %
Neutrophils Absolute: 7.2 10*3/uL — ABNORMAL HIGH (ref 1.4–7.0)
Neutrophils: 61 %
Platelets: 250 10*3/uL (ref 150–450)
RBC: 4.9 x10E6/uL (ref 4.14–5.80)
RDW: 12.7 % (ref 11.6–15.4)
WBC: 11.9 10*3/uL — ABNORMAL HIGH (ref 3.4–10.8)

## 2019-05-01 ENCOUNTER — Ambulatory Visit (INDEPENDENT_AMBULATORY_CARE_PROVIDER_SITE_OTHER): Payer: BC Managed Care – PPO | Admitting: Family Medicine

## 2019-05-01 ENCOUNTER — Encounter: Payer: Self-pay | Admitting: Family Medicine

## 2019-05-01 DIAGNOSIS — M545 Low back pain, unspecified: Secondary | ICD-10-CM

## 2019-05-01 MED ORDER — PREDNISONE 10 MG PO TABS: ORAL_TABLET | ORAL | 0 refills | Status: DC

## 2019-05-01 NOTE — Patient Instructions (Addendum)
Back Exercises The following exercises strengthen the muscles that help to support the trunk and back. They also help to keep the lower back flexible. Doing these exercises can help to prevent back pain or lessen existing pain.  If you have back pain or discomfort, try doing these exercises 2-3 times each day or as told by your health care provider.  As your pain improves, do them once each day, but increase the number of times that you repeat the steps for each exercise (do more repetitions).  To prevent the recurrence of back pain, continue to do these exercises once each day or as told by your health care provider. Do exercises exactly as told by your health care provider and adjust them as directed. It is normal to feel mild stretching, pulling, tightness, or discomfort as you do these exercises, but you should stop right away if you feel sudden pain or your pain gets worse. Exercises Single knee to chest Repeat these steps 3-5 times for each leg: 1. Lie on your back on a firm bed or the floor with your legs extended. 2. Bring one knee to your chest. Your other leg should stay extended and in contact with the floor. 3. Hold your knee in place by grabbing your knee or thigh with both hands and hold. 4. Pull on your knee until you feel a gentle stretch in your lower back or buttocks. 5. Hold the stretch for 10-30 seconds. 6. Slowly release and straighten your leg. Pelvic tilt Repeat these steps 5-10 times: 1. Lie on your back on a firm bed or the floor with your legs extended. 2. Bend your knees so they are pointing toward the ceiling and your feet are flat on the floor. 3. Tighten your lower abdominal muscles to press your lower back against the floor. This motion will tilt your pelvis so your tailbone points up toward the ceiling instead of pointing to your feet or the floor. 4. With gentle tension and even breathing, hold this position for 5-10 seconds. Cat-cow Repeat these steps until  your lower back becomes more flexible: 1. Get into a hands-and-knees position on a firm surface. Keep your hands under your shoulders, and keep your knees under your hips. You may place padding under your knees for comfort. 2. Let your head hang down toward your chest. Contract your abdominal muscles and point your tailbone toward the floor so your lower back becomes rounded like the back of a cat. 3. Hold this position for 5 seconds. 4. Slowly lift your head, let your abdominal muscles relax and point your tailbone up toward the ceiling so your back forms a sagging arch like the back of a cow. 5. Hold this position for 5 seconds.  Press-ups Repeat these steps 5-10 times: 1. Lie on your abdomen (face-down) on the floor. 2. Place your palms near your head, about shoulder-width apart. 3. Keeping your back as relaxed as possible and keeping your hips on the floor, slowly straighten your arms to raise the top half of your body and lift your shoulders. Do not use your back muscles to raise your upper torso. You may adjust the placement of your hands to make yourself more comfortable. 4. Hold this position for 5 seconds while you keep your back relaxed. 5. Slowly return to lying flat on the floor.  Bridges Repeat these steps 10 times: 1. Lie on your back on a firm surface. 2. Bend your knees so they are pointing toward the ceiling and   your feet are flat on the floor. Your arms should be flat at your sides, next to your body. 3. Tighten your buttocks muscles and lift your buttocks off the floor until your waist is at almost the same height as your knees. You should feel the muscles working in your buttocks and the back of your thighs. If you do not feel these muscles, slide your feet 1-2 inches farther away from your buttocks. 4. Hold this position for 3-5 seconds. 5. Slowly lower your hips to the starting position, and allow your buttocks muscles to relax completely. If this exercise is too easy, try  doing it with your arms crossed over your chest. Abdominal crunches Repeat these steps 5-10 times: 1. Lie on your back on a firm bed or the floor with your legs extended. 2. Bend your knees so they are pointing toward the ceiling and your feet are flat on the floor. 3. Cross your arms over your chest. 4. Tip your chin slightly toward your chest without bending your neck. 5. Tighten your abdominal muscles and slowly raise your trunk (torso) high enough to lift your shoulder blades a tiny bit off the floor. Avoid raising your torso higher than that because it can put too much stress on your low back and does not help to strengthen your abdominal muscles. 6. Slowly return to your starting position. Back lifts Repeat these steps 5-10 times: 1. Lie on your abdomen (face-down) with your arms at your sides, and rest your forehead on the floor. 2. Tighten the muscles in your legs and your buttocks. 3. Slowly lift your chest off the floor while you keep your hips pressed to the floor. Keep the back of your head in line with the curve in your back. Your eyes should be looking at the floor. 4. Hold this position for 3-5 seconds. 5. Slowly return to your starting position. Contact a health care provider if:  Your back pain or discomfort gets much worse when you do an exercise.  Your worsening back pain or discomfort does not lessen within 2 hours after you exercise. If you have any of these problems, stop doing these exercises right away. Do not do them again unless your health care provider says that you can. Get help right away if:  You develop sudden, severe back pain. If this happens, stop doing the exercises right away. Do not do them again unless your health care provider says that you can. This information is not intended to replace advice given to you by your health care provider. Make sure you discuss any questions you have with your health care provider. Document Revised: 06/22/2018 Document  Reviewed: 11/17/2017 Elsevier Patient Education  [place].  Back Exercises The following exercises strengthen the muscles that help to support the trunk and back. They also help to keep the lower back flexible. Doing these exercises can help to prevent back pain or lessen existing pain.  If you have back pain or discomfort, try doing these exercises 2-3 times each day or as told by your health care provider.  As your pain improves, do them once each day, but increase the number of times that you repeat the steps for each exercise (do more repetitions).  To prevent the recurrence of back pain, continue to do these exercises once each day or as told by your health care provider. Do exercises exactly as told by your health care provider and adjust them as directed. It is normal to feel mild  stretching, pulling, tightness, or discomfort as you do these exercises, but you should stop right away if you feel sudden pain or your pain gets worse. Exercises Single knee to chest Repeat these steps 3-5 times for each leg: 7. Lie on your back on a firm bed or the floor with your legs extended. 8. Bring one knee to your chest. Your other leg should stay extended and in contact with the floor. 9. Hold your knee in place by grabbing your knee or thigh with both hands and hold. 10. Pull on your knee until you feel a gentle stretch in your lower back or buttocks. 11. Hold the stretch for 10-30 seconds. 12. Slowly release and straighten your leg. Pelvic tilt Repeat these steps 5-10 times: 5. Lie on your back on a firm bed or the floor with your legs extended. 6. Bend your knees so they are pointing toward the ceiling and your feet are flat on the floor. 7. Tighten your lower abdominal muscles to press your lower back against the floor. This motion will tilt your pelvis so your tailbone points up toward the ceiling instead of pointing to your feet or the floor. 8. With gentle tension and even  breathing, hold this position for 5-10 seconds. Cat-cow Repeat these steps until your lower back becomes more flexible: 6. Get into a hands-and-knees position on a firm surface. Keep your hands under your shoulders, and keep your knees under your hips. You may place padding under your knees for comfort. 7. Let your head hang down toward your chest. Contract your abdominal muscles and point your tailbone toward the floor so your lower back becomes rounded like the back of a cat. 8. Hold this position for 5 seconds. 9. Slowly lift your head, let your abdominal muscles relax and point your tailbone up toward the ceiling so your back forms a sagging arch like the back of a cow. 10. Hold this position for 5 seconds.  Press-ups Repeat these steps 5-10 times: 6. Lie on your abdomen (face-down) on the floor. 7. Place your palms near your head, about shoulder-width apart. 8. Keeping your back as relaxed as possible and keeping your hips on the floor, slowly straighten your arms to raise the top half of your body and lift your shoulders. Do not use your back muscles to raise your upper torso. You may adjust the placement of your hands to make yourself more comfortable. 9. Hold this position for 5 seconds while you keep your back relaxed. 10. Slowly return to lying flat on the floor.  Bridges Repeat these steps 10 times: 6. Lie on your back on a firm surface. 7. Bend your knees so they are pointing toward the ceiling and your feet are flat on the floor. Your arms should be flat at your sides, next to your body. 8. Tighten your buttocks muscles and lift your buttocks off the floor until your waist is at almost the same height as your knees. You should feel the muscles working in your buttocks and the back of your thighs. If you do not feel these muscles, slide your feet 1-2 inches farther away from your buttocks. 9. Hold this position for 3-5 seconds. 10. Slowly lower your hips to the starting position,  and allow your buttocks muscles to relax completely. If this exercise is too easy, try doing it with your arms crossed over your chest. Abdominal crunches Repeat these steps 5-10 times: 7. Lie on your back on a firm bed or the  floor with your legs extended. 8. Bend your knees so they are pointing toward the ceiling and your feet are flat on the floor. 9. Cross your arms over your chest. 10. Tip your chin slightly toward your chest without bending your neck. 11. Tighten your abdominal muscles and slowly raise your trunk (torso) high enough to lift your shoulder blades a tiny bit off the floor. Avoid raising your torso higher than that because it can put too much stress on your low back and does not help to strengthen your abdominal muscles. 12. Slowly return to your starting position. Back lifts Repeat these steps 5-10 times: 6. Lie on your abdomen (face-down) with your arms at your sides, and rest your forehead on the floor. 7. Tighten the muscles in your legs and your buttocks. 8. Slowly lift your chest off the floor while you keep your hips pressed to the floor. Keep the back of your head in line with the curve in your back. Your eyes should be looking at the floor. 9. Hold this position for 3-5 seconds. 10. Slowly return to your starting position. Contact a health care provider if:  Your back pain or discomfort gets much worse when you do an exercise.  Your worsening back pain or discomfort does not lessen within 2 hours after you exercise. If you have any of these problems, stop doing these exercises right away. Do not do them again unless your health care provider says that you can. Get help right away if:  You develop sudden, severe back pain. If this happens, stop doing the exercises right away. Do not do them again unless your health care provider says that you can. This information is not intended to replace advice given to you by your health care provider. Make sure you discuss  any questions you have with your health care provider. Document Revised: 06/22/2018 Document Reviewed: 11/17/2017 Elsevier Patient Education  2020 [employer].

## 2019-05-01 NOTE — Progress Notes (Signed)
Subjective:    Patient ID: Brian Roach, male    DOB: [birth year], 59 y.o.   MRN: [id number]   HPI: Brian Roach is a 59 y.o. male presenting for lower left back pain. NKI. Can hardly walk due to pain. Having to use a walker. Onset on 2/26. Tried tylenol and muscle relaxer not helping. Can't use NSAIDs due to use of eliquis. Reports lifting wood and carrying it out of the woods. Forty pound load. No Radiatioin. Throbs sometimes. Stabs with movement.    Depression screen [facility] 2/9 04/17/2019 01/10/2019 04/27/2018 04/19/2018 12/28/2017  Decreased Interest 0 0 0 0 0  Down, Depressed, Hopeless 0 0 0 0 0  PHQ - 2 Score 0 0 0 0 0     Relevant past medical, surgical, family and social history reviewed and updated as indicated.  Interim medical history since our last visit reviewed. Allergies and medications reviewed and updated.  ROS:  Review of Systems  Constitutional: Negative for chills, diaphoresis and fever.  HENT: Negative for sore throat.   Respiratory: Negative for shortness of breath.   Cardiovascular: Negative for chest pain.  Gastrointestinal: Negative for abdominal pain.  Musculoskeletal: Positive for back pain, gait problem and myalgias. Negative for arthralgias and neck pain.  Skin: Negative for rash.  Neurological: Positive for weakness. Negative for numbness.     Social History   Tobacco Use  Smoking Status Never Smoker  Smokeless Tobacco Never Used       Objective:     Wt Readings from Last 3 Encounters:  04/17/19 277 lb 12.8 oz (126 kg)  01/10/19 278 lb 6.4 oz (126.3 kg)  04/27/18 276 lb 12.8 oz (125.6 kg)     Exam deferred. Pt. Harboring due to COVID 19. Phone visit performed.   Assessment & Plan:   1. Acute midline low back pain without sciatica     Meds ordered this encounter  Medications  . predniSONE (DELTASONE) 10 MG tablet    Sig: Take 5 daily for 3 days followed by 4,3,2 and 1 for 3 days each.    Dispense:  45 tablet    Refill:  0      No orders of the defined types were placed in this encounter.     Diagnoses and all orders for this visit:  Acute midline low back pain without sciatica  Other orders -     predniSONE (DELTASONE) 10 MG tablet; Take 5 daily for 3 days followed by 4,3,2 and 1 for 3 days each.   Patient is advised to perform low back exercises.  I included these in the after visit summary that is included in his MyChart account. Virtual Visit via telephone Note  I discussed the limitations, risks, security and privacy concerns of performing an evaluation and management service by telephone and the availability of in person appointments. The patient was identified with two identifiers. Pt.expressed understanding and agreed to proceed. Pt. Is at home. [name] is in his office.  Follow Up Instructions:   I discussed the assessment and treatment plan with the patient. The patient was provided an opportunity to ask questions and all were answered. The patient agreed with the plan and demonstrated an understanding of the instructions.   The patient was advised to call back or seek an in-person evaluation if the symptoms worsen or if the condition fails to improve as anticipated.   Total minutes including chart review and phone contact time: 13   Follow up plan:  No follow-ups on file.  Claretta Fraise, MD [facility]

## 2019-05-18 ENCOUNTER — Ambulatory Visit: Payer: BC Managed Care – PPO | Attending: Internal Medicine

## 2019-05-18 DIAGNOSIS — Z23 Encounter for immunization: Secondary | ICD-10-CM

## 2019-05-18 NOTE — Progress Notes (Signed)
   Covid-19 Vaccination Clinic  Name:  Brian Roach    MRN: [id number] DOB: 28-May-1960  05/18/2019  Mr. [name] was observed post Covid-19 immunization for 15 minutes without incident. He was provided with Vaccine Information Sheet and instruction to access the V-Safe system.   Mr. [name] was instructed to call 911 with any severe reactions post vaccine: Marland Kitchen Difficulty breathing  . Swelling of face and throat  . A fast heartbeat  . A bad rash all over body  . Dizziness and weakness   Immunizations Administered    Name Date Dose VIS Date Route   Moderna COVID-19 Vaccine 05/18/2019  8:58 AM 0.5 mL 01/30/2019 Intramuscular   Manufacturer: Moderna   Lot: [id number]   NDC: 08022-336-12

## 2019-06-20 ENCOUNTER — Ambulatory Visit: Payer: BC Managed Care – PPO | Attending: Internal Medicine

## 2019-06-20 DIAGNOSIS — Z23 Encounter for immunization: Secondary | ICD-10-CM

## 2019-06-20 NOTE — Progress Notes (Signed)
   Covid-19 Vaccination Clinic  Name:  JORDAN PARDINI    MRN: [id number] DOB: 10-24-1960  06/20/2019  Mr. Hoogland was observed post Covid-19 immunization for 15 minutes without incident. He was provided with Vaccine Information Sheet and instruction to access the V-Safe system.   Mr. Weatherford was instructed to call 911 with any severe reactions post vaccine: Marland Kitchen Difficulty breathing  . Swelling of face and throat  . A fast heartbeat  . A bad rash all over body  . Dizziness and weakness   Immunizations Administered    Name Date Dose VIS Date Route   Moderna COVID-19 Vaccine 06/20/2019  8:23 AM 0.5 mL 01/2019 Intramuscular   Manufacturer: Moderna   Lot: [id number]   NDC: 83073-543-01

## 2019-07-02 ENCOUNTER — Other Ambulatory Visit: Payer: Self-pay | Admitting: Family Medicine

## 2019-07-02 DIAGNOSIS — E119 Type 2 diabetes mellitus without complications: Secondary | ICD-10-CM

## 2019-07-18 ENCOUNTER — Ambulatory Visit: Payer: BC Managed Care – PPO | Admitting: Family Medicine

## 2019-07-18 ENCOUNTER — Encounter: Payer: Self-pay | Admitting: Family Medicine

## 2019-07-18 ENCOUNTER — Other Ambulatory Visit: Payer: Self-pay

## 2019-07-18 VITALS — BP 126/80 | HR 93 | Temp 97.4°F | Resp 20 | Ht 70.0 in | Wt 270.5 lb

## 2019-07-18 DIAGNOSIS — I10 Essential (primary) hypertension: Secondary | ICD-10-CM

## 2019-07-18 DIAGNOSIS — E1165 Type 2 diabetes mellitus with hyperglycemia: Secondary | ICD-10-CM | POA: Diagnosis not present

## 2019-07-18 DIAGNOSIS — I471 Supraventricular tachycardia: Secondary | ICD-10-CM | POA: Diagnosis not present

## 2019-07-18 DIAGNOSIS — K219 Gastro-esophageal reflux disease without esophagitis: Secondary | ICD-10-CM

## 2019-07-18 DIAGNOSIS — J3089 Other allergic rhinitis: Secondary | ICD-10-CM

## 2019-07-18 DIAGNOSIS — G4701 Insomnia due to medical condition: Secondary | ICD-10-CM

## 2019-07-18 DIAGNOSIS — E7800 Pure hypercholesterolemia, unspecified: Secondary | ICD-10-CM | POA: Diagnosis not present

## 2019-07-18 DIAGNOSIS — Z7901 Long term (current) use of anticoagulants: Secondary | ICD-10-CM

## 2019-07-18 DIAGNOSIS — M8949 Other hypertrophic osteoarthropathy, multiple sites: Secondary | ICD-10-CM

## 2019-07-18 DIAGNOSIS — M159 Polyosteoarthritis, unspecified: Secondary | ICD-10-CM

## 2019-07-18 LAB — BAYER DCA HB A1C WAIVED: HB A1C (BAYER DCA - WAIVED): 8.1 % — ABNORMAL HIGH (ref <7.0)

## 2019-07-18 MED ORDER — APIXABAN 5 MG PO TABS: 5.0000 mg | ORAL_TABLET | Freq: Two times a day (BID) | ORAL | 1 refills | Status: DC

## 2019-07-18 MED ORDER — DILTIAZEM HCL ER COATED BEADS 360 MG PO CP24: ORAL_CAPSULE | ORAL | 1 refills | Status: DC

## 2019-07-18 MED ORDER — TRAZODONE HCL 300 MG PO TABS: ORAL_TABLET | ORAL | 1 refills | Status: DC

## 2019-07-18 MED ORDER — OMEPRAZOLE 20 MG PO CPDR: 20.0000 mg | DELAYED_RELEASE_CAPSULE | Freq: Every day | ORAL | 1 refills | Status: DC

## 2019-07-18 MED ORDER — ATORVASTATIN CALCIUM 20 MG PO TABS: ORAL_TABLET | ORAL | 1 refills | Status: DC

## 2019-07-18 MED ORDER — METFORMIN HCL ER 750 MG PO TB24: ORAL_TABLET | ORAL | 1 refills | Status: DC

## 2019-07-18 MED ORDER — PREDNISONE 10 MG (21) PO TBPK
ORAL_TABLET | ORAL | 0 refills | Status: DC
Start: 2019-07-18 — End: 2019-10-17

## 2019-07-18 MED ORDER — PIOGLITAZONE HCL 30 MG PO TABS: 30.0000 mg | ORAL_TABLET | Freq: Every day | ORAL | 2 refills | Status: DC

## 2019-07-18 MED ORDER — SITAGLIPTIN PHOSPHATE 100 MG PO TABS: 100.0000 mg | ORAL_TABLET | Freq: Every day | ORAL | 1 refills | Status: DC

## 2019-07-18 MED ORDER — BENAZEPRIL HCL 20 MG PO TABS: 20.0000 mg | ORAL_TABLET | Freq: Every day | ORAL | 1 refills | Status: DC

## 2019-07-18 NOTE — Progress Notes (Signed)
 Subjective:  Patient ID: Brian Roach,  male    DOB: 07/13/1960  Age: 58 y.o.    CC: Medical Management of Chronic Issues   HPI Brian Roach presents for  follow-up of hypertension. Patient has no history of headache chest pain or shortness of breath or recent cough. Patient also denies symptoms of TIA such as numbness weakness lateralizing. Patient denies side effects from medication. States taking it regularly.  Patient also  in for follow-up of elevated cholesterol. Doing well without complaints on current medication. Denies side effects  including myalgia and arthralgia and nausea. Also in today for liver function testing. Currently no chest pain, shortness of breath or other cardiovascular related symptoms noted.  Patient reports  weight loss.  Brian Roach body mass index is dropped below 40.  Follow-up of diabetes. Patient does check blood sugar at home. Readings run between 125-145 fasting and under 180 postprandial Patient denies symptoms such as excessive hunger or urinary frequency, excessive hunger, nausea No significant hypoglycemic spells noted. Medications reviewed. Pt reports taking them regularly. Pt. denies complication/adverse reaction today.   Patient continues to take Eliquis without signs of bleeding.  No recurrent shortness of breath etc. to indicate pulmonary embolism.  Patient has allergic rhinitis symptoms including sneezing frequently sniffling, posterior drainage and cough sometimes productive of phlegm, watery and itchy eyes. There has been no fever no chills no sweats. No earaches.     History Brian Roach has a past medical history of Diabetes mellitus without complication (HCC), Hyperlipidemia, Hypertension, and PE (pulmonary embolism).   Brian Roach has a past surgical history that includes Back surgery; Knee surgery; Lung surgery; Anterior cruciate ligament repair (Right); Wisdom tooth extraction; and Quadriceps tendon repair (Left, 09/23/2016).   Brian Roach family history  includes Heart disease in Brian Roach brother and father.Brian Roach reports that Brian Roach has never smoked. Brian Roach has never used smokeless tobacco. Brian Roach reports that Brian Roach does not drink alcohol or use drugs.  Current Outpatient Medications on File Prior to Visit  Medication Sig Dispense Refill  . cyclobenzaprine (FLEXERIL) 10 MG tablet TAKE 1 TABLET BY MOUTH THREE TIMES A DAY AS NEEDED FOR MUSCLE SPASMS 90 tablet 5  . fluticasone (FLONASE) 50 MCG/ACT nasal spray Place 2 sprays into both nostrils daily. 16 g 6  . linaclotide (LINZESS) 290 MCG CAPS capsule TAKE 1 CAPSULE (290 MCG TOTAL) BY MOUTH DAILY. TO REGULATE BOWEL MOVEMENTS 90 capsule 1  . Multiple Vitamins-Minerals (ONE-A-DAY MENS HEALTH FORMULA PO) Take 1 tablet by mouth daily.     No current facility-administered medications on file prior to visit.    ROS Review of Systems  Constitutional: Negative.   HENT: Positive for congestion and postnasal drip.   Eyes: Negative for visual disturbance.  Respiratory: Positive for cough. Negative for shortness of breath.   Cardiovascular: Negative for chest pain and leg swelling.  Gastrointestinal: Negative for abdominal pain, diarrhea, nausea and vomiting.  Genitourinary: Negative for difficulty urinating.  Musculoskeletal: Negative for arthralgias and myalgias.  Skin: Negative for rash.  Neurological: Negative for headaches.  Psychiatric/Behavioral: Negative for sleep disturbance.    Objective:  BP 126/80   Pulse 93   Temp (!) 97.4 F (36.3 C) (Temporal)   Resp 20   Ht 5' 10" (1.778 m)   Wt 270 lb 8 oz (122.7 kg)   SpO2 98%   BMI 38.81 kg/m   BP Readings from Last 3 Encounters:  07/18/19 126/80  04/17/19 137/82  01/10/19 138/86    Wt Readings from Last 3   Encounters:  07/18/19 270 lb 8 oz (122.7 kg)  04/17/19 277 lb 12.8 oz (126 kg)  01/10/19 278 lb 6.4 oz (126.3 kg)     Physical Exam Vitals reviewed.  Constitutional:      Appearance: Brian Roach is well-developed.  HENT:     Head: Normocephalic and  atraumatic.     Right Ear: Tympanic membrane and external ear normal. No decreased hearing noted.     Left Ear: Tympanic membrane and external ear normal. No decreased hearing noted.     Nose: Congestion present.     Mouth/Throat:     Mouth: Mucous membranes are moist.     Pharynx: No oropharyngeal exudate or posterior oropharyngeal erythema.  Eyes:     Pupils: Pupils are equal, round, and reactive to light.  Cardiovascular:     Rate and Rhythm: Normal rate and regular rhythm.     Heart sounds: No murmur.  Pulmonary:     Effort: No respiratory distress.     Breath sounds: Normal breath sounds.  Abdominal:     General: Bowel sounds are normal.     Palpations: Abdomen is soft. There is no mass.     Tenderness: There is no abdominal tenderness.  Musculoskeletal:     Cervical back: Normal range of motion and neck supple.     Diabetic Foot Exam - Simple   No data filed        Assessment & Plan:   Brian Roach was seen today for medical management of chronic issues.  Diagnoses and all orders for this visit:  Uncontrolled type 2 diabetes mellitus with hyperglycemia (HCC) -     Bayer DCA Hb A1c Waived -     CBC with Differential/Platelet -     CMP14+EGFR -     Lipid panel -     pioglitazone (ACTOS) 30 MG tablet; Take 1 tablet (30 mg total) by mouth daily. -     atorvastatin (LIPITOR) 20 MG tablet; TAKE 1 TABLET BY MOUTH EVERY DAY IN THE EVENING -     benazepril (LOTENSIN) 20 MG tablet; Take 1 tablet (20 mg total) by mouth daily. -     sitaGLIPtin (JANUVIA) 100 MG tablet; Take 1 tablet (100 mg total) by mouth daily. -     metFORMIN (GLUCOPHAGE-XR) 750 MG 24 hr tablet; TAKE 1 TABLET BY MOUTH With Breakfast and supper  HTN (hypertension), benign -     CBC with Differential/Platelet -     CMP14+EGFR -     Lipid panel -     benazepril (LOTENSIN) 20 MG tablet; Take 1 tablet (20 mg total) by mouth daily. -     diltiazem (CARDIZEM CD) 360 MG 24 hr capsule; TAKE 1 CAPSULE BY MOUTH  EVERY DAY  Hypercholesteremia -     CBC with Differential/Platelet -     CMP14+EGFR -     Lipid panel -     atorvastatin (LIPITOR) 20 MG tablet; TAKE 1 TABLET BY MOUTH EVERY DAY IN THE EVENING  Paroxysmal SVT (supraventricular tachycardia) (HCC) -     diltiazem (CARDIZEM CD) 360 MG 24 hr capsule; TAKE 1 CAPSULE BY MOUTH EVERY DAY  Primary osteoarthritis involving multiple joints  Long term current use of anticoagulant -     apixaban (ELIQUIS) 5 MG TABS tablet; Take 1 tablet (5 mg total) by mouth 2 (two) times daily.  Non-seasonal allergic rhinitis, unspecified trigger -     Ambulatory referral to Allergy -     pioglitazone (ACTOS) 30 MG   tablet; Take 1 tablet (30 mg total) by mouth daily.  Gastroesophageal reflux disease without esophagitis -     omeprazole (PRILOSEC) 20 MG capsule; Take 1 capsule (20 mg total) by mouth daily.  Insomnia due to medical condition -     trazodone (DESYREL) 300 MG tablet; TAKE 1 TABLET BY MOUTH EVERYDAY AT BEDTIME  Other orders -     predniSONE (STERAPRED UNI-PAK 21 TAB) 10 MG (21) TBPK tablet; Use as package directs   I have discontinued [name]'s cetirizine, amoxicillin-clavulanate, fexofenadine-pseudoephedrine, and predniSONE. I have also changed Brian Roach Januvia to sitaGLIPtin. Additionally, I am having him start on pioglitazone and predniSONE. Lastly, I am having him maintain Brian Roach Multiple Vitamins-Minerals (ONE-A-DAY MENS HEALTH FORMULA PO), fluticasone, cyclobenzaprine, linaclotide, apixaban, atorvastatin, benazepril, diltiazem, metFORMIN, omeprazole, and trazodone.  Meds ordered this encounter  Medications  . pioglitazone (ACTOS) 30 MG tablet    Sig: Take 1 tablet (30 mg total) by mouth daily.    Dispense:  30 tablet    Refill:  2  . predniSONE (STERAPRED UNI-PAK 21 TAB) 10 MG (21) TBPK tablet    Sig: Use as package directs    Dispense:  21 tablet    Refill:  0  . apixaban (ELIQUIS) 5 MG TABS tablet    Sig: Take 1 tablet (5  mg total) by mouth 2 (two) times daily.    Dispense:  180 tablet    Refill:  1  . atorvastatin (LIPITOR) 20 MG tablet    Sig: TAKE 1 TABLET BY MOUTH EVERY DAY IN THE EVENING    Dispense:  90 tablet    Refill:  1  . benazepril (LOTENSIN) 20 MG tablet    Sig: Take 1 tablet (20 mg total) by mouth daily.    Dispense:  90 tablet    Refill:  1  . diltiazem (CARDIZEM CD) 360 MG 24 hr capsule    Sig: TAKE 1 CAPSULE BY MOUTH EVERY DAY    Dispense:  90 capsule    Refill:  1  . sitaGLIPtin (JANUVIA) 100 MG tablet    Sig: Take 1 tablet (100 mg total) by mouth daily.    Dispense:  90 tablet    Refill:  1  . metFORMIN (GLUCOPHAGE-XR) 750 MG 24 hr tablet    Sig: TAKE 1 TABLET BY MOUTH With Breakfast and supper    Dispense:  180 tablet    Refill:  1  . omeprazole (PRILOSEC) 20 MG capsule    Sig: Take 1 capsule (20 mg total) by mouth daily.    Dispense:  90 capsule    Refill:  1  . trazodone (DESYREL) 300 MG tablet    Sig: TAKE 1 TABLET BY MOUTH EVERYDAY AT BEDTIME    Dispense:  90 tablet    Refill:  1     Follow-up: Return in about 3 months (around 10/18/2019).  Claretta Fraise, M.D.

## 2019-07-19 LAB — CBC WITH DIFFERENTIAL/PLATELET
Basophils Absolute: 0.1 10*3/uL (ref 0.0–0.2)
Basos: 1 %
EOS (ABSOLUTE): 0.5 10*3/uL — ABNORMAL HIGH (ref 0.0–0.4)
Eos: 5 %
Hematocrit: 46.5 % (ref 37.5–51.0)
Hemoglobin: 15.1 g/dL (ref 13.0–17.7)
Immature Grans (Abs): 0.1 10*3/uL (ref 0.0–0.1)
Immature Granulocytes: 1 %
Lymphocytes Absolute: 2.4 10*3/uL (ref 0.7–3.1)
Lymphs: 25 %
MCH: 30.3 pg (ref 26.6–33.0)
MCHC: 32.5 g/dL (ref 31.5–35.7)
MCV: 93 fL (ref 79–97)
Monocytes Absolute: 0.8 10*3/uL (ref 0.1–0.9)
Monocytes: 8 %
Neutrophils Absolute: 5.9 10*3/uL (ref 1.4–7.0)
Neutrophils: 60 %
Platelets: 268 10*3/uL (ref 150–450)
RBC: 4.99 x10E6/uL (ref 4.14–5.80)
RDW: 12.7 % (ref 11.6–15.4)
WBC: 9.7 10*3/uL (ref 3.4–10.8)

## 2019-07-19 LAB — CMP14+EGFR
ALT: 53 IU/L — ABNORMAL HIGH (ref 0–44)
AST: 37 IU/L (ref 0–40)
Albumin/Globulin Ratio: 1.6 (ref 1.2–2.2)
Albumin: 4.4 g/dL (ref 3.8–4.9)
Alkaline Phosphatase: 77 IU/L (ref 48–121)
BUN/Creatinine Ratio: 14 (ref 10–24)
BUN: 12 mg/dL (ref 8–27)
Bilirubin Total: 0.4 mg/dL (ref 0.0–1.2)
CO2: 29 mmol/L (ref 20–29)
Calcium: 9.4 mg/dL (ref 8.6–10.2)
Chloride: 100 mmol/L (ref 96–106)
Creatinine, Ser: 0.86 mg/dL (ref 0.76–1.27)
GFR calc Af Amer: 109 mL/min/{1.73_m2} (ref >59)
GFR calc non Af Amer: 94 mL/min/{1.73_m2} (ref >59)
Globulin, Total: 2.7 g/dL (ref 1.5–4.5)
Glucose: 152 mg/dL — ABNORMAL HIGH (ref 65–99)
Potassium: 4.2 mmol/L (ref 3.5–5.2)
Sodium: 142 mmol/L (ref 134–144)
Total Protein: 7.1 g/dL (ref 6.0–8.5)

## 2019-07-19 LAB — LIPID PANEL
Chol/HDL Ratio: 2.9 ratio (ref 0.0–5.0)
Cholesterol, Total: 107 mg/dL (ref 100–199)
HDL: 37 mg/dL — ABNORMAL LOW (ref >39)
LDL Chol Calc (NIH): 53 mg/dL (ref 0–99)
Triglycerides: 89 mg/dL (ref 0–149)
VLDL Cholesterol Cal: 17 mg/dL (ref 5–40)

## 2019-08-09 ENCOUNTER — Other Ambulatory Visit: Payer: Self-pay | Admitting: Family Medicine

## 2019-10-09 ENCOUNTER — Other Ambulatory Visit: Payer: Self-pay | Admitting: Family Medicine

## 2019-10-09 DIAGNOSIS — E1165 Type 2 diabetes mellitus with hyperglycemia: Secondary | ICD-10-CM

## 2019-10-09 DIAGNOSIS — J3089 Other allergic rhinitis: Secondary | ICD-10-CM

## 2019-10-17 ENCOUNTER — Other Ambulatory Visit: Payer: Self-pay

## 2019-10-17 ENCOUNTER — Ambulatory Visit (INDEPENDENT_AMBULATORY_CARE_PROVIDER_SITE_OTHER): Payer: BC Managed Care – PPO

## 2019-10-17 ENCOUNTER — Ambulatory Visit: Payer: BC Managed Care – PPO | Admitting: Family Medicine

## 2019-10-17 ENCOUNTER — Encounter: Payer: Self-pay | Admitting: Family Medicine

## 2019-10-17 VITALS — BP 124/79 | HR 87 | Temp 98.1°F | Resp 20 | Ht 70.0 in | Wt 283.0 lb

## 2019-10-17 DIAGNOSIS — G4701 Insomnia due to medical condition: Secondary | ICD-10-CM

## 2019-10-17 DIAGNOSIS — R2242 Localized swelling, mass and lump, left lower limb: Secondary | ICD-10-CM

## 2019-10-17 DIAGNOSIS — E7800 Pure hypercholesterolemia, unspecified: Secondary | ICD-10-CM | POA: Diagnosis not present

## 2019-10-17 DIAGNOSIS — E1165 Type 2 diabetes mellitus with hyperglycemia: Secondary | ICD-10-CM | POA: Diagnosis not present

## 2019-10-17 DIAGNOSIS — Z7901 Long term (current) use of anticoagulants: Secondary | ICD-10-CM | POA: Diagnosis not present

## 2019-10-17 DIAGNOSIS — R2240 Localized swelling, mass and lump, unspecified lower limb: Secondary | ICD-10-CM

## 2019-10-17 DIAGNOSIS — I10 Essential (primary) hypertension: Secondary | ICD-10-CM

## 2019-10-17 DIAGNOSIS — K580 Irritable bowel syndrome with diarrhea: Secondary | ICD-10-CM

## 2019-10-17 DIAGNOSIS — I471 Supraventricular tachycardia: Secondary | ICD-10-CM

## 2019-10-17 DIAGNOSIS — K219 Gastro-esophageal reflux disease without esophagitis: Secondary | ICD-10-CM

## 2019-10-17 DIAGNOSIS — J3089 Other allergic rhinitis: Secondary | ICD-10-CM

## 2019-10-17 LAB — BAYER DCA HB A1C WAIVED: HB A1C (BAYER DCA - WAIVED): 7 % — ABNORMAL HIGH (ref <7.0)

## 2019-10-17 MED ORDER — OMEPRAZOLE 20 MG PO CPDR: 20.0000 mg | DELAYED_RELEASE_CAPSULE | Freq: Every day | ORAL | 1 refills | Status: DC

## 2019-10-17 MED ORDER — SITAGLIPTIN PHOSPHATE 100 MG PO TABS: 100.0000 mg | ORAL_TABLET | Freq: Every day | ORAL | 1 refills | Status: DC

## 2019-10-17 MED ORDER — METFORMIN HCL ER 750 MG PO TB24: ORAL_TABLET | ORAL | 1 refills | Status: DC

## 2019-10-17 MED ORDER — DILTIAZEM HCL ER COATED BEADS 360 MG PO CP24: ORAL_CAPSULE | ORAL | 1 refills | Status: DC

## 2019-10-17 MED ORDER — TRAZODONE HCL 300 MG PO TABS: ORAL_TABLET | ORAL | 1 refills | Status: DC

## 2019-10-17 MED ORDER — BENAZEPRIL HCL 20 MG PO TABS: 20.0000 mg | ORAL_TABLET | Freq: Every day | ORAL | 1 refills | Status: DC

## 2019-10-17 MED ORDER — APIXABAN 5 MG PO TABS: 5.0000 mg | ORAL_TABLET | Freq: Two times a day (BID) | ORAL | 1 refills | Status: DC

## 2019-10-17 MED ORDER — ATORVASTATIN CALCIUM 20 MG PO TABS: ORAL_TABLET | ORAL | 1 refills | Status: DC

## 2019-10-17 MED ORDER — LINACLOTIDE 290 MCG PO CAPS: ORAL_CAPSULE | ORAL | 1 refills | Status: DC

## 2019-10-17 MED ORDER — CYCLOBENZAPRINE HCL 10 MG PO TABS: 10.0000 mg | ORAL_TABLET | Freq: Three times a day (TID) | ORAL | 5 refills | Status: DC | PRN

## 2019-10-17 MED ORDER — PIOGLITAZONE HCL 30 MG PO TABS: 30.0000 mg | ORAL_TABLET | Freq: Every day | ORAL | 1 refills | Status: DC

## 2019-10-17 NOTE — Progress Notes (Signed)
Subjective:  Patient ID: Brian Roach,  male    DOB: [birth year]  Age: 59 y.o.    CC: Medical Management of Chronic Issues   HPI Brian Roach presents for  follow-up of hypertension. Patient has no history of headache chest pain or shortness of breath or recent cough. Patient also denies symptoms of TIA such as numbness weakness lateralizing. Patient denies side effects from medication. States taking it regularly.  Patient also  in for follow-up of elevated cholesterol. Doing well without complaints on current medication. Denies side effects  including myalgia and arthralgia and nausea. Also in today for liver function testing. Currently no chest pain, shortness of breath or other cardiovascular related symptoms noted.  Follow-up of diabetes. Patient does check blood sugar at home. Readings run between 110-125 and 110-170 postprandial on log.  Patient denies symptoms such as excessive hunger or urinary frequency, excessive hunger, nausea No significant hypoglycemic spells noted. Medications reviewed. Pt reports taking them regularly. Pt. denies complication/adverse reaction today.    History Brian Roach has a past medical history of Diabetes mellitus without complication ([place]), Hyperlipidemia, Hypertension, and PE (pulmonary embolism).   He has a past surgical history that includes Back surgery; Knee surgery; Lung surgery; Anterior cruciate ligament repair (Right); Wisdom tooth extraction; and Quadriceps tendon repair (Left, 09/23/2016).   His family history includes Heart disease in his brother and father.He reports that he has never smoked. He has never used smokeless tobacco. He reports that he does not drink alcohol and does not use drugs.  Current Outpatient Medications on File Prior to Visit  Medication Sig Dispense Refill  . fluticasone (FLONASE) 50 MCG/ACT nasal spray Place 2 sprays into both nostrils daily. 16 g 6  . Multiple Vitamins-Minerals (ONE-A-DAY MENS HEALTH FORMULA  PO) Take 1 tablet by mouth daily.     No current facility-administered medications on file prior to visit.    ROS Review of Systems  Constitutional: Negative.   HENT: Negative.   Eyes: Negative for visual disturbance.  Respiratory: Negative for cough and shortness of breath.   Cardiovascular: Negative for chest pain and leg swelling.  Gastrointestinal: Positive for constipation. Negative for abdominal pain, diarrhea, nausea and vomiting.  Genitourinary: Negative for difficulty urinating.  Musculoskeletal: Negative for arthralgias and myalgias.  Skin: Negative for rash.  Neurological: Negative for headaches.  Psychiatric/Behavioral: Negative for sleep disturbance.    Objective:  BP 124/79   Pulse 87   Temp 98.1 F (36.7 C) (Temporal)   Resp 20   Ht _0  (1.778 m)   Wt 283 lb (128.4 kg)   SpO2 94%   BMI 40.61 kg/m   BP Readings from Last 3 Encounters:  10/17/19 124/79  07/18/19 126/80  04/17/19 137/82    Wt Readings from Last 3 Encounters:  10/17/19 283 lb (128.4 kg)  07/18/19 270 lb 8 oz (122.7 kg)  04/17/19 277 lb 12.8 oz (126 kg)     Physical Exam Constitutional:      General: He is not in acute distress.    Appearance: He is well-developed. He is obese.  HENT:     Head: Normocephalic and atraumatic.     Right Ear: External ear normal.     Left Ear: External ear normal.     Nose: Nose normal.  Eyes:     Conjunctiva/sclera: Conjunctivae normal.     Pupils: Pupils are equal, round, and reactive to light.  Cardiovascular:     Rate and Rhythm: Normal rate and regular  rhythm.     Heart sounds: Normal heart sounds. No murmur heard.   Pulmonary:     Effort: Pulmonary effort is normal. No respiratory distress.     Breath sounds: Normal breath sounds. No wheezing or rales.  Abdominal:     Palpations: Abdomen is soft.     Tenderness: There is no abdominal tenderness.  Musculoskeletal:        General: Normal range of motion.     Cervical back: Normal range  of motion and neck supple.  Skin:    General: Skin is warm and dry.  Neurological:     Mental Status: He is alert and oriented to person, place, and time.     Deep Tendon Reflexes: Reflexes are normal and symmetric.  Psychiatric:        Behavior: Behavior normal.        Thought Content: Thought content normal.        Judgment: Judgment normal.     Diabetic Foot Exam - Simple   Simple Foot Form Diabetic Foot exam was performed with the following findings: Yes 10/17/2019  9:29 AM  Visual Inspection No deformities, no ulcerations, no other skin breakdown bilaterally: Yes See comments: Yes Sensation Testing Intact to touch and monofilament testing bilaterally: Yes Pulse Check Posterior Tibialis and Dorsalis pulse intact bilaterally: Yes Comments Early fungal changes under nails.        Assessment & Plan:   Brian Roach was seen today for medical management of chronic issues.  Diagnoses and all orders for this visit:  HTN (hypertension), benign -     CBC with Differential/Platelet -     CMP14+EGFR -     Lipid panel -     benazepril (LOTENSIN) 20 MG tablet; Take 1 tablet (20 mg total) by mouth daily. -     diltiazem (CARDIZEM CD) 360 MG 24 hr capsule; TAKE 1 CAPSULE BY MOUTH EVERY DAY  Hypercholesteremia -     CBC with Differential/Platelet -     CMP14+EGFR -     Lipid panel -     atorvastatin (LIPITOR) 20 MG tablet; TAKE 1 TABLET BY MOUTH EVERY DAY IN THE EVENING  Uncontrolled type 2 diabetes mellitus with hyperglycemia (HCC) -     Bayer DCA Hb A1c Waived -     CBC with Differential/Platelet -     CMP14+EGFR -     Lipid panel -     atorvastatin (LIPITOR) 20 MG tablet; TAKE 1 TABLET BY MOUTH EVERY DAY IN THE EVENING -     benazepril (LOTENSIN) 20 MG tablet; Take 1 tablet (20 mg total) by mouth daily. -     metFORMIN (GLUCOPHAGE-XR) 750 MG 24 hr tablet; TAKE 1 TABLET BY MOUTH With Breakfast and supper -     pioglitazone (ACTOS) 30 MG tablet; Take 1 tablet (30 mg total) by  mouth daily. -     sitaGLIPtin (JANUVIA) 100 MG tablet; Take 1 tablet (100 mg total) by mouth daily.  Long term current use of anticoagulant -     apixaban (ELIQUIS) 5 MG TABS tablet; Take 1 tablet (5 mg total) by mouth 2 (two) times daily.  Paroxysmal SVT (supraventricular tachycardia) (HCC) -     diltiazem (CARDIZEM CD) 360 MG 24 hr capsule; TAKE 1 CAPSULE BY MOUTH EVERY DAY  Irritable bowel syndrome with diarrhea -     linaclotide (LINZESS) 290 MCG CAPS capsule; TAKE 1 CAPSULE (290 MCG TOTAL) BY MOUTH DAILY. TO REGULATE BOWEL MOVEMENTS  Gastroesophageal reflux disease  without esophagitis -     omeprazole (PRILOSEC) 20 MG capsule; Take 1 capsule (20 mg total) by mouth daily.  Non-seasonal allergic rhinitis, unspecified trigger -     pioglitazone (ACTOS) 30 MG tablet; Take 1 tablet (30 mg total) by mouth daily.  Insomnia due to medical condition -     trazodone (DESYREL) 300 MG tablet; TAKE 1 TABLET BY MOUTH EVERYDAY AT BEDTIME  Subcutaneous nodule of foot -     DG Foot Complete Left; Future  Other orders -     cyclobenzaprine (FLEXERIL) 10 MG tablet; Take 1 tablet (10 mg total) by mouth 3 (three) times daily as needed for muscle spasms.   I have discontinued [name]'s predniSONE. I have also changed his cyclobenzaprine and pioglitazone. Additionally, I am having him maintain his Multiple Vitamins-Minerals (ONE-A-DAY MENS HEALTH FORMULA PO), fluticasone, atorvastatin, apixaban, benazepril, diltiazem, linaclotide, metFORMIN, omeprazole, sitaGLIPtin, and trazodone.  Meds ordered this encounter  Medications  . atorvastatin (LIPITOR) 20 MG tablet    Sig: TAKE 1 TABLET BY MOUTH EVERY DAY IN THE EVENING    Dispense:  90 tablet    Refill:  1  . apixaban (ELIQUIS) 5 MG TABS tablet    Sig: Take 1 tablet (5 mg total) by mouth 2 (two) times daily.    Dispense:  180 tablet    Refill:  1  . benazepril (LOTENSIN) 20 MG tablet    Sig: Take 1 tablet (20 mg total) by mouth  daily.    Dispense:  90 tablet    Refill:  1  . cyclobenzaprine (FLEXERIL) 10 MG tablet    Sig: Take 1 tablet (10 mg total) by mouth 3 (three) times daily as needed for muscle spasms.    Dispense:  90 tablet    Refill:  5  . diltiazem (CARDIZEM CD) 360 MG 24 hr capsule    Sig: TAKE 1 CAPSULE BY MOUTH EVERY DAY    Dispense:  90 capsule    Refill:  1  . linaclotide (LINZESS) 290 MCG CAPS capsule    Sig: TAKE 1 CAPSULE (290 MCG TOTAL) BY MOUTH DAILY. TO REGULATE BOWEL MOVEMENTS    Dispense:  90 capsule    Refill:  1  . metFORMIN (GLUCOPHAGE-XR) 750 MG 24 hr tablet    Sig: TAKE 1 TABLET BY MOUTH With Breakfast and supper    Dispense:  180 tablet    Refill:  1  . omeprazole (PRILOSEC) 20 MG capsule    Sig: Take 1 capsule (20 mg total) by mouth daily.    Dispense:  90 capsule    Refill:  1  . pioglitazone (ACTOS) 30 MG tablet    Sig: Take 1 tablet (30 mg total) by mouth daily.    Dispense:  90 tablet    Refill:  1  . sitaGLIPtin (JANUVIA) 100 MG tablet    Sig: Take 1 tablet (100 mg total) by mouth daily.    Dispense:  90 tablet    Refill:  1  . trazodone (DESYREL) 300 MG tablet    Sig: TAKE 1 TABLET BY MOUTH EVERYDAY AT BEDTIME    Dispense:  90 tablet    Refill:  1    Wt. Loss counseling done Follow-up: Return in about 3 months (around 01/17/2020).  Claretta Fraise, M.D.

## 2019-10-18 LAB — CBC WITH DIFFERENTIAL/PLATELET
Basophils Absolute: 0.1 10*3/uL (ref 0.0–0.2)
Basos: 1 %
EOS (ABSOLUTE): 0.3 10*3/uL (ref 0.0–0.4)
Eos: 3 %
Hematocrit: 44.7 % (ref 37.5–51.0)
Hemoglobin: 14.3 g/dL (ref 13.0–17.7)
Immature Grans (Abs): 0.1 10*3/uL (ref 0.0–0.1)
Immature Granulocytes: 1 %
Lymphocytes Absolute: 2.2 10*3/uL (ref 0.7–3.1)
Lymphs: 23 %
MCH: 29.6 pg (ref 26.6–33.0)
MCHC: 32 g/dL (ref 31.5–35.7)
MCV: 93 fL (ref 79–97)
Monocytes Absolute: 0.8 10*3/uL (ref 0.1–0.9)
Monocytes: 9 %
Neutrophils Absolute: 6.1 10*3/uL (ref 1.4–7.0)
Neutrophils: 63 %
Platelets: 251 10*3/uL (ref 150–450)
RBC: 4.83 x10E6/uL (ref 4.14–5.80)
RDW: 13.2 % (ref 11.6–15.4)
WBC: 9.6 10*3/uL (ref 3.4–10.8)

## 2019-10-18 LAB — LIPID PANEL
Chol/HDL Ratio: 2.2 ratio (ref 0.0–5.0)
Cholesterol, Total: 110 mg/dL (ref 100–199)
HDL: 51 mg/dL (ref >39)
LDL Chol Calc (NIH): 45 mg/dL (ref 0–99)
Triglycerides: 65 mg/dL (ref 0–149)
VLDL Cholesterol Cal: 14 mg/dL (ref 5–40)

## 2019-10-18 LAB — CMP14+EGFR
ALT: 45 IU/L — ABNORMAL HIGH (ref 0–44)
AST: 29 IU/L (ref 0–40)
Albumin/Globulin Ratio: 1.9 (ref 1.2–2.2)
Albumin: 4.6 g/dL (ref 3.8–4.8)
Alkaline Phosphatase: 62 IU/L (ref 48–121)
BUN/Creatinine Ratio: 16 (ref 10–24)
BUN: 14 mg/dL (ref 8–27)
Bilirubin Total: 0.3 mg/dL (ref 0.0–1.2)
CO2: 26 mmol/L (ref 20–29)
Calcium: 9.5 mg/dL (ref 8.6–10.2)
Chloride: 101 mmol/L (ref 96–106)
Creatinine, Ser: 0.85 mg/dL (ref 0.76–1.27)
GFR calc Af Amer: 109 mL/min/{1.73_m2} (ref >59)
GFR calc non Af Amer: 94 mL/min/{1.73_m2} (ref >59)
Globulin, Total: 2.4 g/dL (ref 1.5–4.5)
Glucose: 122 mg/dL — ABNORMAL HIGH (ref 65–99)
Potassium: 4.4 mmol/L (ref 3.5–5.2)
Sodium: 140 mmol/L (ref 134–144)
Total Protein: 7 g/dL (ref 6.0–8.5)

## 2019-10-23 NOTE — Progress Notes (Signed)
Hello Brian Roach,  Your lab result is normal and/or stable.Some minor variations that are not significant are commonly marked abnormal, but do not represent any medical problem for you.  Best regards, Mayda Shippee, M.D.

## 2019-12-24 ENCOUNTER — Other Ambulatory Visit: Payer: Self-pay | Admitting: Family Medicine

## 2020-01-04 ENCOUNTER — Other Ambulatory Visit: Payer: Self-pay | Admitting: *Deleted

## 2020-01-04 DIAGNOSIS — J0110 Acute frontal sinusitis, unspecified: Secondary | ICD-10-CM

## 2020-01-04 MED ORDER — FLUTICASONE PROPIONATE 50 MCG/ACT NA SUSP: 2.0000 | Freq: Every day | NASAL | 6 refills | Status: DC

## 2020-01-16 ENCOUNTER — Encounter: Payer: Self-pay | Admitting: Family Medicine

## 2020-01-16 ENCOUNTER — Ambulatory Visit: Payer: BC Managed Care – PPO | Admitting: Family Medicine

## 2020-01-16 ENCOUNTER — Other Ambulatory Visit: Payer: Self-pay

## 2020-01-16 VITALS — BP 130/71 | HR 91 | Temp 98.4°F | Ht 70.0 in | Wt 290.0 lb

## 2020-01-16 DIAGNOSIS — E1165 Type 2 diabetes mellitus with hyperglycemia: Secondary | ICD-10-CM | POA: Diagnosis not present

## 2020-01-16 DIAGNOSIS — E7800 Pure hypercholesterolemia, unspecified: Secondary | ICD-10-CM | POA: Diagnosis not present

## 2020-01-16 DIAGNOSIS — Z23 Encounter for immunization: Secondary | ICD-10-CM | POA: Diagnosis not present

## 2020-01-16 DIAGNOSIS — K219 Gastro-esophageal reflux disease without esophagitis: Secondary | ICD-10-CM | POA: Diagnosis not present

## 2020-01-16 DIAGNOSIS — I10 Essential (primary) hypertension: Secondary | ICD-10-CM

## 2020-01-16 LAB — CBC WITH DIFFERENTIAL/PLATELET
Basophils Absolute: 0.1 10*3/uL (ref 0.0–0.2)
Basos: 1 %
EOS (ABSOLUTE): 0.4 10*3/uL (ref 0.0–0.4)
Eos: 5 %
Hematocrit: 45.3 % (ref 37.5–51.0)
Hemoglobin: 15.1 g/dL (ref 13.0–17.7)
Immature Grans (Abs): 0.1 10*3/uL (ref 0.0–0.1)
Immature Granulocytes: 1 %
Lymphocytes Absolute: 2 10*3/uL (ref 0.7–3.1)
Lymphs: 21 %
MCH: 30.2 pg (ref 26.6–33.0)
MCHC: 33.3 g/dL (ref 31.5–35.7)
MCV: 91 fL (ref 79–97)
Monocytes Absolute: 1 10*3/uL — ABNORMAL HIGH (ref 0.1–0.9)
Monocytes: 11 %
Neutrophils Absolute: 5.8 10*3/uL (ref 1.4–7.0)
Neutrophils: 61 %
Platelets: 260 10*3/uL (ref 150–450)
RBC: 5 x10E6/uL (ref 4.14–5.80)
RDW: 12.8 % (ref 11.6–15.4)
WBC: 9.2 10*3/uL (ref 3.4–10.8)

## 2020-01-16 LAB — LIPID PANEL
Chol/HDL Ratio: 2.4 ratio (ref 0.0–5.0)
Cholesterol, Total: 108 mg/dL (ref 100–199)
HDL: 45 mg/dL (ref >39)
LDL Chol Calc (NIH): 46 mg/dL (ref 0–99)
Triglycerides: 87 mg/dL (ref 0–149)
VLDL Cholesterol Cal: 17 mg/dL (ref 5–40)

## 2020-01-16 LAB — CMP14+EGFR
ALT: 47 IU/L — ABNORMAL HIGH (ref 0–44)
AST: 31 IU/L (ref 0–40)
Albumin/Globulin Ratio: 1.7 (ref 1.2–2.2)
Albumin: 4.5 g/dL (ref 3.8–4.8)
Alkaline Phosphatase: 69 IU/L (ref 44–121)
BUN/Creatinine Ratio: 12 (ref 10–24)
BUN: 11 mg/dL (ref 8–27)
Bilirubin Total: 0.5 mg/dL (ref 0.0–1.2)
CO2: 22 mmol/L (ref 20–29)
Calcium: 9.5 mg/dL (ref 8.6–10.2)
Chloride: 96 mmol/L (ref 96–106)
Creatinine, Ser: 0.91 mg/dL (ref 0.76–1.27)
GFR calc Af Amer: 105 mL/min/{1.73_m2} (ref >59)
GFR calc non Af Amer: 91 mL/min/{1.73_m2} (ref >59)
Globulin, Total: 2.6 g/dL (ref 1.5–4.5)
Glucose: 211 mg/dL — ABNORMAL HIGH (ref 65–99)
Potassium: 4.5 mmol/L (ref 3.5–5.2)
Sodium: 137 mmol/L (ref 134–144)
Total Protein: 7.1 g/dL (ref 6.0–8.5)

## 2020-01-16 LAB — BAYER DCA HB A1C WAIVED: HB A1C (BAYER DCA - WAIVED): 7 % — ABNORMAL HIGH (ref <7.0)

## 2020-01-19 ENCOUNTER — Encounter: Payer: Self-pay | Admitting: Family Medicine

## 2020-01-19 NOTE — Progress Notes (Signed)
Subjective:  Patient ID: Brian Roach, male    DOB: 07/16/1960  Age: 59 y.o. MRN: [id number]  CC: Follow-up (3 month)   HPI Brian Roach presents forFollow-up of diabetes. Patient checks blood sugar at home.   120 fasting and 150-160 postprandial Patient denies symptoms such as polyuria, polydipsia, excessive hunger, nausea No significant hypoglycemic spells noted. Medications reviewed. Pt reports taking them regularly without complication/adverse reaction being reported today.   presents for  follow-up of hypertension. Patient has no history of headache chest pain or shortness of breath or recent cough. Patient also denies symptoms of TIA such as focal numbness or weakness. Patient denies side effects from medication. States taking it regularly.  in for follow-up of elevated cholesterol. Doing well without complaints on current medication. Denies side effects of statin including myalgia and arthralgia and nausea. Currently no chest pain, shortness of breath or other cardiovascular related symptoms noted.   History Brian Roach has a past medical history of Diabetes mellitus without complication ([place]), Hyperlipidemia, Hypertension, and PE (pulmonary embolism).   Brian Roach has a past surgical history that includes Back surgery; Knee surgery; Lung surgery; Anterior cruciate ligament repair (Right); Wisdom tooth extraction; and Quadriceps tendon repair (Left, 09/23/2016).   His family history includes Heart disease in his brother and father.Brian Roach reports that Brian Roach has never smoked. Brian Roach has never used smokeless tobacco. Brian Roach reports that Brian Roach does not drink alcohol and does not use drugs.  Current Outpatient Medications on File Prior to Visit  Medication Sig Dispense Refill  . apixaban (ELIQUIS) 5 MG TABS tablet Take 1 tablet (5 mg total) by mouth 2 (two) times daily. 180 tablet 1  . atorvastatin (LIPITOR) 20 MG tablet TAKE 1 TABLET BY MOUTH EVERY DAY IN THE EVENING 90 tablet 1  . benazepril (LOTENSIN) 20  MG tablet Take 1 tablet (20 mg total) by mouth daily. 90 tablet 1  . cyclobenzaprine (FLEXERIL) 10 MG tablet Take 1 tablet (10 mg total) by mouth 3 (three) times daily as needed for muscle spasms. 90 tablet 5  . diltiazem (CARDIZEM CD) 360 MG 24 hr capsule TAKE 1 CAPSULE BY MOUTH EVERY DAY 90 capsule 1  . fluticasone (FLONASE) 50 MCG/ACT nasal spray Place 2 sprays into both nostrils daily. 16 g 6  . linaclotide (LINZESS) 290 MCG CAPS capsule TAKE 1 CAPSULE (290 MCG TOTAL) BY MOUTH DAILY. TO REGULATE BOWEL MOVEMENTS 90 capsule 1  . metFORMIN (GLUCOPHAGE-XR) 750 MG 24 hr tablet TAKE 1 TABLET BY MOUTH With Breakfast and supper 180 tablet 1  . Multiple Vitamins-Minerals (ONE-A-DAY MENS HEALTH FORMULA PO) Take 1 tablet by mouth daily.    Marland Kitchen omeprazole (PRILOSEC) 20 MG capsule Take 1 capsule (20 mg total) by mouth daily. 90 capsule 1  . pioglitazone (ACTOS) 30 MG tablet Take 1 tablet (30 mg total) by mouth daily. 90 tablet 1  . sitaGLIPtin (JANUVIA) 100 MG tablet Take 1 tablet (100 mg total) by mouth daily. 90 tablet 1  . trazodone (DESYREL) 300 MG tablet TAKE 1 TABLET BY MOUTH EVERYDAY AT BEDTIME 90 tablet 1   No current facility-administered medications on file prior to visit.    ROS Review of Systems  Constitutional: Negative for fever.  Respiratory: Negative for shortness of breath.   Cardiovascular: Negative for chest pain.  Musculoskeletal: Negative for arthralgias.  Skin: Negative for rash.    Objective:  BP 130/71   Pulse 91   Temp 98.4 F (36.9 C) (Temporal)   Ht $R'5\' 10"'TF$  (1.778 m)  Wt 290 lb (131.5 kg)   BMI 41.61 kg/m   BP Readings from Last 3 Encounters:  01/16/20 130/71  10/17/19 124/79  07/18/19 126/80    Wt Readings from Last 3 Encounters:  01/16/20 290 lb (131.5 kg)  10/17/19 283 lb (128.4 kg)  07/18/19 270 lb 8 oz (122.7 kg)     Physical Exam Vitals reviewed.  Constitutional:      Appearance: Brian Roach is well-developed.  HENT:     Head: Normocephalic and  atraumatic.     Right Ear: Tympanic membrane and external ear normal. No decreased hearing noted.     Left Ear: Tympanic membrane and external ear normal. No decreased hearing noted.     Mouth/Throat:     Pharynx: No oropharyngeal exudate or posterior oropharyngeal erythema.  Eyes:     Pupils: Pupils are equal, round, and reactive to light.  Cardiovascular:     Rate and Rhythm: Normal rate and regular rhythm.     Heart sounds: No murmur heard.   Pulmonary:     Effort: No respiratory distress.     Breath sounds: Normal breath sounds.  Abdominal:     General: Bowel sounds are normal.     Palpations: Abdomen is soft. There is no mass.     Tenderness: There is no abdominal tenderness.  Musculoskeletal:     Cervical back: Normal range of motion and neck supple.    Results for orders placed or performed in visit on 01/16/20  CBC with Differential/Platelet  Result Value Ref Range   WBC 9.2 3.4 - 10.8 x10E3/uL   RBC 5.00 4.14 - 5.80 x10E6/uL   Hemoglobin 15.1 13.0 - 17.7 g/dL   Hematocrit 45.3 37.5 - 51.0 %   MCV 91 79 - 97 fL   MCH 30.2 26.6 - 33.0 pg   MCHC 33.3 31 - 35 g/dL   RDW 12.8 11.6 - 15.4 %   Platelets 260 150 - 450 x10E3/uL   Neutrophils 61 Not Estab. %   Lymphs 21 Not Estab. %   Monocytes 11 Not Estab. %   Eos 5 Not Estab. %   Basos 1 Not Estab. %   Neutrophils Absolute 5.8 1.40 - 7.00 x10E3/uL   Lymphocytes Absolute 2.0 0 - 3 x10E3/uL   Monocytes Absolute 1.0 (H) 0 - 0 x10E3/uL   EOS (ABSOLUTE) 0.4 0.0 - 0.4 x10E3/uL   Basophils Absolute 0.1 0 - 0 x10E3/uL   Immature Granulocytes 1 Not Estab. %   Immature Grans (Abs) 0.1 0.0 - 0.1 x10E3/uL  CMP14+EGFR  Result Value Ref Range   Glucose 211 (H) 65 - 99 mg/dL   BUN 11 8 - 27 mg/dL   Creatinine, Ser 0.91 0.76 - 1.27 mg/dL   GFR calc non Af Amer 91 >59 mL/min/1.73   GFR calc Af Amer 105 >59 mL/min/1.73   BUN/Creatinine Ratio 12 10 - 24   Sodium 137 134 - 144 mmol/L   Potassium 4.5 3.5 - 5.2 mmol/L   Chloride  96 96 - 106 mmol/L   CO2 22 20 - 29 mmol/L   Calcium 9.5 8.6 - 10.2 mg/dL   Total Protein 7.1 6.0 - 8.5 g/dL   Albumin 4.5 3.8 - 4.8 g/dL   Globulin, Total 2.6 1.5 - 4.5 g/dL   Albumin/Globulin Ratio 1.7 1.2 - 2.2   Bilirubin Total 0.5 0.0 - 1.2 mg/dL   Alkaline Phosphatase 69 44 - 121 IU/L   AST 31 0 - 40 IU/L   ALT 47 (H)  0 - 44 IU/L  Lipid panel  Result Value Ref Range   Cholesterol, Total 108 100 - 199 mg/dL   Triglycerides 87 0 - 149 mg/dL   HDL 45 >39 mg/dL   VLDL Cholesterol Cal 17 5 - 40 mg/dL   LDL Chol Calc (NIH) 46 0 - 99 mg/dL   Chol/HDL Ratio 2.4 0.0 - 5.0 ratio  Bayer DCA Hb A1c Waived  Result Value Ref Range   HB A1C (BAYER DCA - WAIVED) 7.0 (H) <7.0 %      Assessment & Plan:   Brian Roach was seen today for follow-up.  Diagnoses and all orders for this visit:  HTN (hypertension), benign -     CBC with Differential/Platelet -     CMP14+EGFR -     Lipid panel  Hypercholesteremia -     CBC with Differential/Platelet -     CMP14+EGFR -     Lipid panel  Uncontrolled type 2 diabetes mellitus with hyperglycemia (HCC) -     CBC with Differential/Platelet -     CMP14+EGFR -     Lipid panel -     Bayer DCA Hb A1c Waived  Gastroesophageal reflux disease without esophagitis -     CBC with Differential/Platelet -     CMP14+EGFR  Need for immunization against influenza -     Flu Vaccine QUAD 36+ mos IM      I am having Brian Presume "[name]" maintain his Multiple Vitamins-Minerals (ONE-A-DAY MENS HEALTH FORMULA PO), atorvastatin, apixaban, benazepril, cyclobenzaprine, diltiazem, linaclotide, metFORMIN, omeprazole, pioglitazone, sitaGLIPtin, trazodone, and fluticasone.  No orders of the defined types were placed in this encounter.    Follow-up: Return in about 3 months (around 04/17/2020).  Claretta Fraise, M.D.

## 2020-02-05 ENCOUNTER — Other Ambulatory Visit: Payer: Self-pay | Admitting: Family Medicine

## 2020-02-29 ENCOUNTER — Other Ambulatory Visit: Payer: Self-pay | Admitting: Family Medicine

## 2020-03-20 ENCOUNTER — Ambulatory Visit (INDEPENDENT_AMBULATORY_CARE_PROVIDER_SITE_OTHER): Payer: BC Managed Care – PPO | Admitting: Family Medicine

## 2020-03-20 ENCOUNTER — Encounter: Payer: Self-pay | Admitting: Family Medicine

## 2020-03-20 DIAGNOSIS — M545 Low back pain, unspecified: Secondary | ICD-10-CM | POA: Diagnosis not present

## 2020-03-20 MED ORDER — PREDNISONE 10 MG PO TABS: ORAL_TABLET | ORAL | 0 refills | Status: DC

## 2020-03-20 NOTE — Progress Notes (Signed)
    Subjective:    Patient ID: Brian Roach, male    DOB: [birth year], 60 y.o.   MRN: [id number]   HPI: Brian Roach is a 60 y.o. male presenting for done something to my back. Pain lower left with cramps all the way across the lower back to the left. HAs been picking up wood and putting it on the splitter. Onset a few days ago. No relief with heat or ice. Tylenol not effective. Does not radiate. Can hardly walk. 8/10.   Depression screen [facility] 2/9 01/16/2020 10/17/2019 07/18/2019 04/17/2019 01/10/2019  Decreased Interest 0 0 0 0 0  Down, Depressed, Hopeless 0 0 0 0 0  PHQ - 2 Score 0 0 0 0 0     Relevant past medical, surgical, family and social history reviewed and updated as indicated.  Interim medical history since our last visit reviewed. Allergies and medications reviewed and updated.  ROS:  Review of Systems  Constitutional: Negative for chills, diaphoresis and fever.  HENT: Negative for sore throat.   Respiratory: Negative for shortness of breath.   Cardiovascular: Negative for chest pain.  Gastrointestinal: Negative for abdominal pain.  Genitourinary: Negative for dysuria.  Musculoskeletal: Positive for arthralgias, back pain, gait problem and myalgias. Negative for neck pain.  Skin: Negative for rash.  Neurological: Positive for weakness. Negative for numbness.     Social History   Tobacco Use  Smoking Status Never Smoker  Smokeless Tobacco Never Used       Objective:     Wt Readings from Last 3 Encounters:  01/16/20 290 lb (131.5 kg)  10/17/19 283 lb (128.4 kg)  07/18/19 270 lb 8 oz (122.7 kg)     Exam deferred. Pt. Harboring due to COVID 19. Phone visit performed.   Assessment & Plan:   1. Lumbar pain     Meds ordered this encounter  Medications  . predniSONE (DELTASONE) 10 MG tablet    Sig: Take 5 daily for 3 days followed by 4,3,2 and 1 for 3 days each.    Dispense:  45 tablet    Refill:  0    No orders of the defined types were placed  in this encounter.     Diagnoses and all orders for this visit:  Lumbar pain  Other orders -     predniSONE (DELTASONE) 10 MG tablet; Take 5 daily for 3 days followed by 4,3,2 and 1 for 3 days each.    Virtual Visit via telephone Note  I discussed the limitations, risks, security and privacy concerns of performing an evaluation and management service by telephone and the availability of in person appointments. The patient was identified with two identifiers. Pt.expressed understanding and agreed to proceed. Pt. Is at home. [name] is in his office.  Follow Up Instructions:   I discussed the assessment and treatment plan with the patient. The patient was provided an opportunity to ask questions and all were answered. The patient agreed with the plan and demonstrated an understanding of the instructions.   The patient was advised to call back or seek an in-person evaluation if the symptoms worsen or if the condition fails to improve as anticipated.   Total minutes including chart review and phone contact time: 17   Follow up plan: No follow-ups on file.  [name], MD [place] [facility]

## 2020-04-18 ENCOUNTER — Ambulatory Visit: Payer: BC Managed Care – PPO | Admitting: Family Medicine

## 2020-04-18 ENCOUNTER — Encounter: Payer: Self-pay | Admitting: Family Medicine

## 2020-04-18 ENCOUNTER — Other Ambulatory Visit: Payer: Self-pay

## 2020-04-18 VITALS — BP 131/80 | HR 80 | Temp 98.0°F | Resp 20 | Ht 70.0 in | Wt 288.2 lb

## 2020-04-18 DIAGNOSIS — E1165 Type 2 diabetes mellitus with hyperglycemia: Secondary | ICD-10-CM | POA: Diagnosis not present

## 2020-04-18 DIAGNOSIS — J0110 Acute frontal sinusitis, unspecified: Secondary | ICD-10-CM

## 2020-04-18 DIAGNOSIS — J3089 Other allergic rhinitis: Secondary | ICD-10-CM

## 2020-04-18 DIAGNOSIS — K219 Gastro-esophageal reflux disease without esophagitis: Secondary | ICD-10-CM

## 2020-04-18 DIAGNOSIS — G4701 Insomnia due to medical condition: Secondary | ICD-10-CM | POA: Diagnosis not present

## 2020-04-18 DIAGNOSIS — I471 Supraventricular tachycardia: Secondary | ICD-10-CM

## 2020-04-18 DIAGNOSIS — K580 Irritable bowel syndrome with diarrhea: Secondary | ICD-10-CM

## 2020-04-18 DIAGNOSIS — E7800 Pure hypercholesterolemia, unspecified: Secondary | ICD-10-CM

## 2020-04-18 DIAGNOSIS — I10 Essential (primary) hypertension: Secondary | ICD-10-CM

## 2020-04-18 LAB — BAYER DCA HB A1C WAIVED: HB A1C (BAYER DCA - WAIVED): 7.2 % — ABNORMAL HIGH (ref <7.0)

## 2020-04-18 MED ORDER — OMEPRAZOLE 20 MG PO CPDR: 20.0000 mg | DELAYED_RELEASE_CAPSULE | Freq: Every day | ORAL | 1 refills | Status: DC

## 2020-04-18 MED ORDER — OZEMPIC (0.25 OR 0.5 MG/DOSE) 2 MG/1.5ML ~~LOC~~ SOPN: 0.5000 mg | PEN_INJECTOR | SUBCUTANEOUS | 2 refills | Status: DC

## 2020-04-18 MED ORDER — CETIRIZINE HCL 10 MG PO TABS: 10.0000 mg | ORAL_TABLET | Freq: Every day | ORAL | 3 refills | Status: DC

## 2020-04-18 MED ORDER — RIVAROXABAN 20 MG PO TABS: 20.0000 mg | ORAL_TABLET | Freq: Every day | ORAL | 2 refills | Status: DC

## 2020-04-18 MED ORDER — DILTIAZEM HCL ER COATED BEADS 360 MG PO CP24: ORAL_CAPSULE | ORAL | 1 refills | Status: DC

## 2020-04-18 MED ORDER — TRAZODONE HCL 300 MG PO TABS: ORAL_TABLET | ORAL | 1 refills | Status: DC

## 2020-04-18 MED ORDER — METFORMIN HCL ER 750 MG PO TB24
ORAL_TABLET | ORAL | 1 refills | Status: DC
Start: 2020-04-18 — End: 2020-11-10

## 2020-04-18 MED ORDER — ATORVASTATIN CALCIUM 20 MG PO TABS
ORAL_TABLET | ORAL | 1 refills | Status: DC
Start: 2020-04-18 — End: 2020-11-12

## 2020-04-18 MED ORDER — LINACLOTIDE 290 MCG PO CAPS: ORAL_CAPSULE | ORAL | 1 refills | Status: DC

## 2020-04-18 MED ORDER — FLUTICASONE PROPIONATE 50 MCG/ACT NA SUSP: 2.0000 | Freq: Every day | NASAL | 6 refills | Status: DC

## 2020-04-18 MED ORDER — BENAZEPRIL HCL 20 MG PO TABS: 20.0000 mg | ORAL_TABLET | Freq: Every day | ORAL | 1 refills | Status: DC

## 2020-04-18 NOTE — Progress Notes (Signed)
Subjective:  Patient ID: Brian Roach,  male    DOB: 12-07-1960  Age: 60 y.o.    CC: Medical Management of Chronic Issues   HPI SHAURYA RAWDON presents for  follow-up of hypertension. Patient has no history of headache chest pain or shortness of breath or recent cough. Patient also denies symptoms of TIA such as numbness weakness lateralizing. Patient denies side effects from medication. States taking it regularly.  Patient also  in for follow-up of elevated cholesterol. Doing well without complaints on current medication. Denies side effects  including myalgia and arthralgia and nausea. Also in today for liver function testing. Currently no chest pain, shortness of breath or other cardiovascular related symptoms noted.  Follow-up of diabetes. Patient does check blood sugar at home. No excessive highs or lows.  Patient denies symptoms such as excessive hunger or urinary frequency, excessive hunger, nausea No significant hypoglycemic spells noted. Medications reviewed. Pt reports taking them regularly. Pt. denies complication/adverse reaction today.    History Avyaan has a past medical history of Diabetes mellitus without complication ([place]), Hyperlipidemia, Hypertension, and PE (pulmonary embolism).   He has a past surgical history that includes Back surgery; Knee surgery; Lung surgery; Anterior cruciate ligament repair (Right); Wisdom tooth extraction; and Quadriceps tendon repair (Left, 09/23/2016).   His family history includes Heart disease in his brother and father.He reports that he has never smoked. He has never used smokeless tobacco. He reports that he does not drink alcohol and does not use drugs.  Current Outpatient Medications on File Prior to Visit  Medication Sig Dispense Refill  . cyclobenzaprine (FLEXERIL) 10 MG tablet Take 1 tablet (10 mg total) by mouth 3 (three) times daily as needed for muscle spasms. 90 tablet 5  . Multiple Vitamins-Minerals (ONE-A-DAY MENS  HEALTH FORMULA PO) Take 1 tablet by mouth daily.    . pioglitazone (ACTOS) 30 MG tablet Take 1 tablet (30 mg total) by mouth daily. 90 tablet 1   No current facility-administered medications on file prior to visit.    ROS Review of Systems  Constitutional: Negative for fever.  Respiratory: Negative for shortness of breath.   Cardiovascular: Negative for chest pain.  Musculoskeletal: Negative for arthralgias.  Skin: Negative for rash.    Objective:  BP 131/80   Pulse 80   Temp 98 F (36.7 C) (Temporal)   Resp 20   Ht $R'5\' 10"'Sr$  (1.778 m)   Wt 288 lb 4 oz (130.7 kg)   SpO2 96%   BMI 41.36 kg/m   BP Readings from Last 3 Encounters:  04/18/20 131/80  01/16/20 130/71  10/17/19 124/79    Wt Readings from Last 3 Encounters:  04/18/20 288 lb 4 oz (130.7 kg)  01/16/20 290 lb (131.5 kg)  10/17/19 283 lb (128.4 kg)     Physical Exam Vitals reviewed.  Constitutional:      Appearance: He is well-developed and well-nourished.  HENT:     Head: Normocephalic and atraumatic.     Right Ear: Tympanic membrane and external ear normal. No decreased hearing noted.     Left Ear: Tympanic membrane and external ear normal. No decreased hearing noted.     Mouth/Throat:     Pharynx: No oropharyngeal exudate or posterior oropharyngeal erythema.  Eyes:     Pupils: Pupils are equal, round, and reactive to light.  Cardiovascular:     Rate and Rhythm: Normal rate and regular rhythm.     Heart sounds: No murmur heard.   Pulmonary:  Effort: No respiratory distress.     Breath sounds: Normal breath sounds.  Abdominal:     General: Bowel sounds are normal.     Palpations: Abdomen is soft. There is no mass.     Tenderness: There is no abdominal tenderness.  Musculoskeletal:     Cervical back: Normal range of motion and neck supple.     Diabetic Foot Exam - Simple   No data filed       Assessment & Plan:   Dominick was seen today for medical management of chronic  issues.  Diagnoses and all orders for this visit:  HTN (hypertension), benign -     CBC with Differential/Platelet -     CMP14+EGFR -     Lipid panel -     diltiazem (CARDIZEM CD) 360 MG 24 hr capsule; TAKE 1 CAPSULE BY MOUTH EVERY DAY -     benazepril (LOTENSIN) 20 MG tablet; Take 1 tablet (20 mg total) by mouth daily.  Hypercholesteremia -     CBC with Differential/Platelet -     CMP14+EGFR -     Lipid panel -     atorvastatin (LIPITOR) 20 MG tablet; TAKE 1 TABLET BY MOUTH EVERY DAY IN THE EVENING  Uncontrolled type 2 diabetes mellitus with hyperglycemia (HCC) -     Microalbumin / creatinine urine ratio -     Bayer DCA Hb A1c Waived -     CBC with Differential/Platelet -     CMP14+EGFR -     Lipid panel -     metFORMIN (GLUCOPHAGE-XR) 750 MG 24 hr tablet; TAKE 1 TABLET BY MOUTH With Breakfast and supper -     benazepril (LOTENSIN) 20 MG tablet; Take 1 tablet (20 mg total) by mouth daily. -     atorvastatin (LIPITOR) 20 MG tablet; TAKE 1 TABLET BY MOUTH EVERY DAY IN THE EVENING  Insomnia due to medical condition -     trazodone (DESYREL) 300 MG tablet; TAKE 1 TABLET BY MOUTH EVERYDAY AT BEDTIME  Non-seasonal allergic rhinitis, unspecified trigger  Gastroesophageal reflux disease without esophagitis -     omeprazole (PRILOSEC) 20 MG capsule; Take 1 capsule (20 mg total) by mouth daily.  Irritable bowel syndrome with diarrhea -     linaclotide (LINZESS) 290 MCG CAPS capsule; TAKE 1 CAPSULE (290 MCG TOTAL) BY MOUTH DAILY. TO REGULATE BOWEL MOVEMENTS  Acute non-recurrent frontal sinusitis -     fluticasone (FLONASE) 50 MCG/ACT nasal spray; Place 2 sprays into both nostrils daily.  Paroxysmal SVT (supraventricular tachycardia) (HCC) -     diltiazem (CARDIZEM CD) 360 MG 24 hr capsule; TAKE 1 CAPSULE BY MOUTH EVERY DAY  Other orders -     cetirizine (ZYRTEC) 10 MG tablet; Take 1 tablet (10 mg total) by mouth daily. For allergy symptoms -     rivaroxaban (XARELTO) 20 MG TABS  tablet; Take 1 tablet (20 mg total) by mouth daily with supper. -     Semaglutide,0.25 or 0.5MG /DOS, (OZEMPIC, 0.25 OR 0.5 MG/DOSE,) 2 MG/1.5ML SOPN; Inject 0.5 mg into the skin once a week.   I have discontinued [name]'s apixaban, sitaGLIPtin, and predniSONE. I am also having him start on cetirizine, rivaroxaban, and Ozempic (0.25 or 0.5 MG/DOSE). Additionally, I am having him maintain his Multiple Vitamins-Minerals (ONE-A-DAY MENS HEALTH FORMULA PO), cyclobenzaprine, pioglitazone, trazodone, omeprazole, metFORMIN, linaclotide, fluticasone, diltiazem, benazepril, and atorvastatin.  Meds ordered this encounter  Medications  . trazodone (DESYREL) 300 MG tablet    Sig:  TAKE 1 TABLET BY MOUTH EVERYDAY AT BEDTIME    Dispense:  90 tablet    Refill:  1  . omeprazole (PRILOSEC) 20 MG capsule    Sig: Take 1 capsule (20 mg total) by mouth daily.    Dispense:  90 capsule    Refill:  1  . metFORMIN (GLUCOPHAGE-XR) 750 MG 24 hr tablet    Sig: TAKE 1 TABLET BY MOUTH With Breakfast and supper    Dispense:  180 tablet    Refill:  1  . linaclotide (LINZESS) 290 MCG CAPS capsule    Sig: TAKE 1 CAPSULE (290 MCG TOTAL) BY MOUTH DAILY. TO REGULATE BOWEL MOVEMENTS    Dispense:  90 capsule    Refill:  1  . fluticasone (FLONASE) 50 MCG/ACT nasal spray    Sig: Place 2 sprays into both nostrils daily.    Dispense:  16 g    Refill:  6  . diltiazem (CARDIZEM CD) 360 MG 24 hr capsule    Sig: TAKE 1 CAPSULE BY MOUTH EVERY DAY    Dispense:  90 capsule    Refill:  1  . benazepril (LOTENSIN) 20 MG tablet    Sig: Take 1 tablet (20 mg total) by mouth daily.    Dispense:  90 tablet    Refill:  1  . atorvastatin (LIPITOR) 20 MG tablet    Sig: TAKE 1 TABLET BY MOUTH EVERY DAY IN THE EVENING    Dispense:  90 tablet    Refill:  1  . cetirizine (ZYRTEC) 10 MG tablet    Sig: Take 1 tablet (10 mg total) by mouth daily. For allergy symptoms    Dispense:  90 tablet    Refill:  3  . rivaroxaban  (XARELTO) 20 MG TABS tablet    Sig: Take 1 tablet (20 mg total) by mouth daily with supper.    Dispense:  30 tablet    Refill:  2  . Semaglutide,0.25 or 0.5MG /DOS, (OZEMPIC, 0.25 OR 0.5 MG/DOSE,) 2 MG/1.5ML SOPN    Sig: Inject 0.5 mg into the skin once a week.    Dispense:  1.5 mL    Refill:  2     Follow-up: Return in about 3 months (around 07/16/2020).  Claretta Fraise, M.D.

## 2020-04-19 LAB — CMP14+EGFR
ALT: 39 IU/L (ref 0–44)
AST: 32 IU/L (ref 0–40)
Albumin/Globulin Ratio: 1.8 (ref 1.2–2.2)
Albumin: 4.4 g/dL (ref 3.8–4.8)
Alkaline Phosphatase: 63 IU/L (ref 44–121)
BUN/Creatinine Ratio: 13 (ref 10–24)
BUN: 13 mg/dL (ref 8–27)
Bilirubin Total: 0.5 mg/dL (ref 0.0–1.2)
CO2: 23 mmol/L (ref 20–29)
Calcium: 9.4 mg/dL (ref 8.6–10.2)
Chloride: 102 mmol/L (ref 96–106)
Creatinine, Ser: 0.97 mg/dL (ref 0.76–1.27)
GFR calc Af Amer: 97 mL/min/{1.73_m2} (ref >59)
GFR calc non Af Amer: 84 mL/min/{1.73_m2} (ref >59)
Globulin, Total: 2.5 g/dL (ref 1.5–4.5)
Glucose: 143 mg/dL — ABNORMAL HIGH (ref 65–99)
Potassium: 4.5 mmol/L (ref 3.5–5.2)
Sodium: 142 mmol/L (ref 134–144)
Total Protein: 6.9 g/dL (ref 6.0–8.5)

## 2020-04-19 LAB — CBC WITH DIFFERENTIAL/PLATELET
Basophils Absolute: 0.1 10*3/uL (ref 0.0–0.2)
Basos: 1 %
EOS (ABSOLUTE): 0.3 10*3/uL (ref 0.0–0.4)
Eos: 3 %
Hematocrit: 44.3 % (ref 37.5–51.0)
Hemoglobin: 14.6 g/dL (ref 13.0–17.7)
Immature Grans (Abs): 0.1 10*3/uL (ref 0.0–0.1)
Immature Granulocytes: 1 %
Lymphocytes Absolute: 2.2 10*3/uL (ref 0.7–3.1)
Lymphs: 28 %
MCH: 30.5 pg (ref 26.6–33.0)
MCHC: 33 g/dL (ref 31.5–35.7)
MCV: 93 fL (ref 79–97)
Monocytes Absolute: 0.7 10*3/uL (ref 0.1–0.9)
Monocytes: 8 %
Neutrophils Absolute: 4.7 10*3/uL (ref 1.4–7.0)
Neutrophils: 59 %
Platelets: 224 10*3/uL (ref 150–450)
RBC: 4.79 x10E6/uL (ref 4.14–5.80)
RDW: 13 % (ref 11.6–15.4)
WBC: 7.9 10*3/uL (ref 3.4–10.8)

## 2020-04-19 LAB — LIPID PANEL
Chol/HDL Ratio: 2.5 ratio (ref 0.0–5.0)
Cholesterol, Total: 108 mg/dL (ref 100–199)
HDL: 43 mg/dL (ref >39)
LDL Chol Calc (NIH): 46 mg/dL (ref 0–99)
Triglycerides: 104 mg/dL (ref 0–149)
VLDL Cholesterol Cal: 19 mg/dL (ref 5–40)

## 2020-04-19 LAB — MICROALBUMIN / CREATININE URINE RATIO
Creatinine, Urine: 172.1 mg/dL
Microalb/Creat Ratio: 42 mg/g creat — ABNORMAL HIGH (ref 0–29)
Microalbumin, Urine: 73 ug/mL

## 2020-05-19 ENCOUNTER — Other Ambulatory Visit: Payer: Self-pay

## 2020-05-19 ENCOUNTER — Encounter: Payer: Self-pay | Admitting: Family Medicine

## 2020-05-19 ENCOUNTER — Ambulatory Visit: Payer: BC Managed Care – PPO | Admitting: Family Medicine

## 2020-05-19 VITALS — BP 127/74 | HR 95 | Temp 99.4°F | Ht 70.0 in | Wt 280.0 lb

## 2020-05-19 DIAGNOSIS — E1165 Type 2 diabetes mellitus with hyperglycemia: Secondary | ICD-10-CM | POA: Diagnosis not present

## 2020-05-19 NOTE — Progress Notes (Signed)
Subjective:  Patient ID: Brian Roach, male    DOB: 03-20-1960  Age: 60 y.o. MRN: [id number]  CC: Diabetes   HPI Brian Roach presents forFollow-up of diabetes. Patient checks blood sugar at home.   110 and 150 postprandial Appetite has fallen off a lot, and Brian Roach feels Brian Roach has lost weight.  No significant hypoglycemic spells noted. Taking 0.25 mcg weekly Medications reviewed. Pt reports taking them regularly without complication/adverse reaction being reported today.    History Brian Roach has a past medical history of Diabetes mellitus without complication (HCC), Hyperlipidemia, Hypertension, and PE (pulmonary embolism).   Brian Roach has a past surgical history that includes Back surgery; Knee surgery; Lung surgery; Anterior cruciate ligament repair (Right); Wisdom tooth extraction; and Quadriceps tendon repair (Left, 09/23/2016).   His family history includes Heart disease in his brother and father.Brian Roach reports that Brian Roach has never smoked. Brian Roach has never used smokeless tobacco. Brian Roach reports that Brian Roach does not drink alcohol and does not use drugs.  Current Outpatient Medications on File Prior to Visit  Medication Sig Dispense Refill  . atorvastatin (LIPITOR) 20 MG tablet TAKE 1 TABLET BY MOUTH EVERY DAY IN THE EVENING 90 tablet 1  . benazepril (LOTENSIN) 20 MG tablet Take 1 tablet (20 mg total) by mouth daily. 90 tablet 1  . cetirizine (ZYRTEC) 10 MG tablet Take 1 tablet (10 mg total) by mouth daily. For allergy symptoms 90 tablet 3  . cyclobenzaprine (FLEXERIL) 10 MG tablet Take 1 tablet (10 mg total) by mouth 3 (three) times daily as needed for muscle spasms. 90 tablet 5  . diltiazem (CARDIZEM CD) 360 MG 24 hr capsule TAKE 1 CAPSULE BY MOUTH EVERY DAY 90 capsule 1  . fluticasone (FLONASE) 50 MCG/ACT nasal spray Place 2 sprays into both nostrils daily. 16 g 6  . linaclotide (LINZESS) 290 MCG CAPS capsule TAKE 1 CAPSULE (290 MCG TOTAL) BY MOUTH DAILY. TO REGULATE BOWEL MOVEMENTS 90 capsule 1  .  metFORMIN (GLUCOPHAGE-XR) 750 MG 24 hr tablet TAKE 1 TABLET BY MOUTH With Breakfast and supper 180 tablet 1  . Multiple Vitamins-Minerals (ONE-A-DAY MENS HEALTH FORMULA PO) Take 1 tablet by mouth daily.    Marland Kitchen omeprazole (PRILOSEC) 20 MG capsule Take 1 capsule (20 mg total) by mouth daily. 90 capsule 1  . pioglitazone (ACTOS) 30 MG tablet Take 1 tablet (30 mg total) by mouth daily. 90 tablet 1  . rivaroxaban (XARELTO) 20 MG TABS tablet Take 1 tablet (20 mg total) by mouth daily with supper. 30 tablet 2  . Semaglutide,0.25 or 0.5MG /DOS, (OZEMPIC, 0.25 OR 0.5 MG/DOSE,) 2 MG/1.5ML SOPN Inject 0.5 mg into the skin once a week. 1.5 mL 2  . trazodone (DESYREL) 300 MG tablet TAKE 1 TABLET BY MOUTH EVERYDAY AT BEDTIME 90 tablet 1   No current facility-administered medications on file prior to visit.    ROS Review of Systems  Constitutional: Negative for fever.  Respiratory: Negative for shortness of breath.   Cardiovascular: Negative for chest pain.  Gastrointestinal: Positive for nausea (mild, intermittent).  Musculoskeletal: Negative for arthralgias.  Skin: Negative for rash.    Objective:  BP 127/74   Pulse 95   Temp 99.4 F (37.4 C)   Ht 5\' 10"  (1.778 m)   Wt 280 lb (127 kg)   SpO2 95%   BMI 40.18 kg/m   BP Readings from Last 3 Encounters:  05/19/20 127/74  04/18/20 131/80  01/16/20 130/71    Wt Readings from Last 3 Encounters:  05/19/20  280 lb (127 kg)  04/18/20 288 lb 4 oz (130.7 kg)  01/16/20 290 lb (131.5 kg)     Physical Exam Vitals reviewed.  Constitutional:      Appearance: Brian Roach is well-developed.  HENT:     Head: Normocephalic and atraumatic.     Right Ear: External ear normal.     Left Ear: External ear normal.     Mouth/Throat:     Pharynx: No oropharyngeal exudate or posterior oropharyngeal erythema.  Eyes:     Pupils: Pupils are equal, round, and reactive to light.  Cardiovascular:     Rate and Rhythm: Normal rate and regular rhythm.     Heart sounds: No  murmur heard.   Pulmonary:     Effort: No respiratory distress.     Breath sounds: Normal breath sounds.  Musculoskeletal:     Cervical back: Normal range of motion and neck supple.  Neurological:     Mental Status: Brian Roach is alert and oriented to person, place, and time.       Assessment & Plan:   Brian Roach was seen today for diabetes.  Diagnoses and all orders for this visit:  Uncontrolled type 2 diabetes mellitus with hyperglycemia (HCC)   Patient's blood sugar is much better on the Ozempic.  Brian Roach is only checking once a day.  Brian Roach is also only taking the 0.25 dose.  I asked him to go ahead and increase to the full 0.5 mg.  Brian Roach should continue to monitor his blood sugars.  Postprandial is most important for this particular medication.  Patient follow-up in 2 months.   I am having Brian Buff "[name]" maintain his Multiple Vitamins-Minerals (ONE-A-DAY MENS HEALTH FORMULA PO), cyclobenzaprine, pioglitazone, trazodone, omeprazole, metFORMIN, linaclotide, fluticasone, diltiazem, benazepril, atorvastatin, cetirizine, rivaroxaban, and Ozempic (0.25 or 0.5 MG/DOSE).  No orders of the defined types were placed in this encounter.    Follow-up: Return in about 2 months (around 07/19/2020) for diabetes.  [name], M.D.

## 2020-07-04 ENCOUNTER — Other Ambulatory Visit: Payer: Self-pay | Admitting: Family Medicine

## 2020-07-04 DIAGNOSIS — J3089 Other allergic rhinitis: Secondary | ICD-10-CM

## 2020-07-04 DIAGNOSIS — E1165 Type 2 diabetes mellitus with hyperglycemia: Secondary | ICD-10-CM

## 2020-07-05 ENCOUNTER — Other Ambulatory Visit: Payer: Self-pay | Admitting: Family Medicine

## 2020-07-05 DIAGNOSIS — J0110 Acute frontal sinusitis, unspecified: Secondary | ICD-10-CM

## 2020-07-11 ENCOUNTER — Other Ambulatory Visit: Payer: Self-pay | Admitting: Family Medicine

## 2020-07-17 ENCOUNTER — Encounter: Payer: Self-pay | Admitting: Family Medicine

## 2020-07-17 ENCOUNTER — Ambulatory Visit: Payer: BC Managed Care – PPO | Admitting: Family Medicine

## 2020-07-17 ENCOUNTER — Other Ambulatory Visit: Payer: Self-pay

## 2020-07-17 VITALS — BP 125/72 | HR 75 | Temp 97.7°F | Ht 70.0 in | Wt 267.6 lb

## 2020-07-17 DIAGNOSIS — E7800 Pure hypercholesterolemia, unspecified: Secondary | ICD-10-CM

## 2020-07-17 DIAGNOSIS — E1165 Type 2 diabetes mellitus with hyperglycemia: Secondary | ICD-10-CM | POA: Diagnosis not present

## 2020-07-17 LAB — BAYER DCA HB A1C WAIVED: HB A1C (BAYER DCA - WAIVED): 7 % — ABNORMAL HIGH (ref <7.0)

## 2020-07-17 MED ORDER — ONDANSETRON HCL 4 MG PO TABS: 4.0000 mg | ORAL_TABLET | Freq: Three times a day (TID) | ORAL | 2 refills | Status: DC | PRN

## 2020-07-17 NOTE — Progress Notes (Signed)
Subjective:  Patient ID: Brian Roach, male    DOB: [birth year]  Age: 60 y.o. MRN: [id number]  CC: Medical Management of Chronic Issues   HPI Brian Roach presents forFollow-up of diabetes. Patient checks blood sugar at home.   100-140 fasting and  postprandial. Log available, reviwed Patient denies symptoms such as polyuria, polydipsia, excessive hunger, nausea No significant hypoglycemic spells noted. Medications reviewed. Pt reports taking them regularly without complication/adverse reaction being reported today.  Much less tihtness in abd from hernia due to weight loss.  Depression screen [facility] 2/9 07/17/2020 04/18/2020 01/16/2020 10/17/2019 07/18/2019  Decreased Interest 0 0 0 0 0  Down, Depressed, Hopeless 0 0 0 0 0  PHQ - 2 Score 0 0 0 0 0     History Brian Roach has a past medical history of Diabetes mellitus without complication (HCC), Hyperlipidemia, Hypertension, and PE (pulmonary embolism).   He has a past surgical history that includes Back surgery; Knee surgery; Lung surgery; Anterior cruciate ligament repair (Right); Wisdom tooth extraction; and Quadriceps tendon repair (Left, 09/23/2016).   His family history includes Heart disease in his brother and father.He reports that he has never smoked. He has never used smokeless tobacco. He reports that he does not drink alcohol and does not use drugs.  Current Outpatient Medications on File Prior to Visit  Medication Sig Dispense Refill  . atorvastatin (LIPITOR) 20 MG tablet TAKE 1 TABLET BY MOUTH EVERY DAY IN THE EVENING 90 tablet 1  . benazepril (LOTENSIN) 20 MG tablet Take 1 tablet (20 mg total) by mouth daily. 90 tablet 1  . cetirizine (ZYRTEC) 10 MG tablet Take 1 tablet (10 mg total) by mouth daily. For allergy symptoms 90 tablet 3  . cyclobenzaprine (FLEXERIL) 10 MG tablet Take 1 tablet (10 mg total) by mouth 3 (three) times daily as needed for muscle spasms. 90 tablet 5  . diltiazem (CARDIZEM CD) 360 MG 24 hr capsule  TAKE 1 CAPSULE BY MOUTH EVERY DAY 90 capsule 1  . fluticasone (FLONASE) 50 MCG/ACT nasal spray SPRAY 2 SPRAYS INTO EACH NOSTRIL EVERY DAY 48 mL 1  . linaclotide (LINZESS) 290 MCG CAPS capsule TAKE 1 CAPSULE (290 MCG TOTAL) BY MOUTH DAILY. TO REGULATE BOWEL MOVEMENTS 90 capsule 1  . metFORMIN (GLUCOPHAGE-XR) 750 MG 24 hr tablet TAKE 1 TABLET BY MOUTH With Breakfast and supper 180 tablet 1  . Multiple Vitamins-Minerals (ONE-A-DAY MENS HEALTH FORMULA PO) Take 1 tablet by mouth daily.    Marland Kitchen omeprazole (PRILOSEC) 20 MG capsule Take 1 capsule (20 mg total) by mouth daily. 90 capsule 1  . pioglitazone (ACTOS) 30 MG tablet TAKE 1 TABLET BY MOUTH EVERY DAY 90 tablet 0  . Semaglutide,0.25 or 0.5MG /DOS, (OZEMPIC, 0.25 OR 0.5 MG/DOSE,) 2 MG/1.5ML SOPN Inject 0.5 mg into the skin once a week. 1.5 mL 2  . trazodone (DESYREL) 300 MG tablet TAKE 1 TABLET BY MOUTH EVERYDAY AT BEDTIME 90 tablet 1  . XARELTO 20 MG TABS tablet TAKE 1 TABLET BY MOUTH DAILY WITH SUPPER. 30 tablet 0   No current facility-administered medications on file prior to visit.    ROS Review of Systems  Constitutional: Negative for fever.  Respiratory: Negative for shortness of breath.   Cardiovascular: Negative for chest pain.  Gastrointestinal: Positive for nausea.  Musculoskeletal: Negative for arthralgias.  Skin: Negative for rash.    Objective:  BP 125/72   Pulse 75   Temp 97.7 F (36.5 C)   Ht 5\' 10"  (1.778 m)  Wt 267 lb 9.6 oz (121.4 kg)   SpO2 96%   BMI 38.40 kg/m   BP Readings from Last 3 Encounters:  07/17/20 125/72  05/19/20 127/74  04/18/20 131/80    Wt Readings from Last 3 Encounters:  07/17/20 267 lb 9.6 oz (121.4 kg)  05/19/20 280 lb (127 kg)  04/18/20 288 lb 4 oz (130.7 kg)     Physical Exam Vitals reviewed.  Constitutional:      Appearance: He is well-developed.  HENT:     Head: Normocephalic and atraumatic.     Right Ear: Tympanic membrane and external ear normal. No decreased hearing noted.      Left Ear: Tympanic membrane and external ear normal. No decreased hearing noted.     Mouth/Throat:     Pharynx: No oropharyngeal exudate or posterior oropharyngeal erythema.  Eyes:     Pupils: Pupils are equal, round, and reactive to light.  Cardiovascular:     Rate and Rhythm: Normal rate and regular rhythm.     Heart sounds: No murmur heard.   Pulmonary:     Effort: No respiratory distress.     Breath sounds: Normal breath sounds.  Abdominal:     General: Bowel sounds are normal.     Palpations: Abdomen is soft. There is no mass.     Tenderness: There is no abdominal tenderness.  Musculoskeletal:     Cervical back: Normal range of motion and neck supple.       Assessment & Plan:   Brian Roach was seen today for medical management of chronic issues.  Diagnoses and all orders for this visit:  Uncontrolled type 2 diabetes mellitus with hyperglycemia (Brian Roach) -     Bayer DCA Hb A1c Waived -     CBC with Differential/Platelet -     CMP14+EGFR  Hypercholesteremia -     Lipid panel  Other orders -     ondansetron (ZOFRAN) 4 MG tablet; Take 1 tablet (4 mg total) by mouth every 8 (eight) hours as needed for nausea or vomiting.      I am having Brian Roach "Brian Roach" start on ondansetron. I am also having him maintain his Multiple Vitamins-Minerals (ONE-A-DAY MENS HEALTH FORMULA PO), cyclobenzaprine, trazodone, omeprazole, metFORMIN, linaclotide, diltiazem, benazepril, atorvastatin, cetirizine, Ozempic (0.25 or 0.5 MG/DOSE), pioglitazone, fluticasone, and Xarelto.  Meds ordered this encounter  Medications  . ondansetron (ZOFRAN) 4 MG tablet    Sig: Take 1 tablet (4 mg total) by mouth every 8 (eight) hours as needed for nausea or vomiting.    Dispense:  20 tablet    Refill:  2     Follow-up: Return in about 3 months (around 10/17/2020).  Claretta Fraise, M.D.

## 2020-07-18 LAB — CMP14+EGFR
ALT: 76 IU/L — ABNORMAL HIGH (ref 0–44)
AST: 54 IU/L — ABNORMAL HIGH (ref 0–40)
Albumin/Globulin Ratio: 2.1 (ref 1.2–2.2)
Albumin: 5 g/dL — ABNORMAL HIGH (ref 3.8–4.8)
Alkaline Phosphatase: 82 IU/L (ref 44–121)
BUN/Creatinine Ratio: 11 (ref 10–24)
BUN: 11 mg/dL (ref 8–27)
Bilirubin Total: 0.5 mg/dL (ref 0.0–1.2)
CO2: 21 mmol/L (ref 20–29)
Calcium: 9.7 mg/dL (ref 8.6–10.2)
Chloride: 98 mmol/L (ref 96–106)
Creatinine, Ser: 1.02 mg/dL (ref 0.76–1.27)
Globulin, Total: 2.4 g/dL (ref 1.5–4.5)
Glucose: 155 mg/dL — ABNORMAL HIGH (ref 65–99)
Potassium: 5 mmol/L (ref 3.5–5.2)
Sodium: 139 mmol/L (ref 134–144)
Total Protein: 7.4 g/dL (ref 6.0–8.5)
eGFR: 84 mL/min/{1.73_m2} (ref >59)

## 2020-07-18 LAB — CBC WITH DIFFERENTIAL/PLATELET
Basophils Absolute: 0.1 10*3/uL (ref 0.0–0.2)
Basos: 1 %
EOS (ABSOLUTE): 0.4 10*3/uL (ref 0.0–0.4)
Eos: 4 %
Hematocrit: 45.9 % (ref 37.5–51.0)
Hemoglobin: 15.4 g/dL (ref 13.0–17.7)
Immature Grans (Abs): 0 10*3/uL (ref 0.0–0.1)
Immature Granulocytes: 0 %
Lymphocytes Absolute: 2.6 10*3/uL (ref 0.7–3.1)
Lymphs: 25 %
MCH: 30.5 pg (ref 26.6–33.0)
MCHC: 33.6 g/dL (ref 31.5–35.7)
MCV: 91 fL (ref 79–97)
Monocytes Absolute: 1 10*3/uL — ABNORMAL HIGH (ref 0.1–0.9)
Monocytes: 9 %
Neutrophils Absolute: 6.3 10*3/uL (ref 1.4–7.0)
Neutrophils: 61 %
Platelets: 291 10*3/uL (ref 150–450)
RBC: 5.05 x10E6/uL (ref 4.14–5.80)
RDW: 13.1 % (ref 11.6–15.4)
WBC: 10.4 10*3/uL (ref 3.4–10.8)

## 2020-07-18 LAB — LIPID PANEL
Chol/HDL Ratio: 3.1 ratio (ref 0.0–5.0)
Cholesterol, Total: 101 mg/dL (ref 100–199)
HDL: 33 mg/dL — ABNORMAL LOW (ref >39)
LDL Chol Calc (NIH): 51 mg/dL (ref 0–99)
Triglycerides: 85 mg/dL (ref 0–149)
VLDL Cholesterol Cal: 17 mg/dL (ref 5–40)

## 2020-07-20 NOTE — Progress Notes (Signed)
Hello Earle,  Your lab result is normal and/or stable.Some minor variations that are not significant are commonly marked abnormal, but do not represent any medical problem for you.  Best regards, Emmanuelle Coxe, M.D.

## 2020-07-21 ENCOUNTER — Encounter: Payer: Self-pay | Admitting: Family Medicine

## 2020-07-21 ENCOUNTER — Other Ambulatory Visit: Payer: Self-pay | Admitting: Family Medicine

## 2020-07-21 DIAGNOSIS — Z1211 Encounter for screening for malignant neoplasm of colon: Secondary | ICD-10-CM

## 2020-08-08 ENCOUNTER — Encounter: Payer: Self-pay | Admitting: *Deleted

## 2020-08-13 ENCOUNTER — Other Ambulatory Visit: Payer: Self-pay | Admitting: Family Medicine

## 2020-09-03 ENCOUNTER — Other Ambulatory Visit: Payer: Self-pay | Admitting: Family Medicine

## 2020-09-30 ENCOUNTER — Other Ambulatory Visit: Payer: Self-pay | Admitting: Family Medicine

## 2020-10-02 ENCOUNTER — Other Ambulatory Visit: Payer: Self-pay | Admitting: Family Medicine

## 2020-10-11 ENCOUNTER — Other Ambulatory Visit: Payer: Self-pay | Admitting: Family Medicine

## 2020-10-11 DIAGNOSIS — K219 Gastro-esophageal reflux disease without esophagitis: Secondary | ICD-10-CM

## 2020-10-11 DIAGNOSIS — G4701 Insomnia due to medical condition: Secondary | ICD-10-CM

## 2020-10-14 ENCOUNTER — Ambulatory Visit: Payer: BC Managed Care – PPO | Admitting: Family Medicine

## 2020-10-22 ENCOUNTER — Ambulatory Visit: Payer: BC Managed Care – PPO | Admitting: Family Medicine

## 2020-11-01 ENCOUNTER — Other Ambulatory Visit: Payer: Self-pay | Admitting: Family Medicine

## 2020-11-01 DIAGNOSIS — K580 Irritable bowel syndrome with diarrhea: Secondary | ICD-10-CM

## 2020-11-08 ENCOUNTER — Other Ambulatory Visit: Payer: Self-pay | Admitting: Family Medicine

## 2020-11-08 DIAGNOSIS — E1165 Type 2 diabetes mellitus with hyperglycemia: Secondary | ICD-10-CM

## 2020-11-10 ENCOUNTER — Other Ambulatory Visit: Payer: Self-pay | Admitting: Family Medicine

## 2020-11-11 NOTE — Telephone Encounter (Signed)
Last visit 07/17/20 Upcoming appointment 11/12/20 Last refill 10/17/19, #90, 5 refills

## 2020-11-12 ENCOUNTER — Ambulatory Visit: Payer: BC Managed Care – PPO | Admitting: Family Medicine

## 2020-11-12 ENCOUNTER — Other Ambulatory Visit: Payer: Self-pay | Admitting: Family Medicine

## 2020-11-12 ENCOUNTER — Other Ambulatory Visit: Payer: Self-pay

## 2020-11-12 ENCOUNTER — Encounter: Payer: Self-pay | Admitting: Family Medicine

## 2020-11-12 VITALS — BP 125/63 | HR 84 | Temp 97.8°F | Ht 70.0 in | Wt 258.0 lb

## 2020-11-12 DIAGNOSIS — Z23 Encounter for immunization: Secondary | ICD-10-CM | POA: Diagnosis not present

## 2020-11-12 DIAGNOSIS — K580 Irritable bowel syndrome with diarrhea: Secondary | ICD-10-CM

## 2020-11-12 DIAGNOSIS — E1165 Type 2 diabetes mellitus with hyperglycemia: Secondary | ICD-10-CM

## 2020-11-12 DIAGNOSIS — I10 Essential (primary) hypertension: Secondary | ICD-10-CM | POA: Diagnosis not present

## 2020-11-12 DIAGNOSIS — E7800 Pure hypercholesterolemia, unspecified: Secondary | ICD-10-CM | POA: Diagnosis not present

## 2020-11-12 DIAGNOSIS — I471 Supraventricular tachycardia: Secondary | ICD-10-CM

## 2020-11-12 DIAGNOSIS — K219 Gastro-esophageal reflux disease without esophagitis: Secondary | ICD-10-CM

## 2020-11-12 DIAGNOSIS — G4701 Insomnia due to medical condition: Secondary | ICD-10-CM

## 2020-11-12 DIAGNOSIS — J3089 Other allergic rhinitis: Secondary | ICD-10-CM

## 2020-11-12 LAB — BAYER DCA HB A1C WAIVED: HB A1C (BAYER DCA - WAIVED): 6.6 % — ABNORMAL HIGH (ref 4.8–5.6)

## 2020-11-12 NOTE — Progress Notes (Signed)
Subjective:  Patient ID: Brian Roach, male    DOB: 03-25-1960  Age: 60 y.o. MRN: [id number]  CC: Medical Management of Chronic Issues   HPI Brian Roach presents forFollow-up of diabetes. Patient checked blood sugar at home for the last two weeks. .   110-125 fasting and 140-160 postprandial Patient denies symptoms such as polyuria, polydipsia, excessive hunger, nausea No significant hypoglycemic spells noted. Medications reviewed. Pt reports taking them regularly without complication/adverse reaction being reported today.    presents for  follow-up of hypertension. Patient has no history of headache chest pain or shortness of breath or recent cough. Patient also denies symptoms of TIA such as focal numbness or weakness. Patient denies side effects from medication. States taking it regularly.   in for follow-up of elevated cholesterol. Doing well without complaints on current medication. Denies side effects of statin including myalgia and arthralgia and nausea. Currently no chest pain, shortness of breath or other cardiovascular related symptoms noted.  Also has hx of PSVT no current sx. No chest pain.   History Brian Roach has a past medical history of Diabetes mellitus without complication ([place]), Hyperlipidemia, Hypertension, and PE (pulmonary embolism).   Brian Roach has a past surgical history that includes Back surgery; Knee surgery; Lung surgery; Anterior cruciate ligament repair (Right); Wisdom tooth extraction; and Quadriceps tendon repair (Left, 09/23/2016).   His family history includes Heart disease in his brother and father.Brian Roach reports that Brian Roach has never smoked. Brian Roach has never used smokeless tobacco. Brian Roach reports that Brian Roach does not drink alcohol and does not use drugs.  Current Outpatient Medications on File Prior to Visit  Medication Sig Dispense Refill   cetirizine (ZYRTEC) 10 MG tablet Take 1 tablet (10 mg total) by mouth daily. For allergy symptoms 90 tablet 3   cyclobenzaprine  (FLEXERIL) 10 MG tablet TAKE 1 TABLET BY MOUTH THREE TIMES A DAY AS NEEDED FOR MUSCLE SPASMS 90 tablet 5   fluticasone (FLONASE) 50 MCG/ACT nasal spray SPRAY 2 SPRAYS INTO EACH NOSTRIL EVERY DAY 48 mL 1   Multiple Vitamins-Minerals (ONE-A-DAY MENS HEALTH FORMULA PO) Take 1 tablet by mouth daily.     ondansetron (ZOFRAN) 4 MG tablet TAKE 1 TABLET BY MOUTH EVERY 8 HOURS AS NEEDED FOR NAUSEA AND VOMITING 18 tablet 3   No current facility-administered medications on file prior to visit.    ROS Review of Systems  Constitutional: Negative.  Negative for fever.  HENT: Negative.    Eyes:  Negative for visual disturbance.  Respiratory:  Negative for cough and shortness of breath.   Cardiovascular:  Negative for chest pain and leg swelling.  Gastrointestinal:  Positive for diarrhea (intermittent  with IBS currently no flare.). Negative for abdominal pain, nausea and vomiting.  Genitourinary:  Negative for difficulty urinating.  Musculoskeletal:  Negative for arthralgias and myalgias.  Skin:  Negative for rash.  Allergic/Immunologic: Positive for environmental allergies.  Neurological:  Negative for headaches.  Psychiatric/Behavioral:  Negative for sleep disturbance.    Objective:  BP 125/63   Pulse 84   Temp 97.8 F (36.6 C)   Ht _0  (1.778 m)   Wt 258 lb (117 kg)   SpO2 95%   BMI 37.02 kg/m   BP Readings from Last 3 Encounters:  11/12/20 125/63  07/17/20 125/72  05/19/20 127/74    Wt Readings from Last 3 Encounters:  11/12/20 258 lb (117 kg)  07/17/20 267 lb 9.6 oz (121.4 kg)  05/19/20 280 lb (127 kg)  Physical Exam Vitals reviewed.  Constitutional:      Appearance: Brian Roach is well-developed.  HENT:     Head: Normocephalic and atraumatic.     Right Ear: External ear normal.     Left Ear: External ear normal.     Mouth/Throat:     Pharynx: No oropharyngeal exudate or posterior oropharyngeal erythema.  Eyes:     Pupils: Pupils are equal, round, and reactive to light.   Cardiovascular:     Rate and Rhythm: Normal rate and regular rhythm.     Heart sounds: No murmur heard. Pulmonary:     Effort: No respiratory distress.     Breath sounds: Normal breath sounds.  Musculoskeletal:     Cervical back: Normal range of motion and neck supple.  Neurological:     Mental Status: Brian Roach is alert and oriented to person, place, and time.    Results for orders placed or performed in visit on 11/12/20  Bayer DCA Hb A1c Waived  Result Value Ref Range   HB A1C (BAYER DCA - WAIVED) 6.6 (H) 4.8 - 5.6 %  CBC with Differential/Platelet  Result Value Ref Range   WBC 12.7 (H) 3.4 - 10.8 x10E3/uL   RBC 5.34 4.14 - 5.80 x10E6/uL   Hemoglobin 15.6 13.0 - 17.7 g/dL   Hematocrit 48.8 37.5 - 51.0 %   MCV 91 79 - 97 fL   MCH 29.2 26.6 - 33.0 pg   MCHC 32.0 31.5 - 35.7 g/dL   RDW 12.8 11.6 - 15.4 %   Platelets 275 150 - 450 x10E3/uL   Neutrophils 69 Not Estab. %   Lymphs 20 Not Estab. %   Monocytes 8 Not Estab. %   Eos 2 Not Estab. %   Basos 1 Not Estab. %   Neutrophils Absolute 8.8 (H) 1.4 - 7.0 x10E3/uL   Lymphocytes Absolute 2.5 0.7 - 3.1 x10E3/uL   Monocytes Absolute 1.0 (H) 0.1 - 0.9 x10E3/uL   EOS (ABSOLUTE) 0.3 0.0 - 0.4 x10E3/uL   Basophils Absolute 0.1 0.0 - 0.2 x10E3/uL   Immature Granulocytes 0 Not Estab. %   Immature Grans (Abs) 0.0 0.0 - 0.1 x10E3/uL  CMP14+EGFR  Result Value Ref Range   Glucose 116 (H) 65 - 99 mg/dL   BUN 8 8 - 27 mg/dL   Creatinine, Ser 0.91 0.76 - 1.27 mg/dL   eGFR 95 >59 mL/min/1.73   BUN/Creatinine Ratio 9 (L) 10 - 24   Sodium 141 134 - 144 mmol/L   Potassium 4.7 3.5 - 5.2 mmol/L   Chloride 98 96 - 106 mmol/L   CO2 25 20 - 29 mmol/L   Calcium 9.8 8.6 - 10.2 mg/dL   Total Protein 7.3 6.0 - 8.5 g/dL   Albumin 4.7 3.8 - 4.8 g/dL   Globulin, Total 2.6 1.5 - 4.5 g/dL   Albumin/Globulin Ratio 1.8 1.2 - 2.2   Bilirubin Total 0.6 0.0 - 1.2 mg/dL   Alkaline Phosphatase 75 44 - 121 IU/L   AST 46 (H) 0 - 40 IU/L   ALT 60 (H) 0 - 44  IU/L  Lipid panel  Result Value Ref Range   Cholesterol, Total 101 100 - 199 mg/dL   Triglycerides 68 0 - 149 mg/dL   HDL 39 (L) >39 mg/dL   VLDL Cholesterol Cal 15 5 - 40 mg/dL   LDL Chol Calc (NIH) 47 0 - 99 mg/dL   Chol/HDL Ratio 2.6 0.0 - 5.0 ratio     Assessment & Plan:  Brian Roach was seen today for medical management of chronic issues.  Diagnoses and all orders for this visit:  Uncontrolled type 2 diabetes mellitus with hyperglycemia ([place]) -     Bayer DCA Hb A1c Waived -     CBC with Differential/Platelet -     atorvastatin (LIPITOR) 20 MG tablet; TAKE 1 TABLET BY MOUTH EVERY DAY IN THE EVENING -     benazepril (LOTENSIN) 20 MG tablet; Take 1 tablet (20 mg total) by mouth daily. -     metFORMIN (GLUCOPHAGE-XR) 750 MG 24 hr tablet; TAKE 1 TABLET BY MOUTH WITH BREAKFAST AND SUPPER -     pioglitazone (ACTOS) 30 MG tablet; Take 1 tablet (30 mg total) by mouth daily.  HTN (hypertension), benign -     CMP14+EGFR -     benazepril (LOTENSIN) 20 MG tablet; Take 1 tablet (20 mg total) by mouth daily. -     diltiazem (CARDIZEM CD) 360 MG 24 hr capsule; TAKE 1 CAPSULE BY MOUTH EVERY DAY  Hypercholesteremia -     Lipid panel -     atorvastatin (LIPITOR) 20 MG tablet; TAKE 1 TABLET BY MOUTH EVERY DAY IN THE EVENING  Paroxysmal SVT (supraventricular tachycardia) (HCC) -     diltiazem (CARDIZEM CD) 360 MG 24 hr capsule; TAKE 1 CAPSULE BY MOUTH EVERY DAY  Irritable bowel syndrome with diarrhea -     linaclotide (LINZESS) 290 MCG CAPS capsule; TAKE 1 CAPSULE (290 MCG TOTAL) BY MOUTH DAILY. TO REGULATE BOWEL MOVEMENTS  Gastroesophageal reflux disease without esophagitis -     omeprazole (PRILOSEC) 20 MG capsule; TAKE 1 CAPSULE BY MOUTH EVERY DAY  Insomnia due to medical condition -     trazodone (DESYREL) 300 MG tablet; TAKE 1 TABLET BY MOUTH EVERYDAY AT BEDTIME  Non-seasonal allergic rhinitis, unspecified trigger -     pioglitazone (ACTOS) 30 MG tablet; Take 1 tablet (30 mg total)  by mouth daily.  Need for immunization against influenza -     Flu Vaccine QUAD 72moIM (Fluarix, Fluzone & Alfiuria Quad PF)  Other orders -     rivaroxaban (XARELTO) 20 MG TABS tablet; Take 1 tablet (20 mg total) by mouth daily with supper. -     Semaglutide,0.25 or 0.5MG/DOS, (OZEMPIC, 0.25 OR 0.5 MG/DOSE,) 2 MG/1.5ML SOPN; Inject 0.5 mg into the skin once a week.     I have changed WCaron Presume"[name]"'s Linzess to linaclotide and Xarelto to rivaroxaban. I have also changed his pioglitazone and Ozempic (0.25 or 0.5 MG/DOSE). I am also having him maintain his Multiple Vitamins-Minerals (ONE-A-DAY MENS HEALTH FORMULA PO), cetirizine, fluticasone, ondansetron, cyclobenzaprine, atorvastatin, benazepril, diltiazem, metFORMIN, omeprazole, and trazodone.  Meds ordered this encounter  Medications   atorvastatin (LIPITOR) 20 MG tablet    Sig: TAKE 1 TABLET BY MOUTH EVERY DAY IN THE EVENING    Dispense:  90 tablet    Refill:  3   benazepril (LOTENSIN) 20 MG tablet    Sig: Take 1 tablet (20 mg total) by mouth daily.    Dispense:  90 tablet    Refill:  3   diltiazem (CARDIZEM CD) 360 MG 24 hr capsule    Sig: TAKE 1 CAPSULE BY MOUTH EVERY DAY    Dispense:  90 capsule    Refill:  3   linaclotide (LINZESS) 290 MCG CAPS capsule    Sig: TAKE 1 CAPSULE (290 MCG TOTAL) BY MOUTH DAILY. TO REGULATE BOWEL MOVEMENTS    Dispense:  90 capsule  Refill:  3   metFORMIN (GLUCOPHAGE-XR) 750 MG 24 hr tablet    Sig: TAKE 1 TABLET BY MOUTH WITH BREAKFAST AND SUPPER    Dispense:  180 tablet    Refill:  3   omeprazole (PRILOSEC) 20 MG capsule    Sig: TAKE 1 CAPSULE BY MOUTH EVERY DAY    Dispense:  90 capsule    Refill:  3   rivaroxaban (XARELTO) 20 MG TABS tablet    Sig: Take 1 tablet (20 mg total) by mouth daily with supper.    Dispense:  90 tablet    Refill:  3   trazodone (DESYREL) 300 MG tablet    Sig: TAKE 1 TABLET BY MOUTH EVERYDAY AT BEDTIME    Dispense:  90 tablet    Refill:  3    pioglitazone (ACTOS) 30 MG tablet    Sig: Take 1 tablet (30 mg total) by mouth daily.    Dispense:  90 tablet    Refill:  3   Semaglutide,0.25 or 0.5MG/DOS, (OZEMPIC, 0.25 OR 0.5 MG/DOSE,) 2 MG/1.5ML SOPN    Sig: Inject 0.5 mg into the skin once a week.    Dispense:  3 mL    Refill:  3     Follow-up: Return in about 3 months (around 02/11/2021).  Claretta Fraise, M.D.

## 2020-11-13 LAB — CBC WITH DIFFERENTIAL/PLATELET
Basophils Absolute: 0.1 10*3/uL (ref 0.0–0.2)
Basos: 1 %
EOS (ABSOLUTE): 0.3 10*3/uL (ref 0.0–0.4)
Eos: 2 %
Hematocrit: 48.8 % (ref 37.5–51.0)
Hemoglobin: 15.6 g/dL (ref 13.0–17.7)
Immature Grans (Abs): 0 10*3/uL (ref 0.0–0.1)
Immature Granulocytes: 0 %
Lymphocytes Absolute: 2.5 10*3/uL (ref 0.7–3.1)
Lymphs: 20 %
MCH: 29.2 pg (ref 26.6–33.0)
MCHC: 32 g/dL (ref 31.5–35.7)
MCV: 91 fL (ref 79–97)
Monocytes Absolute: 1 10*3/uL — ABNORMAL HIGH (ref 0.1–0.9)
Monocytes: 8 %
Neutrophils Absolute: 8.8 10*3/uL — ABNORMAL HIGH (ref 1.4–7.0)
Neutrophils: 69 %
Platelets: 275 10*3/uL (ref 150–450)
RBC: 5.34 x10E6/uL (ref 4.14–5.80)
RDW: 12.8 % (ref 11.6–15.4)
WBC: 12.7 10*3/uL — ABNORMAL HIGH (ref 3.4–10.8)

## 2020-11-13 LAB — CMP14+EGFR
ALT: 60 IU/L — ABNORMAL HIGH (ref 0–44)
AST: 46 IU/L — ABNORMAL HIGH (ref 0–40)
Albumin/Globulin Ratio: 1.8 (ref 1.2–2.2)
Albumin: 4.7 g/dL (ref 3.8–4.8)
Alkaline Phosphatase: 75 IU/L (ref 44–121)
BUN/Creatinine Ratio: 9 — ABNORMAL LOW (ref 10–24)
BUN: 8 mg/dL (ref 8–27)
Bilirubin Total: 0.6 mg/dL (ref 0.0–1.2)
CO2: 25 mmol/L (ref 20–29)
Calcium: 9.8 mg/dL (ref 8.6–10.2)
Chloride: 98 mmol/L (ref 96–106)
Creatinine, Ser: 0.91 mg/dL (ref 0.76–1.27)
Globulin, Total: 2.6 g/dL (ref 1.5–4.5)
Glucose: 116 mg/dL — ABNORMAL HIGH (ref 65–99)
Potassium: 4.7 mmol/L (ref 3.5–5.2)
Sodium: 141 mmol/L (ref 134–144)
Total Protein: 7.3 g/dL (ref 6.0–8.5)
eGFR: 95 mL/min/{1.73_m2} (ref >59)

## 2020-11-13 LAB — LIPID PANEL
Chol/HDL Ratio: 2.6 ratio (ref 0.0–5.0)
Cholesterol, Total: 101 mg/dL (ref 100–199)
HDL: 39 mg/dL — ABNORMAL LOW (ref >39)
LDL Chol Calc (NIH): 47 mg/dL (ref 0–99)
Triglycerides: 68 mg/dL (ref 0–149)
VLDL Cholesterol Cal: 15 mg/dL (ref 5–40)

## 2020-11-14 NOTE — Progress Notes (Signed)
Hello Brian Roach,  Your lab result is normal and/or stable.Some minor variations that are not significant are commonly marked abnormal, but do not represent any medical problem for you.  Best regards, Jahne Krukowski, M.D.

## 2020-11-16 ENCOUNTER — Encounter: Payer: Self-pay | Admitting: Family Medicine

## 2020-11-16 MED ORDER — OZEMPIC (0.25 OR 0.5 MG/DOSE) 2 MG/1.5ML ~~LOC~~ SOPN: 0.5000 mg | PEN_INJECTOR | SUBCUTANEOUS | 3 refills | Status: DC

## 2020-11-16 MED ORDER — METFORMIN HCL ER 750 MG PO TB24: ORAL_TABLET | ORAL | 3 refills | Status: DC

## 2020-11-16 MED ORDER — PIOGLITAZONE HCL 30 MG PO TABS: 30.0000 mg | ORAL_TABLET | Freq: Every day | ORAL | 3 refills | Status: DC

## 2020-11-16 MED ORDER — OMEPRAZOLE 20 MG PO CPDR: DELAYED_RELEASE_CAPSULE | ORAL | 3 refills | Status: DC

## 2020-11-16 MED ORDER — TRAZODONE HCL 300 MG PO TABS: ORAL_TABLET | ORAL | 3 refills | Status: DC

## 2020-11-16 MED ORDER — BENAZEPRIL HCL 20 MG PO TABS: 20.0000 mg | ORAL_TABLET | Freq: Every day | ORAL | 3 refills | Status: DC

## 2020-11-16 MED ORDER — LINACLOTIDE 290 MCG PO CAPS: ORAL_CAPSULE | ORAL | 3 refills | Status: DC

## 2020-11-16 MED ORDER — RIVAROXABAN 20 MG PO TABS: 20.0000 mg | ORAL_TABLET | Freq: Every day | ORAL | 3 refills | Status: DC

## 2020-11-16 MED ORDER — ATORVASTATIN CALCIUM 20 MG PO TABS: ORAL_TABLET | ORAL | 3 refills | Status: DC

## 2020-11-16 MED ORDER — DILTIAZEM HCL ER COATED BEADS 360 MG PO CP24: ORAL_CAPSULE | ORAL | 3 refills | Status: DC

## 2020-11-26 ENCOUNTER — Encounter: Payer: Self-pay | Admitting: Gastroenterology

## 2020-11-26 ENCOUNTER — Ambulatory Visit: Payer: BC Managed Care – PPO | Admitting: Gastroenterology

## 2020-11-26 ENCOUNTER — Other Ambulatory Visit: Payer: Self-pay

## 2020-11-26 VITALS — BP 137/83 | HR 65 | Temp 96.9°F | Ht 70.0 in | Wt 260.0 lb

## 2020-11-26 DIAGNOSIS — R7989 Other specified abnormal findings of blood chemistry: Secondary | ICD-10-CM | POA: Diagnosis not present

## 2020-11-26 DIAGNOSIS — Z7901 Long term (current) use of anticoagulants: Secondary | ICD-10-CM

## 2020-11-26 DIAGNOSIS — Z1211 Encounter for screening for malignant neoplasm of colon: Secondary | ICD-10-CM | POA: Diagnosis not present

## 2020-11-26 NOTE — Progress Notes (Signed)
Primary Care Physician:  [name], MD  Primary Gastroenterologist:  Roetta Sessions, MD   Chief Complaint  Patient presents with   Colonoscopy    HPI:  Brian Roach is a 60 y.o. male here at the request of [name] for consideration of screening colonoscopy.  Patient has never had a colonoscopy.  No family history of colon cancer.  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  His first PE occurred around 2005 after having ACL repair.  Patient states he required chest tubes at the time.  Patient underwent left quadriceps repair in July 2018 and on postop day 1 he was diagnosed with a pulmonary embolus.  Initially placed on Eliquis but transition to Xarelto because of insurance coverage.  Patient is not sure whether or not he was on a blood thinner prior to his second knee surgery.  He does not recall seeing a hematologist for work-up of blood clots.  No known family history of blood clotting disorder or colon cancer.  Patient is apprehensive about having a colonoscopy.  He is afraid he is going to get another blood clot.  He denies any bowel concerns.  He has chronic IBS, constipation well controlled on Linzess.  No blood in the stool or melena.  He states he is on omeprazole for history of hiatal hernia but no prior endoscopy.  Denies any heartburn or dysphagia.  Weight is down 30 pounds over the past year since starting Ozempic.  Patient has a history of mildly elevated AST/ALT.  Hepatitis C antibody negative in 2016.  No prior liver imaging in epic.  Component     Latest Ref Rng & Units 01/16/2020 04/18/2020 07/17/2020 11/12/2020  AST     0 - 40 IU/L 31 32 54 (H) 46 (H)  ALT     0 - 44 IU/L 47 (H) 39 76 (H) 60 (H)    Current Outpatient Medications  Medication Sig Dispense Refill   atorvastatin (LIPITOR) 20 MG tablet TAKE 1 TABLET BY MOUTH EVERY DAY IN THE EVENING 90 tablet 3   benazepril (LOTENSIN) 20 MG tablet Take 1 tablet (20 mg total) by mouth daily. 90  tablet 3   cetirizine (ZYRTEC) 10 MG tablet Take 1 tablet (10 mg total) by mouth daily. For allergy symptoms 90 tablet 3   cyclobenzaprine (FLEXERIL) 10 MG tablet TAKE 1 TABLET BY MOUTH THREE TIMES A DAY AS NEEDED FOR MUSCLE SPASMS 90 tablet 5   diltiazem (CARDIZEM CD) 360 MG 24 hr capsule TAKE 1 CAPSULE BY MOUTH EVERY DAY 90 capsule 3   fluticasone (FLONASE) 50 MCG/ACT nasal spray SPRAY 2 SPRAYS INTO EACH NOSTRIL EVERY DAY 48 mL 1   linaclotide (LINZESS) 290 MCG CAPS capsule TAKE 1 CAPSULE (290 MCG TOTAL) BY MOUTH DAILY. TO REGULATE BOWEL MOVEMENTS 90 capsule 3   metFORMIN (GLUCOPHAGE-XR) 750 MG 24 hr tablet TAKE 1 TABLET BY MOUTH WITH BREAKFAST AND SUPPER 180 tablet 3   Multiple Vitamins-Minerals (ONE-A-DAY MENS HEALTH FORMULA PO) Take 1 tablet by mouth daily.     omeprazole (PRILOSEC) 20 MG capsule TAKE 1 CAPSULE BY MOUTH EVERY DAY 90 capsule 3   rivaroxaban (XARELTO) 20 MG TABS tablet Take 1 tablet (20 mg total) by mouth daily with supper. 90 tablet 3   Semaglutide,0.25 or 0.5MG /DOS, (OZEMPIC, 0.25 OR 0.5 MG/DOSE,) 2 MG/1.5ML SOPN Inject 0.5 mg into the skin once a week. 3 mL 3   trazodone (DESYREL) 300 MG tablet TAKE 1 TABLET BY MOUTH EVERYDAY  AT BEDTIME 90 tablet 3   pioglitazone (ACTOS) 30 MG tablet Take 1 tablet (30 mg total) by mouth daily. 90 tablet 3   No current facility-administered medications for this visit.    Allergies as of 11/26/2020   (No Known Allergies)    Past Medical History:  Diagnosis Date   Diabetes mellitus without complication (HCC)    Hyperlipidemia    Hypertension    PE (pulmonary embolism)     Past Surgical History:  Procedure Laterality Date   ANTERIOR CRUCIATE LIGAMENT REPAIR Right    BACK SURGERY     KNEE SURGERY     LUNG SURGERY     Unspecified but was done for PE. Had chest tubes. Approx 2003   QUADRICEPS TENDON REPAIR Left 09/23/2016   Procedure: REPAIR QUADRICEP TENDON;  Surgeon: Vickki Hearing, MD;  Location: [facility];  Service:  Orthopedics;  Laterality: Left;   WISDOM TOOTH EXTRACTION      Family History  Problem Relation Age of Onset   Heart disease Father    Heart disease Brother     Social History   Socioeconomic History   Marital status: Divorced    Spouse name: Not on file   Number of children: Not on file   Years of education: Not on file   Highest education level: Not on file  Occupational History   Not on file  Tobacco Use   Smoking status: Never   Smokeless tobacco: Never  Vaping Use   Vaping Use: Never used  Substance and Sexual Activity   Alcohol use: No   Drug use: No   Sexual activity: Yes  Other Topics Concern   Not on file  Social History Narrative   Not on file   Social Determinants of Health   Financial Resource Strain: Not on file  Food Insecurity: Not on file  Transportation Needs: Not on file  Physical Activity: Not on file  Stress: Not on file  Social Connections: Not on file  Intimate Partner Violence: Not on file      ROS:  General: Negative for anorexia, weight loss, fever, chills, fatigue, weakness. Eyes: Negative for vision changes.  ENT: Negative for hoarseness, difficulty swallowing , nasal congestion. CV: Negative for chest pain, angina, palpitations, dyspnea on exertion, peripheral edema.  Respiratory: Negative for dyspnea at rest, dyspnea on exertion, cough, sputum, wheezing.  GI: See history of present illness. GU:  Negative for dysuria, hematuria, urinary incontinence, urinary frequency, nocturnal urination.  MS: Negative for joint pain, low back pain.  Derm: Negative for rash or itching.  Neuro: Negative for weakness, abnormal sensation, seizure, frequent headaches, memory loss, confusion.  Psych: Negative for anxiety, depression, suicidal ideation, hallucinations.  Endo: Negative for unusual weight change.  Heme: Negative for bruising or bleeding. Allergy: Negative for rash or hives.    Physical Examination:  BP 137/83   Pulse 65   Temp  (!) 96.9 F (36.1 C) (Temporal)   Ht 5\' 10"  (1.778 m)   Wt 260 lb (117.9 kg)   BMI 37.31 kg/m    General: Well-nourished, well-developed in no acute distress.  Head: Normocephalic, atraumatic.   Eyes: Conjunctiva pink, no icterus. Mouth: masked Neck: Supple without thyromegaly, masses, or lymphadenopathy.  Lungs: Clear to auscultation bilaterally.  Heart: Regular rate and rhythm, no murmurs rubs or gallops.  Abdomen: Bowel sounds are normal, nontender, nondistended, no hepatosplenomegaly or masses, no abdominal bruits or    hernia , no rebound or guarding.   Rectal: not  performed Extremities: No lower extremity edema. No clubbing or deformities.  Neuro: Alert and oriented x 4 , grossly normal neurologically.  Skin: Warm and dry, no rash or jaundice.   Psych: Alert and cooperative, normal mood and affect.  Labs: Lab Results  Component Value Date   CREATININE 0.91 11/12/2020   BUN 8 11/12/2020   NA 141 11/12/2020   K 4.7 11/12/2020   CL 98 11/12/2020   CO2 25 11/12/2020   Lab Results  Component Value Date   ALT 60 (H) 11/12/2020   AST 46 (H) 11/12/2020   ALKPHOS 75 11/12/2020   BILITOT 0.6 11/12/2020   Lab Results  Component Value Date   WBC 12.7 (H) 11/12/2020   HGB 15.6 11/12/2020   HCT 48.8 11/12/2020   MCV 91 11/12/2020   PLT 275 11/12/2020   Lab Results  Component Value Date   HGBA1C 6.6 (H) 11/12/2020     Imaging Studies: No results found.   Assessment:  Pleasant 60 year old male with history of diabetes, pulmonary emboli, chronically anticoagulated on Xarelto presents for consideration of screening colonoscopy.  No prior colonoscopy.  No family history of colon cancer.  History of chronic IBS-C well-controlled on Linzess.  No other GI concerns.  Patient has history of PEs occurring after surgery, last time in 2018, occurring on postop day 1.  To his knowledge he has never seen hematology to exclude clotting disorder.  He is very apprehensive about  pursuing screening colonoscopy, concerned he would develop another blood clot, especially if he has to hold Xarelto. It is possible that we could do screening colonoscopy on anticoagulation with caveat that if large polyp is present that he may have to come back for a second procedure off Xarelto in order to have it removed. Will discuss further with Dr. Jena Gauss.   He is noted to have intermittent elevation of AST/ALT as outlined. Followed by PCP. No prior u/s. Likely has fatty liver but would consider evaluation (screen for Hep B, iron/tibc/ferritin, abdominal u/s at minimum).    Plan: Consider abdominal u/s and additional labs if persistently elevated LFTs. Patient to discuss with PCP. Consider consultation with blood specialist to exclude clotting disorder. Patient to discuss with PCP. To discuss possible colonoscopy while on Xarelto with Dr. Jena Gauss. Further recommendations to follow.

## 2020-11-26 NOTE — Patient Instructions (Signed)
We will be in touch regarding next step ie possible colonoscopy on Xarelto versus Cologuard testing. Continue to follow up with PCP regarding abnormal liver blood work. If stay elevated, would consider further work up including labs and ultrasound.  Given history of two episodes of blood clots, consider seeing blood specialist to rule out clotting disorder. You can discuss further with your PCP.

## 2020-11-27 ENCOUNTER — Telehealth: Payer: Self-pay | Admitting: Gastroenterology

## 2020-11-27 NOTE — Telephone Encounter (Signed)
Lmom for pt to return my call.  

## 2020-11-27 NOTE — Telephone Encounter (Signed)
Spoke with pt and he is wanting to discuss with his wife and will call us back with what he would like to do.

## 2020-11-27 NOTE — Telephone Encounter (Signed)
Please let pt know I discussed with Dr. Jena Gauss regarding his history of blood clots and Xaretlo use.   He is willing to try colonoscopy on Xarelto to reduce risk of patient to have blood clot. Most small polyps can be removed at time of colonoscopy while on Xarelto but large ones may require bringing him back for a second colonoscopy off Xarelto (potentially lovenox bridge) to safely remove.   Please let me know if patient wants to pursue colonoscopy on Xarelto and I'll provide orders.

## 2020-11-27 NOTE — Telephone Encounter (Signed)
noted 

## 2020-12-08 ENCOUNTER — Other Ambulatory Visit: Payer: Self-pay | Admitting: Family Medicine

## 2021-01-18 ENCOUNTER — Other Ambulatory Visit: Payer: Self-pay | Admitting: Family Medicine

## 2021-02-04 ENCOUNTER — Encounter: Payer: Self-pay | Admitting: Family Medicine

## 2021-02-04 ENCOUNTER — Telehealth: Payer: Self-pay

## 2021-02-04 ENCOUNTER — Ambulatory Visit: Payer: BC Managed Care – PPO | Admitting: Family Medicine

## 2021-02-04 VITALS — BP 111/64 | HR 83 | Temp 98.1°F | Resp 20 | Ht 70.0 in | Wt 254.0 lb

## 2021-02-04 DIAGNOSIS — E1165 Type 2 diabetes mellitus with hyperglycemia: Secondary | ICD-10-CM | POA: Diagnosis not present

## 2021-02-04 DIAGNOSIS — I10 Essential (primary) hypertension: Secondary | ICD-10-CM | POA: Diagnosis not present

## 2021-02-04 DIAGNOSIS — I471 Supraventricular tachycardia: Secondary | ICD-10-CM

## 2021-02-04 DIAGNOSIS — E7800 Pure hypercholesterolemia, unspecified: Secondary | ICD-10-CM

## 2021-02-04 NOTE — Telephone Encounter (Signed)
Patient called the office today stating that he spoke with you regarding his Xarelto being held for a colonoscopy in the near future with Dr. Jena Gauss. Patient states that he was instructed by you to hold his Xarelto for five days before the colonoscopy and resuming the day after. Tana Coast NP wanted me to reach out to you and make sure that he is ok to come off of the Xarelto We would prefer to hold for two days before colonoscopy, but if you are concerned about risk of recurrent PE/DVT, as we discussed with the patient, we could try colonoscopy on Xarelto, but if large polyps needs to be removed he may require second procedure off Xarelto.

## 2021-02-04 NOTE — Progress Notes (Signed)
Subjective:  Patient ID: Brian Roach,  male    DOB: 1960-06-23  Age: 60 y.o.    CC: Medical Management of Chronic Issues (3 mo )   HPI Brian Roach presents for  follow-up of hypertension. Patient has no history of headache chest pain or shortness of breath or recent cough. Patient also denies symptoms of TIA such as numbness weakness lateralizing. Patient denies side effects from medication. States taking it regularly.  Patient also  in for follow-up of elevated cholesterol. Doing well without complaints on current medication. Denies side effects  including myalgia and arthralgia and nausea. Also in today for liver function testing. Currently no chest pain, shortness of breath or other cardiovascular related symptoms noted.  Follow-up of diabetes. Patient does check blood sugar at home. Readings run good. Patient denies symptoms such as excessive hunger or urinary frequency, excessive hunger, nausea No significant hypoglycemic spells noted. Medications reviewed. Pt reports taking them regularly. Pt. denies complication/adverse reaction today.    History Brian Roach has a past medical history of Diabetes mellitus without complication ([place]), Hyperlipidemia, Hypertension, and PE (pulmonary embolism).   He has a past surgical history that includes Back surgery; Knee surgery; Lung surgery; Anterior cruciate ligament repair (Right); Wisdom tooth extraction; and Quadriceps tendon repair (Left, 09/23/2016).   His family history includes Heart disease in his brother and father.He reports that he has never smoked. He has never used smokeless tobacco. He reports that he does not drink alcohol and does not use drugs.  Current Outpatient Medications on File Prior to Visit  Medication Sig Dispense Refill   atorvastatin (LIPITOR) 20 MG tablet TAKE 1 TABLET BY MOUTH EVERY DAY IN THE EVENING 90 tablet 3   benazepril (LOTENSIN) 20 MG tablet Take 1 tablet (20 mg total) by mouth daily. 90 tablet 3    cetirizine (ZYRTEC) 10 MG tablet Take 1 tablet (10 mg total) by mouth daily. For allergy symptoms 90 tablet 3   cyclobenzaprine (FLEXERIL) 10 MG tablet TAKE 1 TABLET BY MOUTH THREE TIMES A DAY AS NEEDED FOR MUSCLE SPASMS 90 tablet 5   diltiazem (CARDIZEM CD) 360 MG 24 hr capsule TAKE 1 CAPSULE BY MOUTH EVERY DAY 90 capsule 3   fluticasone (FLONASE) 50 MCG/ACT nasal spray SPRAY 2 SPRAYS INTO EACH NOSTRIL EVERY DAY 48 mL 1   linaclotide (LINZESS) 290 MCG CAPS capsule TAKE 1 CAPSULE (290 MCG TOTAL) BY MOUTH DAILY. TO REGULATE BOWEL MOVEMENTS 90 capsule 3   metFORMIN (GLUCOPHAGE-XR) 750 MG 24 hr tablet TAKE 1 TABLET BY MOUTH WITH BREAKFAST AND SUPPER 180 tablet 3   Multiple Vitamins-Minerals (ONE-A-DAY MENS HEALTH FORMULA PO) Take 1 tablet by mouth daily.     omeprazole (PRILOSEC) 20 MG capsule TAKE 1 CAPSULE BY MOUTH EVERY DAY 90 capsule 3   OZEMPIC, 0.25 OR 0.5 MG/DOSE, 2 MG/1.5ML SOPN INJECT 0.5 MG INTO THE SKIN ONCE A WEEK. 3 mL 0   rivaroxaban (XARELTO) 20 MG TABS tablet Take 1 tablet (20 mg total) by mouth daily with supper. 90 tablet 3   trazodone (DESYREL) 300 MG tablet TAKE 1 TABLET BY MOUTH EVERYDAY AT BEDTIME 90 tablet 3   ondansetron (ZOFRAN) 4 MG tablet TAKE 1 TABLET BY MOUTH EVERY 8 HOURS AS NEEDED FOR NAUSEA AND VOMITING (Patient not taking: Reported on 02/04/2021) 18 tablet 3   No current facility-administered medications on file prior to visit.    ROS Review of Systems  Constitutional:  Negative for fever.  Respiratory:  Negative for shortness  of breath.   Cardiovascular:  Negative for chest pain.  Musculoskeletal:  Negative for arthralgias.  Skin:  Negative for rash.   Objective:  BP 111/64   Pulse 83   Temp 98.1 F (36.7 C)   Resp 20   Ht 5' 10" (1.778 m)   Wt 254 lb (115.2 kg)   SpO2 96%   BMI 36.45 kg/m   BP Readings from Last 3 Encounters:  02/04/21 111/64  11/26/20 137/83  11/12/20 125/63    Wt Readings from Last 3 Encounters:  02/04/21 254 lb (115.2 kg)   11/26/20 260 lb (117.9 kg)  11/12/20 258 lb (117 kg)     Physical Exam Vitals reviewed.  Constitutional:      Appearance: He is well-developed.  HENT:     Head: Normocephalic and atraumatic.     Right Ear: External ear normal.     Left Ear: External ear normal.     Mouth/Throat:     Pharynx: No oropharyngeal exudate or posterior oropharyngeal erythema.  Eyes:     Pupils: Pupils are equal, round, and reactive to light.  Cardiovascular:     Rate and Rhythm: Normal rate and regular rhythm.     Heart sounds: No murmur heard. Pulmonary:     Effort: No respiratory distress.     Breath sounds: Normal breath sounds.  Musculoskeletal:     Cervical back: Normal range of motion and neck supple.  Neurological:     Mental Status: He is alert and oriented to person, place, and time.    Diabetic Foot Exam - Simple   Simple Foot Form Diabetic Foot exam was performed with the following findings: Yes 02/04/2021  9:20 AM  Visual Inspection No deformities, no ulcerations, no other skin breakdown bilaterally: Yes Sensation Testing Intact to touch and monofilament testing bilaterally: Yes Pulse Check Posterior Tibialis and Dorsalis pulse intact bilaterally: Yes Comments       Assessment & Plan:   Brian Roach was seen today for medical management of chronic issues.  Diagnoses and all orders for this visit:  Uncontrolled type 2 diabetes mellitus with hyperglycemia ([place]) -     Bayer DCA Hb A1c Waived; Future  HTN (hypertension), benign -     CMP14+EGFR; Future  Hypercholesteremia -     Lipid panel; Future  Paroxysmal SVT (supraventricular tachycardia) (HCC) -     CBC with Differential/Platelet; Future -     CMP14+EGFR; Future  I have discontinued Brian Presume "[name]"'s pioglitazone. I am also having him maintain his Multiple Vitamins-Minerals (ONE-A-DAY MENS HEALTH FORMULA PO), cetirizine, fluticasone, cyclobenzaprine, atorvastatin, benazepril, diltiazem, linaclotide,  metFORMIN, omeprazole, rivaroxaban, trazodone, Ozempic (0.25 or 0.5 MG/DOSE), and ondansetron.  No orders of the defined types were placed in this encounter.    Follow-up: Return in about 4 months (around 06/05/2021).  Claretta Fraise, M.D.

## 2021-02-04 NOTE — Telephone Encounter (Signed)
Pt called stating that he seen his PCP today and was told that he would be ok to stop the Xarelto 5 days before having a colonoscopy and restart the Xarelto the day after. Pt would like to go ahead with scheduling.

## 2021-02-04 NOTE — Telephone Encounter (Signed)
Message was sent to PCP regarding Xarelto.

## 2021-02-04 NOTE — Telephone Encounter (Signed)
[name]' note from today does not mention Xarelto discussion.   Can we get get written documentation from [name] regarding holding Xarelto. We would prefer to hold for two days before colonoscopy but if [name] is concerned about risk of recurrent PE/DVT, as discussed with the patient, we could try colonoscopy on Xarelto but if large polyps needs to be removed he may require second procedure off Xarelto.

## 2021-02-04 NOTE — Telephone Encounter (Signed)
Documentation from [name] noted, ok to hold Xarelto up to five days with minimal risk for clotting.    Ok to schedule screening colonoscopy with propfol, ASA 3. Originally was supposed to have procedure with Dr. Jena Gauss (patient has not seen either doc). He can wait to have done with Dr. Jena Gauss or have done with Dr. Marletta Lor if he prefers.   Hold ozempic for one week before. Hold Xarelto for 48 hours before.  Day of prep: take AM metformin only AM of TCS: hold metformin

## 2021-02-04 NOTE — Telephone Encounter (Signed)
See other note from PCP.

## 2021-02-05 NOTE — Telephone Encounter (Signed)
Called pt, he would like to wait until 1st of next year to do TCS. He is ok with Dr. Jena Gauss or Dr. Marletta Lor. He's aware we will call him when January schedule is available.

## 2021-02-12 NOTE — Telephone Encounter (Signed)
LMOVM for pt 

## 2021-02-13 ENCOUNTER — Other Ambulatory Visit: Payer: BC Managed Care – PPO

## 2021-02-13 DIAGNOSIS — E7800 Pure hypercholesterolemia, unspecified: Secondary | ICD-10-CM

## 2021-02-13 DIAGNOSIS — I471 Supraventricular tachycardia: Secondary | ICD-10-CM

## 2021-02-13 DIAGNOSIS — I10 Essential (primary) hypertension: Secondary | ICD-10-CM

## 2021-02-13 DIAGNOSIS — E1165 Type 2 diabetes mellitus with hyperglycemia: Secondary | ICD-10-CM

## 2021-02-13 LAB — BAYER DCA HB A1C WAIVED: HB A1C (BAYER DCA - WAIVED): 6.5 % — ABNORMAL HIGH (ref 4.8–5.6)

## 2021-02-14 LAB — CBC WITH DIFFERENTIAL/PLATELET
Basophils Absolute: 0.1 10*3/uL (ref 0.0–0.2)
Basos: 1 %
EOS (ABSOLUTE): 0.4 10*3/uL (ref 0.0–0.4)
Eos: 4 %
Hematocrit: 45.6 % (ref 37.5–51.0)
Hemoglobin: 14.9 g/dL (ref 13.0–17.7)
Immature Grans (Abs): 0 10*3/uL (ref 0.0–0.1)
Immature Granulocytes: 0 %
Lymphocytes Absolute: 2.6 10*3/uL (ref 0.7–3.1)
Lymphs: 28 %
MCH: 29.8 pg (ref 26.6–33.0)
MCHC: 32.7 g/dL (ref 31.5–35.7)
MCV: 91 fL (ref 79–97)
Monocytes Absolute: 0.9 10*3/uL (ref 0.1–0.9)
Monocytes: 9 %
Neutrophils Absolute: 5.5 10*3/uL (ref 1.4–7.0)
Neutrophils: 58 %
Platelets: 281 10*3/uL (ref 150–450)
RBC: 5 x10E6/uL (ref 4.14–5.80)
RDW: 12.5 % (ref 11.6–15.4)
WBC: 9.4 10*3/uL (ref 3.4–10.8)

## 2021-02-14 LAB — CMP14+EGFR
ALT: 33 IU/L (ref 0–44)
AST: 26 IU/L (ref 0–40)
Albumin/Globulin Ratio: 1.9 (ref 1.2–2.2)
Albumin: 4.6 g/dL (ref 3.8–4.8)
Alkaline Phosphatase: 71 IU/L (ref 44–121)
BUN/Creatinine Ratio: 8 — ABNORMAL LOW (ref 10–24)
BUN: 8 mg/dL (ref 8–27)
Bilirubin Total: 0.4 mg/dL (ref 0.0–1.2)
CO2: 23 mmol/L (ref 20–29)
Calcium: 9.2 mg/dL (ref 8.6–10.2)
Chloride: 103 mmol/L (ref 96–106)
Creatinine, Ser: 1.03 mg/dL (ref 0.76–1.27)
Globulin, Total: 2.4 g/dL (ref 1.5–4.5)
Glucose: 133 mg/dL — ABNORMAL HIGH (ref 70–99)
Potassium: 4.4 mmol/L (ref 3.5–5.2)
Sodium: 141 mmol/L (ref 134–144)
Total Protein: 7 g/dL (ref 6.0–8.5)
eGFR: 82 mL/min/{1.73_m2} (ref >59)

## 2021-02-14 LAB — LIPID PANEL
Chol/HDL Ratio: 2.4 ratio (ref 0.0–5.0)
Cholesterol, Total: 92 mg/dL — ABNORMAL LOW (ref 100–199)
HDL: 38 mg/dL — ABNORMAL LOW (ref >39)
LDL Chol Calc (NIH): 42 mg/dL (ref 0–99)
Triglycerides: 48 mg/dL (ref 0–149)
VLDL Cholesterol Cal: 12 mg/dL (ref 5–40)

## 2021-02-16 NOTE — Progress Notes (Signed)
Hello Suhas,  Your lab result is normal and/or stable.Some minor variations that are not significant are commonly marked abnormal, but do not represent any medical problem for you.  Best regards, Jozy Mcphearson, M.D.

## 2021-02-17 NOTE — Telephone Encounter (Signed)
Letter mailed

## 2021-03-19 ENCOUNTER — Encounter: Payer: Self-pay | Admitting: *Deleted

## 2021-03-19 MED ORDER — CLENPIQ 10-3.5-12 MG-GM -GM/160ML PO SOLN: 1.0000 | Freq: Once | ORAL | 0 refills | Status: AC

## 2021-03-19 NOTE — Addendum Note (Signed)
Addended by: Armstead Peaks on: 03/19/2021 02:58 PM   Modules accepted: Orders

## 2021-03-19 NOTE — Telephone Encounter (Signed)
Patient returned call. He wanted to schedule with Dr. Marletta Lor to go ahead and get procedure done. Scheduled for 2/20 at 7:30am. Aware will mail prep instructions. Sent rx to pharmacy. Will call with pre-op appt.

## 2021-04-02 ENCOUNTER — Other Ambulatory Visit: Payer: Self-pay | Admitting: Family Medicine

## 2021-04-15 NOTE — Patient Instructions (Signed)
Brian Roach  04/15/2021     @PREFPERIOPPHARMACY @   Your procedure is scheduled on  04/20/2021.   Report to [facility] at  [phone]  A.M.   Call this number if you have problems the morning of surgery:  [phone]   Remember:  Follow the diet and prep instructions given to you by the office.  Your last dose of ozempic should have been on 04/12/2021 and your last dose of xarelto should be on 04/14/2021.     Take these medicines the morning of surgery with A SIP OF WATER     flexeril(if needed), cardiazem, prilosec, zofran (if needed).     Do not wear jewelry, make-up or nail polish.  Do not wear lotions, powders, or perfumes, or deodorant.  Do not shave 48 hours prior to surgery.  Men may shave face and neck.  Do not bring valuables to the hospital.  [facility] is not responsible for any belongings or valuables.  Contacts, dentures or bridgework may not be worn into surgery.  Leave your suitcase in the car.  After surgery it may be brought to your room.  For patients admitted to the hospital, discharge time will be determined by your treatment team.  Patients discharged the day of surgery will not be allowed to drive home and must have someone with them for 24 hours.    Special instructions:   DO NOT smoke tobacco or vape for 24 hours before your procedure.  Please read over the following fact sheets that you were given. Anesthesia Post-op Instructions and Care and Recovery After Surgery      Colonoscopy, Adult, Care After This sheet gives you information about how to care for yourself after your procedure. Your health care provider may also give you more specific instructions. If you have problems or questions, contact your health care provider. What can I expect after the procedure? After the procedure, it is common to have: A small amount of blood in your stool for 24 hours after the procedure. Some gas. Mild cramping or bloating of your abdomen. Follow  these instructions at home: Eating and drinking  Drink enough fluid to keep your urine pale yellow. Follow instructions from your health care provider about eating or drinking restrictions. Resume your normal diet as instructed by your health care provider. Avoid heavy or fried foods that are hard to digest. Activity Rest as told by your health care provider. Avoid sitting for a long time without moving. Get up to take short walks every 1-2 hours. This is important to improve blood flow and breathing. Ask for help if you feel weak or unsteady. Return to your normal activities as told by your health care provider. Ask your health care provider what activities are safe for you. Managing cramping and bloating  Try walking around when you have cramps or feel bloated. Apply heat to your abdomen as told by your health care provider. Use the heat source that your health care provider recommends, such as a moist heat pack or a heating pad. Place a towel between your skin and the heat source. Leave the heat on for 20-30 minutes. Remove the heat if your skin turns bright red. This is especially important if you are unable to feel pain, heat, or cold. You may have a greater risk of getting burned. General instructions If you were given a sedative during the procedure, it can affect you for several hours. Do not drive or operate  machinery until your health care provider says that it is safe. For the first 24 hours after the procedure: Do not sign important documents. Do not drink alcohol. Do your regular daily activities at a slower pace than normal. Eat soft foods that are easy to digest. Take over-the-counter and prescription medicines only as told by your health care provider. Keep all follow-up visits as told by your health care provider. This is important. Contact a health care provider if: You have blood in your stool 2-3 days after the procedure. Get help right away if you have: More than a  small spotting of blood in your stool. Large blood clots in your stool. Swelling of your abdomen. Nausea or vomiting. A fever. Increasing pain in your abdomen that is not relieved with medicine. Summary After the procedure, it is common to have a small amount of blood in your stool. You may also have mild cramping and bloating of your abdomen. If you were given a sedative during the procedure, it can affect you for several hours. Do not drive or operate machinery until your health care provider says that it is safe. Get help right away if you have a lot of blood in your stool, nausea or vomiting, a fever, or increased pain in your abdomen. This information is not intended to replace advice given to you by your health care provider. Make sure you discuss any questions you have with your health care provider. Document Revised: 12/22/2018 Document Reviewed: 09/11/2018 Elsevier Patient Education  2022 [employer] Monitored Anesthesia Care, Care After This sheet gives you information about how to care for yourself after your procedure. Your health care provider may also give you more specific instructions. If you have problems or questions, contact your health care provider. What can I expect after the procedure? After the procedure, it is common to have: Tiredness. Forgetfulness about what happened after the procedure. Impaired judgment for important decisions. Nausea or vomiting. Some difficulty with balance. Follow these instructions at home: For the time period you were told by your health care provider:   Rest as needed. Do not participate in activities where you could fall or become injured. Do not drive or use machinery. Do not drink alcohol. Do not take sleeping pills or medicines that cause drowsiness. Do not make important decisions or sign legal documents. Do not take care of children on your own. Eating and drinking Follow the diet that is recommended by your health care  provider. Drink enough fluid to keep your urine pale yellow. If you vomit: Drink water, juice, or soup when you can drink without vomiting. Make sure you have little or no nausea before eating solid foods. General instructions Have a responsible adult stay with you for the time you are told. It is important to have someone help care for you until you are awake and alert. Take over-the-counter and prescription medicines only as told by your health care provider. If you have sleep apnea, surgery and certain medicines can increase your risk for breathing problems. Follow instructions from your health care provider about wearing your sleep device: Anytime you are sleeping, including during daytime naps. While taking prescription pain medicines, sleeping medicines, or medicines that make you drowsy. Avoid smoking. Keep all follow-up visits as told by your health care provider. This is important. Contact a health care provider if: You keep feeling nauseous or you keep vomiting. You feel light-headed. You are still sleepy or having trouble with balance after 24 hours. You develop  a rash. You have a fever. You have redness or swelling around the IV site. Get help right away if: You have trouble breathing. You have new-onset confusion at home. Summary For several hours after your procedure, you may feel tired. You may also be forgetful and have poor judgment. Have a responsible adult stay with you for the time you are told. It is important to have someone help care for you until you are awake and alert. Rest as told. Do not drive or operate machinery. Do not drink alcohol or take sleeping pills. Get help right away if you have trouble breathing, or if you suddenly become confused. This information is not intended to replace advice given to you by your health care provider. Make sure you discuss any questions you have with your health care provider. Document Revised: 11/01/2019 Document Reviewed:  01/18/2019 Elsevier Patient Education  2022 [employer].

## 2021-04-16 ENCOUNTER — Encounter (HOSPITAL_COMMUNITY): Payer: Self-pay

## 2021-04-16 ENCOUNTER — Encounter (HOSPITAL_COMMUNITY)
Admission: RE | Admit: 2021-04-16 | Discharge: 2021-04-16 | Disposition: A | Discharge status: Home / Self Care | Payer: BC Managed Care – PPO | Source: Ambulatory Visit | Attending: Internal Medicine | Admitting: Internal Medicine

## 2021-04-16 ENCOUNTER — Other Ambulatory Visit: Payer: Self-pay

## 2021-04-16 DIAGNOSIS — Z01810 Encounter for preprocedural cardiovascular examination: Secondary | ICD-10-CM | POA: Insufficient documentation

## 2021-04-20 ENCOUNTER — Ambulatory Visit (HOSPITAL_COMMUNITY): Payer: BC Managed Care – PPO | Admitting: Anesthesiology

## 2021-04-20 ENCOUNTER — Encounter (HOSPITAL_COMMUNITY)
Admission: RE | Disposition: A | Discharge status: Home / Self Care | Payer: Self-pay | Source: Home / Self Care | Attending: Internal Medicine

## 2021-04-20 ENCOUNTER — Encounter (HOSPITAL_COMMUNITY): Payer: Self-pay | Admitting: *Deleted

## 2021-04-20 ENCOUNTER — Ambulatory Visit (HOSPITAL_COMMUNITY)
Admission: RE | Admit: 2021-04-20 | Discharge: 2021-04-20 | Disposition: A | Discharge status: Home / Self Care | Payer: BC Managed Care – PPO | Attending: Internal Medicine | Admitting: Internal Medicine

## 2021-04-20 DIAGNOSIS — Z86711 Personal history of pulmonary embolism: Secondary | ICD-10-CM | POA: Insufficient documentation

## 2021-04-20 DIAGNOSIS — K219 Gastro-esophageal reflux disease without esophagitis: Secondary | ICD-10-CM | POA: Diagnosis not present

## 2021-04-20 DIAGNOSIS — Z1211 Encounter for screening for malignant neoplasm of colon: Secondary | ICD-10-CM | POA: Diagnosis not present

## 2021-04-20 DIAGNOSIS — Z7984 Long term (current) use of oral hypoglycemic drugs: Secondary | ICD-10-CM | POA: Diagnosis not present

## 2021-04-20 DIAGNOSIS — M1990 Unspecified osteoarthritis, unspecified site: Secondary | ICD-10-CM | POA: Diagnosis not present

## 2021-04-20 DIAGNOSIS — Z86718 Personal history of other venous thrombosis and embolism: Secondary | ICD-10-CM | POA: Diagnosis not present

## 2021-04-20 DIAGNOSIS — K5730 Diverticulosis of large intestine without perforation or abscess without bleeding: Secondary | ICD-10-CM | POA: Diagnosis not present

## 2021-04-20 DIAGNOSIS — I471 Supraventricular tachycardia: Secondary | ICD-10-CM | POA: Insufficient documentation

## 2021-04-20 DIAGNOSIS — E119 Type 2 diabetes mellitus without complications: Secondary | ICD-10-CM | POA: Insufficient documentation

## 2021-04-20 DIAGNOSIS — I10 Essential (primary) hypertension: Secondary | ICD-10-CM | POA: Diagnosis not present

## 2021-04-20 DIAGNOSIS — K648 Other hemorrhoids: Secondary | ICD-10-CM | POA: Insufficient documentation

## 2021-04-20 HISTORY — DX: Nausea with vomiting, unspecified: R11.2

## 2021-04-20 HISTORY — DX: Other specified postprocedural states: Z98.890

## 2021-04-20 HISTORY — PX: COLONOSCOPY WITH PROPOFOL: SHX5780

## 2021-04-20 LAB — GLUCOSE, CAPILLARY: Glucose-Capillary: 123 mg/dL — ABNORMAL HIGH (ref 70–99)

## 2021-04-20 SURGERY — COLONOSCOPY WITH PROPOFOL
Anesthesia: General

## 2021-04-20 MED ORDER — PROPOFOL 500 MG/50ML IV EMUL
INTRAVENOUS | Status: DC | PRN
Start: 2021-04-20 — End: 2021-04-20
  Administered 2021-04-20: 150 ug/kg/min via INTRAVENOUS

## 2021-04-20 MED ORDER — PROPOFOL 10 MG/ML IV BOLUS
INTRAVENOUS | Status: DC | PRN
  Administered 2021-04-20: 100 mg via INTRAVENOUS
  Administered 2021-04-20 (×2): 30 mg via INTRAVENOUS

## 2021-04-20 MED ORDER — LACTATED RINGERS IV SOLN: INTRAVENOUS | Status: DC

## 2021-04-20 MED ORDER — LIDOCAINE HCL (CARDIAC) PF 100 MG/5ML IV SOSY
PREFILLED_SYRINGE | INTRAVENOUS | Status: DC | PRN
  Administered 2021-04-20: 50 mg via INTRAVENOUS

## 2021-04-20 NOTE — Discharge Instructions (Addendum)
°  Colonoscopy Discharge Instructions  Read the instructions outlined below and refer to this sheet in the next few weeks. These discharge instructions provide you with general information on caring for yourself after you leave the hospital. Your doctor may also give you specific instructions. While your treatment has been planned according to the most current medical practices available, unavoidable complications occasionally occur.   ACTIVITY You may resume your regular activity, but move at a slower pace for the next 24 hours.  Take frequent rest periods for the next 24 hours.  Walking will help get rid of the air and reduce the bloated feeling in your belly (abdomen).  No driving for 24 hours (because of the medicine (anesthesia) used during the test).   Do not sign any important legal documents or operate any machinery for 24 hours (because of the anesthesia used during the test).  NUTRITION Drink plenty of fluids.  You may resume your normal diet as instructed by your doctor.  Begin with a light meal and progress to your normal diet. Heavy or fried foods are harder to digest and may make you feel sick to your stomach (nauseated).  Avoid alcoholic beverages for 24 hours or as instructed.  MEDICATIONS You may resume your normal medications unless your doctor tells you otherwise.  WHAT YOU CAN EXPECT TODAY Some feelings of bloating in the abdomen.  Passage of more gas than usual.  Spotting of blood in your stool or on the toilet paper.  IF YOU HAD POLYPS REMOVED DURING THE COLONOSCOPY: No aspirin products for 7 days or as instructed.  No alcohol for 7 days or as instructed.  Eat a soft diet for the next 24 hours.  FINDING OUT THE RESULTS OF YOUR TEST Not all test results are available during your visit. If your test results are not back during the visit, make an appointment with your caregiver to find out the results. Do not assume everything is normal if you have not heard from your  caregiver or the medical facility. It is important for you to follow up on all of your test results.  SEEK IMMEDIATE MEDICAL ATTENTION IF: You have more than a spotting of blood in your stool.  Your belly is swollen (abdominal distention).  You are nauseated or vomiting.  You have a temperature over 101.  You have abdominal pain or discomfort that is severe or gets worse throughout the day.   Your colonoscopy was relatively unremarkable.  I did not find any polyps or evidence of colon cancer.  I recommend repeating colonoscopy in 10 years for colon cancer screening purposes.  You do have diverticulosis and internal hemorrhoids. I would recommend increasing fiber in your diet or adding OTC Benefiber/Metamucil. Be sure to drink at least 4 to 6 glasses of water daily. Follow-up with GI as needed.  Okay to resume Xarelto today.   I hope you have a great rest of your week!  Elon Alas. Abbey Chatters, D.O. Gastroenterology and Hepatology [facility]

## 2021-04-20 NOTE — Anesthesia Postprocedure Evaluation (Signed)
Anesthesia Post Note  Patient: Brian Roach  Procedure(s) Performed: COLONOSCOPY WITH PROPOFOL  Patient location during evaluation: Phase II Anesthesia Type: General Level of consciousness: awake and alert and oriented Pain management: pain level controlled Vital Signs Assessment: post-procedure vital signs reviewed and stable Respiratory status: spontaneous breathing, nonlabored ventilation and respiratory function stable Cardiovascular status: blood pressure returned to baseline and stable Postop Assessment: no apparent nausea or vomiting Anesthetic complications: no   No notable events documented.   Last Vitals:  Vitals:   04/20/21 0700 04/20/21 0805  BP: 134/76 (!) 90/58  Pulse: 79 84  Resp: 18 16  Temp: 36.6 C 36.4 C  SpO2: 94% 98%    Last Pain:  Vitals:   04/20/21 0805  TempSrc: Axillary  PainSc: 0-No pain                 Helia Haese C Ariely Riddell

## 2021-04-20 NOTE — H&P (Signed)
Primary Care Physician:  [name], MD Primary Gastroenterologist:  Dr. Marletta Lor  Pre-Procedure History & Physical: HPI:  Brian Roach is a 61 y.o. male is here for first ever colonoscopy for colon cancer screening purposes.  Patient denies any family history of colorectal cancer.  No melena or hematochezia.  No abdominal pain or unintentional weight loss.  No change in bowel habits.  Overall feels well from a GI standpoint.  Past Medical History:  Diagnosis Date   Diabetes mellitus without complication (HCC)    Hyperlipidemia    Hypertension    PE (pulmonary embolism)    PONV (postoperative nausea and vomiting)     Past Surgical History:  Procedure Laterality Date   ANTERIOR CRUCIATE LIGAMENT REPAIR Right    BACK SURGERY     KNEE SURGERY     LUNG SURGERY     Unspecified but was done for PE. Had chest tubes. Approx 2003   QUADRICEPS TENDON REPAIR Left 09/23/2016   Procedure: REPAIR QUADRICEP TENDON;  Surgeon: Vickki Hearing, MD;  Location: [facility];  Service: Orthopedics;  Laterality: Left;   WISDOM TOOTH EXTRACTION      Prior to Admission medications   Medication Sig Start Date End Date Taking? Authorizing Provider  atorvastatin (LIPITOR) 20 MG tablet TAKE 1 TABLET BY MOUTH EVERY DAY IN THE EVENING 11/16/20  Yes Stacks, Broadus John, MD  benazepril (LOTENSIN) 20 MG tablet Take 1 tablet (20 mg total) by mouth daily. 11/16/20  Yes Stacks, Broadus John, MD  cetirizine (ZYRTEC) 10 MG tablet TAKE 1 TABLET (10 MG TOTAL) BY MOUTH DAILY. FOR ALLERGY SYMPTOMS 04/02/21  Yes Stacks, Broadus John, MD  cyclobenzaprine (FLEXERIL) 10 MG tablet TAKE 1 TABLET BY MOUTH THREE TIMES A DAY AS NEEDED FOR MUSCLE SPASMS 11/11/20  Yes Stacks, Broadus John, MD  diltiazem (CARDIZEM CD) 360 MG 24 hr capsule TAKE 1 CAPSULE BY MOUTH EVERY DAY 11/16/20  Yes Stacks, Broadus John, MD  fluticasone (FLONASE) 50 MCG/ACT nasal spray SPRAY 2 SPRAYS INTO EACH NOSTRIL EVERY DAY 07/07/20  Yes Stacks, Broadus John, MD  linaclotide (LINZESS) 290 MCG CAPS  capsule TAKE 1 CAPSULE (290 MCG TOTAL) BY MOUTH DAILY. TO REGULATE BOWEL MOVEMENTS 11/16/20  Yes [name], MD  metFORMIN (GLUCOPHAGE-XR) 750 MG 24 hr tablet TAKE 1 TABLET BY MOUTH WITH BREAKFAST AND SUPPER 11/16/20  Yes Stacks, Broadus John, MD  Multiple Vitamins-Minerals (ONE-A-DAY MENS HEALTH FORMULA PO) Take 1 tablet by mouth daily.   Yes [provider]  Omega-3 Fatty Acids (FISH OIL) 1000 MG CAPS Take 1,000 mg by mouth in the morning and at bedtime.   Yes [provider]  omeprazole (PRILOSEC) 20 MG capsule TAKE 1 CAPSULE BY MOUTH EVERY DAY 11/16/20  Yes Stacks, Broadus John, MD  ondansetron (ZOFRAN) 4 MG tablet TAKE 1 TABLET BY MOUTH EVERY 8 HOURS AS NEEDED FOR NAUSEA AND VOMITING 01/19/21  Yes [name], MD  rivaroxaban (XARELTO) 20 MG TABS tablet Take 1 tablet (20 mg total) by mouth daily with supper. 11/16/20  Yes [name], MD  Semaglutide,0.25 or 0.5MG /DOS, (OZEMPIC, 0.25 OR 0.5 MG/DOSE,) 2 MG/1.5ML SOPN Inject 0.5 mg into the skin once a week. Patient taking differently: Inject 0.5 mg into the skin every Thursday. 04/02/21  Yes [name], MD  trazodone (DESYREL) 300 MG tablet TAKE 1 TABLET BY MOUTH EVERYDAY AT BEDTIME 11/16/20  Yes [name], MD    Allergies as of 03/19/2021   (No Known Allergies)    Family History  Problem Relation Age of Onset   Heart disease  Father    Heart disease Brother    Colon cancer Neg Hx     Social History   Socioeconomic History   Marital status: Divorced    Spouse name: Not on file   Number of children: Not on file   Years of education: Not on file   Highest education level: Not on file  Occupational History   Not on file  Tobacco Use   Smoking status: Never   Smokeless tobacco: Never  Vaping Use   Vaping Use: Never used  Substance and Sexual Activity   Alcohol use: No   Drug use: No   Sexual activity: Yes  Other Topics Concern   Not on file  Social History Narrative   Not on file   Social Determinants  of Health   Financial Resource Strain: Not on file  Food Insecurity: Not on file  Transportation Needs: Not on file  Physical Activity: Not on file  Stress: Not on file  Social Connections: Not on file  Intimate Partner Violence: Not on file    Review of Systems: See HPI, otherwise negative ROS  Physical Exam: Vital signs in last 24 hours: Temp:  [97.8 F (36.6 C)] 97.8 F (36.6 C) (02/20 0700) Pulse Rate:  [79] 79 (02/20 0700) Resp:  [18] 18 (02/20 0700) BP: (134)/(76) 134/76 (02/20 0700) SpO2:  [94 %] 94 % (02/20 0700)   General:   Alert,  Well-developed, well-nourished, pleasant and cooperative in NAD Head:  Normocephalic and atraumatic. Eyes:  Sclera clear, no icterus.   Conjunctiva pink. Ears:  Normal auditory acuity. Nose:  No deformity, discharge,  or lesions. Mouth:  No deformity or lesions, dentition normal. Neck:  Supple; no masses or thyromegaly. Lungs:  Clear throughout to auscultation.   No wheezes, crackles, or rhonchi. No acute distress. Heart:  Regular rate and rhythm; no murmurs, clicks, rubs,  or gallops. Abdomen:  Soft, nontender and nondistended. No masses, hepatosplenomegaly or hernias noted. Normal bowel sounds, without guarding, and without rebound.   Msk:  Symmetrical without gross deformities. Normal posture. Extremities:  Without clubbing or edema. Neurologic:  Alert and  oriented x4;  grossly normal neurologically. Skin:  Intact without significant lesions or rashes. Cervical Nodes:  No significant cervical adenopathy. Psych:  Alert and cooperative. Normal mood and affect.  Impression/Plan: Brian Roach is here for a colonoscopy to be performed for colon cancer screening purposes.  The risks of the procedure including infection, bleed, or perforation as well as benefits, limitations, alternatives and imponderables have been reviewed with the patient. Questions have been answered. All parties agreeable.

## 2021-04-20 NOTE — Op Note (Signed)
[facility] Patient Name: Brian Roach Procedure Date: 04/20/2021 7:11 AM MRN: [id number] Date of Birth: 25-Sep-1960 Attending MD: Elon Alas. Edgar Frisk CSN: [id number] Age: 61 Admit Type: Outpatient Procedure:                Colonoscopy Indications:              Screening for colorectal malignant neoplasm Providers:                Elon Alas. Abbey Chatters, DO, Lambert Mody, Caprice Kluver, Hughie Closs RN, RN, Aram Candela Referring MD:              Medicines:                See the Anesthesia note for documentation of the                            administered medications Complications:            No immediate complications. Estimated Blood Loss:     Estimated blood loss: none. Procedure:                Pre-Anesthesia Assessment:                           - The anesthesia plan was to use monitored                            anesthesia care (MAC).                           After obtaining informed consent, the colonoscope                            was passed under direct vision. Throughout the                            procedure, the patient's blood pressure, pulse, and                            oxygen saturations were monitored continuously. The                            [id number]-HQ190L ([id number]) scope was introduced through                            the anus and advanced to the the cecum, identified                            by appendiceal orifice and ileocecal valve. The                            colonoscopy was performed without difficulty. The                            patient tolerated the  procedure well. The quality                            of the bowel preparation was evaluated using the                            BBPS [facility] Bowel Preparation Scale) with scores                            of: Right Colon = 2 (minor amount of residual                            staining, small fragments of stool and/or opaque                             liquid, but mucosa seen well), Transverse Colon = 2                            (minor amount of residual staining, small fragments                            of stool and/or opaque liquid, but mucosa seen                            well) and Left Colon = 2 (minor amount of residual                            staining, small fragments of stool and/or opaque                            liquid, but mucosa seen well). The total BBPS score                            equals 6. The quality of the bowel preparation was                            fair. Scope In: 7:42:41 AM Scope Out: 8:00:48 AM Scope Withdrawal Time: 0 hours 12 minutes 49 seconds  Total Procedure Duration: 0 hours 18 minutes 7 seconds  Findings:      The perianal and digital rectal examinations were normal.      Non-bleeding internal hemorrhoids were found during endoscopy.      Multiple medium-mouthed diverticula were found in the sigmoid colon.      The exam was otherwise without abnormality.      A moderate amount of stool was found in the ascending colon and in the       cecum, making visualization difficult. Lavage of the area was performed       using copious amounts of sterile water, resulting in clearance with fair       visualization. Impression:               - Preparation of the colon was fair.                           -  Non-bleeding internal hemorrhoids.                           - Diverticulosis in the sigmoid colon.                           - The examination was otherwise normal.                           - Stool in the ascending colon and in the cecum.                           - No specimens collected. Moderate Sedation:      Per Anesthesia Care Recommendation:           - Patient has a contact number available for                            emergencies. The signs and symptoms of potential                            delayed complications were discussed with the                            patient. Return to  normal activities tomorrow.                            Written discharge instructions were provided to the                            patient.                           - Resume previous diet.                           - Continue present medications.                           - Repeat colonoscopy in 10 years for screening                            purposes.                           - Return to GI clinic PRN. Procedure Code(s):        --- Professional ---                           V7793, Colorectal cancer screening; colonoscopy on                            individual not meeting criteria for high risk Diagnosis Code(s):        --- Professional ---                           Z12.11, Encounter for screening for  malignant                            neoplasm of colon                           K64.8, Other hemorrhoids                           K57.30, Diverticulosis of large intestine without                            perforation or abscess without bleeding CPT copyright 2019 American Medical Association. All rights reserved. The codes documented in this report are preliminary and upon coder review may  be revised to meet current compliance requirements. Elon Alas. Abbey Chatters, DO [place] Abbey Chatters, DO 04/20/2021 8:03:39 AM This report has been signed electronically. Number of Addenda: 0

## 2021-04-20 NOTE — Anesthesia Preprocedure Evaluation (Signed)
Anesthesia Evaluation  Patient identified by MRN, date of birth, ID band Patient awake    Reviewed: Allergy & Precautions, NPO status , Patient's Chart, lab work & pertinent test results  History of Anesthesia Complications (+) PONV and history of anesthetic complications  Airway Mallampati: III  TM Distance: >3 FB Neck ROM: Limited   Comment: Neck pain, stiff neck Dental  (+) Dental Advisory Given   Pulmonary PE   Pulmonary exam normal breath sounds clear to auscultation       Cardiovascular Exercise Tolerance: Good hypertension, Pt. on medications + DVT  Normal cardiovascular exam+ dysrhythmias Supra Ventricular Tachycardia  Rhythm:Regular Rate:Normal     Neuro/Psych  Neuromuscular disease negative psych ROS   GI/Hepatic Neg liver ROS, GERD  Medicated and Controlled,  Endo/Other  diabetes, Well Controlled, Type 2, Oral Hypoglycemic Agents  Renal/GU negative Renal ROS  negative genitourinary   Musculoskeletal  (+) Arthritis , Osteoarthritis,    Abdominal   Peds negative pediatric ROS (+)  Hematology negative hematology ROS (+)   Anesthesia Other Findings Neck pain, back pain  Reproductive/Obstetrics negative OB ROS                            Anesthesia Physical Anesthesia Plan  ASA: 3  Anesthesia Plan: General   Post-op Pain Management: Minimal or no pain anticipated   Induction: Intravenous  PONV Risk Score and Plan: TIVA  Airway Management Planned: Nasal Cannula, Natural Airway and Simple Face Mask  Additional Equipment:   Intra-op Plan:   Post-operative Plan:   Informed Consent: I have reviewed the patients History and Physical, chart, labs and discussed the procedure including the risks, benefits and alternatives for the proposed anesthesia with the patient or authorized representative who has indicated his/her understanding and acceptance.     Dental advisory  given  Plan Discussed with: CRNA and Surgeon  Anesthesia Plan Comments:        Anesthesia Quick Evaluation

## 2021-04-20 NOTE — Transfer of Care (Signed)
Immediate Anesthesia Transfer of Care Note  Patient: Brian Roach  Procedure(s) Performed: COLONOSCOPY WITH PROPOFOL  Patient Location: Short Stay  Anesthesia Type:General  Level of Consciousness: drowsy  Airway & Oxygen Therapy: Patient Spontanous Breathing  Post-op Assessment: Report given to RN and Post -op Vital signs reviewed and stable  Post vital signs: Reviewed and stable  Last Vitals:  Vitals Value Taken Time  BP    Temp    Pulse    Resp    SpO2      Last Pain:  Vitals:   04/20/21 0736  TempSrc:   PainSc: 0-No pain      Patients Stated Pain Goal: 5 ([id number] [phone])  Complications: No notable events documented.

## 2021-04-22 ENCOUNTER — Encounter (HOSPITAL_COMMUNITY): Payer: Self-pay | Admitting: Internal Medicine

## 2021-05-01 ENCOUNTER — Other Ambulatory Visit: Payer: Self-pay | Admitting: Family Medicine

## 2021-05-20 ENCOUNTER — Ambulatory Visit: Payer: BC Managed Care – PPO | Admitting: Family Medicine

## 2021-05-20 ENCOUNTER — Encounter: Payer: Self-pay | Admitting: Family Medicine

## 2021-05-20 VITALS — BP 123/72 | HR 74 | Temp 97.6°F | Ht 70.0 in | Wt 249.6 lb

## 2021-05-20 DIAGNOSIS — E7800 Pure hypercholesterolemia, unspecified: Secondary | ICD-10-CM | POA: Diagnosis not present

## 2021-05-20 DIAGNOSIS — Z23 Encounter for immunization: Secondary | ICD-10-CM

## 2021-05-20 DIAGNOSIS — E1165 Type 2 diabetes mellitus with hyperglycemia: Secondary | ICD-10-CM | POA: Diagnosis not present

## 2021-05-20 DIAGNOSIS — I10 Essential (primary) hypertension: Secondary | ICD-10-CM | POA: Diagnosis not present

## 2021-05-20 LAB — BAYER DCA HB A1C WAIVED: HB A1C (BAYER DCA - WAIVED): 6.2 % — ABNORMAL HIGH (ref 4.8–5.6)

## 2021-05-20 NOTE — Addendum Note (Signed)
Addended by: Diamantina Monks on: 05/20/2021 04:40 PM ? ? Modules accepted: Orders ? ?

## 2021-05-20 NOTE — Progress Notes (Signed)
? ?Subjective:  ?Patient ID: Brian Roach,  ?male    DOB: Jul 17, 1960  Age: 61 y.o.  ? ? ?CC: Medical Management of Chronic Issues ? ? ?HPI ?[name] Presume presents for  follow-up of hypertension. Patient has no history of headache chest pain or shortness of breath or recent cough. Patient also denies symptoms of TIA such as numbness weakness lateralizing. Patient denies side effects from medication. States taking it regularly. ? ?Patient also  in for follow-up of elevated cholesterol. Doing well without complaints on current medication. Denies side effects  including myalgia and arthralgia and nausea. Also in today for liver function testing. Currently no chest pain, shortness of breath or other cardiovascular related symptoms noted. ? ?Follow-up of diabetes. Patient does check blood sugar at home. Readings run between 80-90 and 130-14o prandial ?Patient denies symptoms such as excessive hunger or urinary frequency, excessive hunger, nausea he says he does crave ice cream at night.  He says that slowing down his weight loss.  He is down 5 more pounds.  Overall down closer to 50 pounds since starting on the Ozempic. ?No significant hypoglycemic spells noted. ?Medications reviewed. Pt reports taking them regularly. Pt. denies complication/adverse reaction today.  ? ? ?History ?Banjamin has a past medical history of Diabetes mellitus without complication ([place]), Hyperlipidemia, Hypertension, PE (pulmonary embolism), and PONV (postoperative nausea and vomiting).  ? ?He has a past surgical history that includes Back surgery; Knee surgery; Lung surgery; Anterior cruciate ligament repair (Right); Wisdom tooth extraction; Quadriceps tendon repair (Left, 09/23/2016); and Colonoscopy with propofol (N/A, 04/20/2021).  ? ?His family history includes Heart disease in his brother and father.He reports that he has never smoked. He has never used smokeless tobacco. He reports that he does not drink alcohol and does not use  drugs. ? ?Current Outpatient Medications on File Prior to Visit  ?Medication Sig Dispense Refill  ? atorvastatin (LIPITOR) 20 MG tablet TAKE 1 TABLET BY MOUTH EVERY DAY IN THE EVENING 90 tablet 3  ? benazepril (LOTENSIN) 20 MG tablet Take 1 tablet (20 mg total) by mouth daily. 90 tablet 3  ? cetirizine (ZYRTEC) 10 MG tablet TAKE 1 TABLET (10 MG TOTAL) BY MOUTH DAILY. FOR ALLERGY SYMPTOMS 90 tablet 3  ? cyclobenzaprine (FLEXERIL) 10 MG tablet TAKE 1 TABLET BY MOUTH THREE TIMES A DAY AS NEEDED FOR MUSCLE SPASMS 90 tablet 5  ? diltiazem (CARDIZEM CD) 360 MG 24 hr capsule TAKE 1 CAPSULE BY MOUTH EVERY DAY 90 capsule 3  ? fluticasone (FLONASE) 50 MCG/ACT nasal spray SPRAY 2 SPRAYS INTO EACH NOSTRIL EVERY DAY 48 mL 1  ? linaclotide (LINZESS) 290 MCG CAPS capsule TAKE 1 CAPSULE (290 MCG TOTAL) BY MOUTH DAILY. TO REGULATE BOWEL MOVEMENTS 90 capsule 3  ? metFORMIN (GLUCOPHAGE-XR) 750 MG 24 hr tablet TAKE 1 TABLET BY MOUTH WITH BREAKFAST AND SUPPER 180 tablet 3  ? Multiple Vitamins-Minerals (ONE-A-DAY MENS HEALTH FORMULA PO) Take 1 tablet by mouth daily.    ? Omega-3 Fatty Acids (FISH OIL) 1000 MG CAPS Take 1,000 mg by mouth in the morning and at bedtime.    ? omeprazole (PRILOSEC) 20 MG capsule TAKE 1 CAPSULE BY MOUTH EVERY DAY 90 capsule 3  ? ondansetron (ZOFRAN) 4 MG tablet TAKE 1 TABLET BY MOUTH EVERY 8 HOURS AS NEEDED FOR NAUSEA AND VOMITING 18 tablet 3  ? OZEMPIC, 0.25 OR 0.5 MG/DOSE, 2 MG/1.5ML SOPN INJECT 0.5 MG INTO THE SKIN ONCE A WEEK. 6 mL 0  ? rivaroxaban (XARELTO) 20  MG TABS tablet Take 1 tablet (20 mg total) by mouth daily with supper. 90 tablet 3  ? trazodone (DESYREL) 300 MG tablet TAKE 1 TABLET BY MOUTH EVERYDAY AT BEDTIME 90 tablet 3  ? ?No current facility-administered medications on file prior to visit.  ? ? ?ROS ?Review of Systems  ?Constitutional:  Negative for fever.  ?Respiratory:  Negative for shortness of breath.   ?Cardiovascular:  Negative for chest pain.  ?Musculoskeletal:  Negative for  arthralgias.  ?Skin:  Negative for rash.  ? ?Objective:  ?BP 123/72   Pulse 74   Temp 97.6 ?F (36.4 ?C)   Ht $R'5\' 10"'do$  (1.778 m)   Wt 249 lb 9.6 oz (113.2 kg)   SpO2 97%   BMI 35.81 kg/m?  ? ?BP Readings from Last 3 Encounters:  ?05/20/21 123/72  ?04/20/21 (!) 90/58  ?04/16/21 132/77  ? ? ?Wt Readings from Last 3 Encounters:  ?05/20/21 249 lb 9.6 oz (113.2 kg)  ?04/16/21 250 lb (113.4 kg)  ?02/04/21 254 lb (115.2 kg)  ? ? ? ?Physical Exam ?Constitutional:   ?   General: He is not in acute distress. ?   Appearance: He is well-developed.  ?HENT:  ?   Head: Normocephalic and atraumatic.  ?   Right Ear: External ear normal.  ?   Left Ear: External ear normal.  ?   Nose: Nose normal.  ?Eyes:  ?   Conjunctiva/sclera: Conjunctivae normal.  ?   Pupils: Pupils are equal, round, and reactive to light.  ?Cardiovascular:  ?   Rate and Rhythm: Normal rate and regular rhythm.  ?   Heart sounds: Normal heart sounds. No murmur heard. ?Pulmonary:  ?   Effort: Pulmonary effort is normal. No respiratory distress.  ?   Breath sounds: Normal breath sounds. No wheezing or rales.  ?Abdominal:  ?   Palpations: Abdomen is soft.  ?   Tenderness: There is no abdominal tenderness.  ?Musculoskeletal:     ?   General: Normal range of motion.  ?   Cervical back: Normal range of motion and neck supple.  ?Skin: ?   General: Skin is warm and dry.  ?Neurological:  ?   Mental Status: He is alert and oriented to person, place, and time.  ?   Deep Tendon Reflexes: Reflexes are normal and symmetric.  ?Psychiatric:     ?   Behavior: Behavior normal.     ?   Thought Content: Thought content normal.     ?   Judgment: Judgment normal.  ? ? ?Diabetic Foot Exam - Simple   ?No data filed ?  ? ? ? ? ?Assessment & Plan:  ? ?Quill was seen today for medical management of chronic issues. ? ?Diagnoses and all orders for this visit: ? ?Uncontrolled type 2 diabetes mellitus with hyperglycemia ([place]) ?-     Bayer DCA Hb A1c Waived ? ?HTN (hypertension), benign ?-      CBC with Differential/Platelet ?-     CMP14+EGFR ? ?Hypercholesteremia ?-     Lipid panel ? ? ?I am having [name] maintain his Multiple Vitamins-Minerals (ONE-A-DAY MENS HEALTH FORMULA PO), fluticasone, cyclobenzaprine, atorvastatin, benazepril, diltiazem, linaclotide, metFORMIN, omeprazole, rivaroxaban, trazodone, ondansetron, cetirizine, Fish Oil, and Ozempic (0.25 or 0.5 MG/DOSE). ? ?Continue weight loss with a goal of a pound a week for total of 12 pounds between now next time they will put him at 238 on our scale.  A1c excellent today at 6.2. ? ? ?Follow-up:  Return in about 3 months (around 08/20/2021). ? ?Claretta Fraise, M.D. ?

## 2021-05-21 LAB — LIPID PANEL
Chol/HDL Ratio: 2.3 ratio (ref 0.0–5.0)
Cholesterol, Total: 117 mg/dL (ref 100–199)
HDL: 52 mg/dL (ref >39)
LDL Chol Calc (NIH): 53 mg/dL (ref 0–99)
Triglycerides: 55 mg/dL (ref 0–149)
VLDL Cholesterol Cal: 12 mg/dL (ref 5–40)

## 2021-05-21 LAB — CBC WITH DIFFERENTIAL/PLATELET
Basophils Absolute: 0.1 10*3/uL (ref 0.0–0.2)
Basos: 1 %
EOS (ABSOLUTE): 0.4 10*3/uL (ref 0.0–0.4)
Eos: 4 %
Hematocrit: 44.9 % (ref 37.5–51.0)
Hemoglobin: 15 g/dL (ref 13.0–17.7)
Immature Grans (Abs): 0.1 10*3/uL (ref 0.0–0.1)
Immature Granulocytes: 1 %
Lymphocytes Absolute: 2.9 10*3/uL (ref 0.7–3.1)
Lymphs: 26 %
MCH: 30 pg (ref 26.6–33.0)
MCHC: 33.4 g/dL (ref 31.5–35.7)
MCV: 90 fL (ref 79–97)
Monocytes Absolute: 0.9 10*3/uL (ref 0.1–0.9)
Monocytes: 8 %
Neutrophils Absolute: 6.7 10*3/uL (ref 1.4–7.0)
Neutrophils: 60 %
Platelets: 277 10*3/uL (ref 150–450)
RBC: 5 x10E6/uL (ref 4.14–5.80)
RDW: 13 % (ref 11.6–15.4)
WBC: 11 10*3/uL — ABNORMAL HIGH (ref 3.4–10.8)

## 2021-05-21 LAB — CMP14+EGFR
ALT: 31 IU/L (ref 0–44)
AST: 27 IU/L (ref 0–40)
Albumin/Globulin Ratio: 1.9 (ref 1.2–2.2)
Albumin: 4.6 g/dL (ref 3.8–4.8)
Alkaline Phosphatase: 76 IU/L (ref 44–121)
BUN/Creatinine Ratio: 10 (ref 10–24)
BUN: 11 mg/dL (ref 8–27)
Bilirubin Total: 0.4 mg/dL (ref 0.0–1.2)
CO2: 25 mmol/L (ref 20–29)
Calcium: 9.7 mg/dL (ref 8.6–10.2)
Chloride: 101 mmol/L (ref 96–106)
Creatinine, Ser: 1.14 mg/dL (ref 0.76–1.27)
Globulin, Total: 2.4 g/dL (ref 1.5–4.5)
Glucose: 132 mg/dL — ABNORMAL HIGH (ref 70–99)
Potassium: 5.2 mmol/L (ref 3.5–5.2)
Sodium: 144 mmol/L (ref 134–144)
Total Protein: 7 g/dL (ref 6.0–8.5)
eGFR: 73 mL/min/{1.73_m2} (ref >59)

## 2021-05-21 NOTE — Progress Notes (Signed)
Hello Minnie,  Your lab result is normal and/or stable.Some minor variations that are not significant are commonly marked abnormal, but do not represent any medical problem for you.  Best regards, Nickolai Rinks, M.D.

## 2021-05-29 ENCOUNTER — Other Ambulatory Visit: Payer: Self-pay | Admitting: Family Medicine

## 2021-06-04 ENCOUNTER — Telehealth: Payer: Self-pay | Admitting: Family Medicine

## 2021-06-04 MED ORDER — SEMAGLUTIDE (1 MG/DOSE) 4 MG/3ML ~~LOC~~ SOPN: 1.0000 mg | PEN_INJECTOR | SUBCUTANEOUS | 1 refills | Status: DC

## 2021-06-04 NOTE — Telephone Encounter (Signed)
Pt aware I resnt in ozempic ?

## 2021-07-30 ENCOUNTER — Other Ambulatory Visit: Payer: Self-pay | Admitting: Family Medicine

## 2021-08-10 ENCOUNTER — Other Ambulatory Visit: Payer: Self-pay | Admitting: Family Medicine

## 2021-08-24 ENCOUNTER — Encounter: Payer: Self-pay | Admitting: Family Medicine

## 2021-08-24 ENCOUNTER — Ambulatory Visit: Payer: BC Managed Care – PPO | Admitting: Family Medicine

## 2021-08-24 VITALS — BP 125/73 | HR 77 | Temp 97.7°F | Ht 70.0 in | Wt 243.4 lb

## 2021-08-24 DIAGNOSIS — L309 Dermatitis, unspecified: Secondary | ICD-10-CM

## 2021-08-24 DIAGNOSIS — Z125 Encounter for screening for malignant neoplasm of prostate: Secondary | ICD-10-CM | POA: Diagnosis not present

## 2021-08-24 DIAGNOSIS — E1165 Type 2 diabetes mellitus with hyperglycemia: Secondary | ICD-10-CM

## 2021-08-24 DIAGNOSIS — I10 Essential (primary) hypertension: Secondary | ICD-10-CM | POA: Diagnosis not present

## 2021-08-24 DIAGNOSIS — E7800 Pure hypercholesterolemia, unspecified: Secondary | ICD-10-CM | POA: Diagnosis not present

## 2021-08-24 LAB — BAYER DCA HB A1C WAIVED: HB A1C (BAYER DCA - WAIVED): 6 % — ABNORMAL HIGH (ref 4.8–5.6)

## 2021-08-24 MED ORDER — TRIAMCINOLONE ACETONIDE 0.1 % EX CREA: 1.0000 | TOPICAL_CREAM | Freq: Three times a day (TID) | CUTANEOUS | 0 refills | Status: DC

## 2021-08-24 NOTE — Progress Notes (Signed)
Subjective:  Patient ID: Brian Roach,  male    DOB: 1960-11-16  Age: 61 y.o.    CC: Medical Management of Chronic Issues   HPI [name] presents for  follow-up of hypertension. Patient has no history of headache chest pain or shortness of breath or recent cough. Patient also denies symptoms of TIA such as numbness weakness lateralizing. Patient denies side effects from medication. States taking it regularly.  Patient also  in for follow-up of elevated cholesterol. Doing well without complaints on current medication. Denies side effects  including myalgia and arthralgia and nausea. Also in today for liver function testing. Currently no chest pain, shortness of breath or other cardiovascular related symptoms noted.  Follow-up of diabetes. Patient does check blood sugar at home. Readings run between 110-125 fasting and 94-150 prandial. Log reviewed.  Patient denies symptoms such as excessive hunger or urinary frequency, excessive hunger, nausea No significant hypoglycemic spells noted. Medications reviewed. Pt reports taking them regularly. Pt. denies complication/adverse reaction today.    History [name] has a past medical history of Diabetes mellitus without complication ([facility]), Hyperlipidemia, Hypertension, PE (pulmonary embolism), and PONV (postoperative nausea and vomiting).   He has a past surgical history that includes Back surgery; Knee surgery; Lung surgery; Anterior cruciate ligament repair (Right); Wisdom tooth extraction; Quadriceps tendon repair (Left, 09/23/2016); Colonoscopy with propofol (N/A, 04/20/2021); and Cataract extraction (Right).   His family history includes Heart disease in his brother and father.He reports that he has never smoked. He has never used smokeless tobacco. He reports that he does not drink alcohol and does not use drugs.  Current Outpatient Medications on File Prior to Visit  Medication Sig Dispense Refill   atorvastatin (LIPITOR) 20 MG  tablet TAKE 1 TABLET BY MOUTH EVERY DAY IN THE EVENING 90 tablet 3   benazepril (LOTENSIN) 20 MG tablet Take 1 tablet (20 mg total) by mouth daily. 90 tablet 3   cetirizine (ZYRTEC) 10 MG tablet TAKE 1 TABLET (10 MG TOTAL) BY MOUTH DAILY. FOR ALLERGY SYMPTOMS 90 tablet 3   cyclobenzaprine (FLEXERIL) 10 MG tablet TAKE 1 TABLET BY MOUTH THREE TIMES A DAY AS NEEDED FOR MUSCLE SPASMS 90 tablet 5   diltiazem (CARDIZEM CD) 360 MG 24 hr capsule TAKE 1 CAPSULE BY MOUTH EVERY DAY 90 capsule 3   fluticasone (FLONASE) 50 MCG/ACT nasal spray SPRAY 2 SPRAYS INTO EACH NOSTRIL EVERY DAY 48 mL 1   linaclotide (LINZESS) 290 MCG CAPS capsule TAKE 1 CAPSULE (290 MCG TOTAL) BY MOUTH DAILY. TO REGULATE BOWEL MOVEMENTS 90 capsule 3   metFORMIN (GLUCOPHAGE-XR) 750 MG 24 hr tablet TAKE 1 TABLET BY MOUTH WITH BREAKFAST AND SUPPER 180 tablet 3   Multiple Vitamins-Minerals (ONE-A-DAY MENS HEALTH FORMULA PO) Take 1 tablet by mouth daily.     Omega-3 Fatty Acids (FISH OIL) 1000 MG CAPS Take 1,000 mg by mouth in the morning and at bedtime.     omeprazole (PRILOSEC) 20 MG capsule TAKE 1 CAPSULE BY MOUTH EVERY DAY 90 capsule 3   ondansetron (ZOFRAN) 4 MG tablet TAKE 1 TABLET BY MOUTH EVERY 8 HOURS AS NEEDED FOR NAUSEA AND VOMITING 18 tablet 3   OZEMPIC, 1 MG/DOSE, 4 MG/3ML SOPN INJECT 1 MG ONCE A WEEK AS DIRECTED 3 mL 0   rivaroxaban (XARELTO) 20 MG TABS tablet Take 1 tablet (20 mg total) by mouth daily with supper. 90 tablet 3   trazodone (DESYREL) 300 MG tablet TAKE 1 TABLET BY MOUTH EVERYDAY AT BEDTIME 90  tablet 3   No current facility-administered medications on file prior to visit.    ROS Review of Systems  Constitutional:  Negative for fever.  Respiratory:  Negative for shortness of breath.   Cardiovascular:  Negative for chest pain.  Musculoskeletal:  Negative for arthralgias.  Skin:  Positive for rash (itchy white spot at right ankle).    Objective:  BP 125/73   Pulse 77   Temp 97.7 F (36.5 C)   Ht 5\' 10"   (1.778 m)   Wt 243 lb 6.4 oz (110.4 kg)   SpO2 95%   BMI 34.92 kg/m   BP Readings from Last 3 Encounters:  08/24/21 125/73  05/20/21 123/72  04/20/21 (!) 90/58    Wt Readings from Last 3 Encounters:  08/24/21 243 lb 6.4 oz (110.4 kg)  05/20/21 249 lb 9.6 oz (113.2 kg)  04/16/21 250 lb (113.4 kg)     Physical Exam Vitals reviewed.  Constitutional:      Appearance: He is well-developed.  HENT:     Head: Normocephalic and atraumatic.     Right Ear: External ear normal.     Left Ear: External ear normal.     Mouth/Throat:     Pharynx: No oropharyngeal exudate or posterior oropharyngeal erythema.  Eyes:     Pupils: Pupils are equal, round, and reactive to light.  Cardiovascular:     Rate and Rhythm: Normal rate and regular rhythm.     Heart sounds: No murmur heard. Pulmonary:     Effort: No respiratory distress.     Breath sounds: Normal breath sounds.  Musculoskeletal:     Cervical back: Normal range of motion and neck supple.  Skin:    Findings: Lesion (5 mm scaly nodule right lateral malleolar area) present.  Neurological:     Mental Status: He is alert and oriented to person, place, and time.     Diabetic Foot Exam - Simple   No data filed     Lab Results  Component Value Date   HGBA1C 6.2 (H) 05/20/2021   HGBA1C 6.5 (H) 02/13/2021   HGBA1C 6.6 (H) 11/12/2020    Assessment & Plan:   Ancil was seen today for medical management of chronic issues.  Diagnoses and all orders for this visit:  Uncontrolled type 2 diabetes mellitus with hyperglycemia ([facility]) -     Bayer DCA Hb A1c Waived  HTN (hypertension), benign -     CBC with Differential/Platelet -     CMP14+EGFR  Hypercholesteremia -     Lipid panel  Prostate cancer screening -     PSA, total and free  Eczema, unspecified type  Other orders -     triamcinolone cream (KENALOG) 0.1 %; Apply 1 Application topically 3 (three) times daily. Avoid face and genitalia   I am having [name] start on triamcinolone cream. I am also having him maintain his Multiple Vitamins-Minerals (ONE-A-DAY MENS HEALTH FORMULA PO), fluticasone, atorvastatin, benazepril, diltiazem, linaclotide, metFORMIN, omeprazole, rivaroxaban, trazodone, cetirizine, Fish Oil, ondansetron, Ozempic (1 MG/DOSE), and cyclobenzaprine.  Meds ordered this encounter  Medications   triamcinolone cream (KENALOG) 0.1 %    Sig: Apply 1 Application topically 3 (three) times daily. Avoid face and genitalia    Dispense:  15 g    Refill:  0     Follow-up: Return in about 3 months (around 11/24/2021).  [name], M.D.

## 2021-08-25 ENCOUNTER — Other Ambulatory Visit: Payer: Self-pay | Admitting: Family Medicine

## 2021-08-25 LAB — CMP14+EGFR
ALT: 22 IU/L (ref 0–44)
AST: 18 IU/L (ref 0–40)
Albumin/Globulin Ratio: 1.9 (ref 1.2–2.2)
Albumin: 4.5 g/dL (ref 3.8–4.8)
Alkaline Phosphatase: 69 IU/L (ref 44–121)
BUN/Creatinine Ratio: 14 (ref 10–24)
BUN: 12 mg/dL (ref 8–27)
Bilirubin Total: 0.4 mg/dL (ref 0.0–1.2)
CO2: 21 mmol/L (ref 20–29)
Calcium: 9.4 mg/dL (ref 8.6–10.2)
Chloride: 103 mmol/L (ref 96–106)
Creatinine, Ser: 0.88 mg/dL (ref 0.76–1.27)
Globulin, Total: 2.4 g/dL (ref 1.5–4.5)
Glucose: 130 mg/dL — ABNORMAL HIGH (ref 70–99)
Potassium: 4.5 mmol/L (ref 3.5–5.2)
Sodium: 142 mmol/L (ref 134–144)
Total Protein: 6.9 g/dL (ref 6.0–8.5)
eGFR: 97 mL/min/{1.73_m2} (ref >59)

## 2021-08-25 LAB — CBC WITH DIFFERENTIAL/PLATELET
Basophils Absolute: 0.1 10*3/uL (ref 0.0–0.2)
Basos: 1 %
EOS (ABSOLUTE): 0.3 10*3/uL (ref 0.0–0.4)
Eos: 3 %
Hematocrit: 44.5 % (ref 37.5–51.0)
Hemoglobin: 14.9 g/dL (ref 13.0–17.7)
Immature Grans (Abs): 0 10*3/uL (ref 0.0–0.1)
Immature Granulocytes: 0 %
Lymphocytes Absolute: 2.2 10*3/uL (ref 0.7–3.1)
Lymphs: 21 %
MCH: 30.5 pg (ref 26.6–33.0)
MCHC: 33.5 g/dL (ref 31.5–35.7)
MCV: 91 fL (ref 79–97)
Monocytes Absolute: 0.9 10*3/uL (ref 0.1–0.9)
Monocytes: 9 %
Neutrophils Absolute: 7 10*3/uL (ref 1.4–7.0)
Neutrophils: 66 %
Platelets: 279 10*3/uL (ref 150–450)
RBC: 4.88 x10E6/uL (ref 4.14–5.80)
RDW: 13.1 % (ref 11.6–15.4)
WBC: 10.5 10*3/uL (ref 3.4–10.8)

## 2021-08-25 LAB — PSA, TOTAL AND FREE
PSA, Free Pct: 30.8 %
PSA, Free: 0.37 ng/mL
Prostate Specific Ag, Serum: 1.2 ng/mL (ref 0.0–4.0)

## 2021-08-25 LAB — LIPID PANEL
Chol/HDL Ratio: 2.6 ratio (ref 0.0–5.0)
Cholesterol, Total: 95 mg/dL — ABNORMAL LOW (ref 100–199)
HDL: 37 mg/dL — ABNORMAL LOW (ref >39)
LDL Chol Calc (NIH): 46 mg/dL (ref 0–99)
Triglycerides: 49 mg/dL (ref 0–149)
VLDL Cholesterol Cal: 12 mg/dL (ref 5–40)

## 2021-09-21 ENCOUNTER — Other Ambulatory Visit: Payer: Self-pay | Admitting: Family

## 2021-10-10 ENCOUNTER — Other Ambulatory Visit: Payer: Self-pay | Admitting: Family Medicine

## 2021-10-12 ENCOUNTER — Ambulatory Visit (INDEPENDENT_AMBULATORY_CARE_PROVIDER_SITE_OTHER): Payer: BC Managed Care – PPO | Admitting: Family Medicine

## 2021-10-12 DIAGNOSIS — U071 COVID-19: Secondary | ICD-10-CM | POA: Diagnosis not present

## 2021-10-12 NOTE — Progress Notes (Signed)
    Subjective:    Patient ID: Brian Roach, male    DOB: 1961/02/01, 61 y.o.   MRN: [id number]   HPI: Brian Roach is a 61 y.o. male presenting for positive Covid test at home. @ days of cough, fever to 100. Sneezing a lot. A little headache. Denies dyspnea. Scant clear sputum. Feeling a little tired.       08/24/2021    8:32 AM 05/20/2021    8:51 AM 02/04/2021    8:57 AM 11/12/2020    3:55 PM 07/17/2020    8:53 AM  Depression screen PHQ 2/9  Decreased Interest 0 0 0 0 0  Down, Depressed, Hopeless 0 0 0 0 0  PHQ - 2 Score 0 0 0 0 0     Relevant past medical, surgical, family and social history reviewed and updated as indicated.  Interim medical history since our last visit reviewed. Allergies and medications reviewed and updated.  ROS:  Review of Systems  Constitutional:  Negative for activity change, appetite change, chills and fever.  HENT:  Positive for congestion, rhinorrhea and sore throat. Negative for ear discharge, ear pain, hearing loss, nosebleeds, postnasal drip, sinus pressure, sneezing and trouble swallowing.   Respiratory:  Negative for chest tightness and shortness of breath.   Cardiovascular:  Negative for chest pain and palpitations.     Social History   Tobacco Use  Smoking Status Never  Smokeless Tobacco Never       Objective:     Wt Readings from Last 3 Encounters:  08/24/21 243 lb 6.4 oz (110.4 kg)  05/20/21 249 lb 9.6 oz (113.2 kg)  04/16/21 250 lb (113.4 kg)     Exam deferred. Pt. Harboring due to COVID 19. Phone visit performed.   Assessment & Plan:   1. COVID-19 virus infection    Pt. To use OTC med's, rest at home. Report sx consistent with worsening infection       Virtual Visit via telephone Note  I discussed the limitations, risks, security and privacy concerns of performing an evaluation and management service by telephone and the availability of in person appointments. The patient was identified with two identifiers.  Pt.expressed understanding and agreed to proceed. Pt. Is at home. [name] is in his office.  Follow Up Instructions:   I discussed the assessment and treatment plan with the patient. The patient was provided an opportunity to ask questions and all were answered. The patient agreed with the plan and demonstrated an understanding of the instructions.   The patient was advised to call back or seek an in-person evaluation if the symptoms worsen or if the condition fails to improve as anticipated.   Total minutes including chart review and phone contact time: 6   Follow up plan: Return if symptoms worsen or fail to improve.  [name], MD [place] [facility]

## 2021-10-14 ENCOUNTER — Encounter: Payer: Self-pay | Admitting: Family Medicine

## 2021-11-16 LAB — HM DIABETES EYE EXAM

## 2021-11-29 ENCOUNTER — Other Ambulatory Visit: Payer: Self-pay | Admitting: Family Medicine

## 2021-11-29 ENCOUNTER — Encounter: Payer: Self-pay | Admitting: Family Medicine

## 2021-11-29 DIAGNOSIS — I10 Essential (primary) hypertension: Secondary | ICD-10-CM

## 2021-11-29 DIAGNOSIS — E1165 Type 2 diabetes mellitus with hyperglycemia: Secondary | ICD-10-CM

## 2021-11-30 ENCOUNTER — Ambulatory Visit: Payer: BC Managed Care – PPO | Admitting: Family Medicine

## 2021-11-30 ENCOUNTER — Other Ambulatory Visit: Payer: Self-pay | Admitting: Family Medicine

## 2021-11-30 DIAGNOSIS — E1165 Type 2 diabetes mellitus with hyperglycemia: Secondary | ICD-10-CM

## 2021-11-30 MED ORDER — OZEMPIC (2 MG/DOSE) 8 MG/3ML ~~LOC~~ SOPN: 2.0000 mg | PEN_INJECTOR | SUBCUTANEOUS | 5 refills | Status: DC

## 2021-11-30 MED ORDER — METFORMIN HCL ER 750 MG PO TB24: 750.0000 mg | ORAL_TABLET | Freq: Every day | ORAL | 3 refills | Status: DC

## 2021-11-30 NOTE — Telephone Encounter (Signed)
Pharmacy comment: Script Clarification:PLEASE CLARIFY IF SIG IS ONE DAILY WITH BREAKFAST OR 1 TAB TWICE DAILY; 2 SETS OF DIRECTIONS IN SIG CODE; PLEASE CONFIRM QTY ALSO.

## 2021-11-30 NOTE — Telephone Encounter (Signed)
I went ahead and authorized the 2 mg dose of ozempic. To avoid having glucose drop too low, he should decrease the metformin to one a day.

## 2021-12-01 ENCOUNTER — Ambulatory Visit: Payer: BC Managed Care – PPO | Admitting: Family Medicine

## 2021-12-10 ENCOUNTER — Ambulatory Visit: Payer: BC Managed Care – PPO | Admitting: Family Medicine

## 2021-12-10 ENCOUNTER — Encounter: Payer: Self-pay | Admitting: Family Medicine

## 2021-12-10 VITALS — BP 134/90 | HR 71 | Temp 97.7°F | Ht 70.0 in | Wt 235.4 lb

## 2021-12-10 DIAGNOSIS — I10 Essential (primary) hypertension: Secondary | ICD-10-CM | POA: Diagnosis not present

## 2021-12-10 DIAGNOSIS — I471 Supraventricular tachycardia, unspecified: Secondary | ICD-10-CM

## 2021-12-10 DIAGNOSIS — K219 Gastro-esophageal reflux disease without esophagitis: Secondary | ICD-10-CM

## 2021-12-10 DIAGNOSIS — G4701 Insomnia due to medical condition: Secondary | ICD-10-CM

## 2021-12-10 DIAGNOSIS — E7800 Pure hypercholesterolemia, unspecified: Secondary | ICD-10-CM | POA: Diagnosis not present

## 2021-12-10 DIAGNOSIS — K580 Irritable bowel syndrome with diarrhea: Secondary | ICD-10-CM

## 2021-12-10 DIAGNOSIS — E1165 Type 2 diabetes mellitus with hyperglycemia: Secondary | ICD-10-CM | POA: Diagnosis not present

## 2021-12-10 LAB — BAYER DCA HB A1C WAIVED: HB A1C (BAYER DCA - WAIVED): 6.2 % — ABNORMAL HIGH (ref 4.8–5.6)

## 2021-12-10 NOTE — Progress Notes (Signed)
Subjective:  Patient ID: Brian Roach, male    DOB: 09/04/1960  Age: 61 y.o. MRN: [id number]  CC: Medical Management of Chronic Issues   HPI Brian CAPPIELLO presents forFollow-up of diabetes. Patient checks blood sugar at home.   100 fasting and 100 postprandial Patient denies symptoms such as polyuria, polydipsia, excessive hunger, nausea No significant hypoglycemic spells noted. Medications reviewed. Pt reports taking them regularly without complication/adverse reaction being reported today.      History Brian Roach has a past medical history of Diabetes mellitus without complication ([place]), Hyperlipidemia, Hypertension, PE (pulmonary embolism), and PONV (postoperative nausea and vomiting).   He has a past surgical history that includes Back surgery; Knee surgery; Lung surgery; Anterior cruciate ligament repair (Right); Wisdom tooth extraction; Quadriceps tendon repair (Left, 09/23/2016); Colonoscopy with propofol (N/A, 04/20/2021); and Cataract extraction (Right).   His family history includes Heart disease in his brother and father.He reports that he has never smoked. He has never used smokeless tobacco. He reports that he does not drink alcohol and does not use drugs.  Current Outpatient Medications on File Prior to Visit  Medication Sig Dispense Refill   benazepril (LOTENSIN) 20 MG tablet TAKE 1 TABLET BY MOUTH EVERY DAY 90 tablet 0   cetirizine (ZYRTEC) 10 MG tablet TAKE 1 TABLET (10 MG TOTAL) BY MOUTH DAILY. FOR ALLERGY SYMPTOMS 90 tablet 3   cyclobenzaprine (FLEXERIL) 10 MG tablet TAKE 1 TABLET BY MOUTH THREE TIMES A DAY AS NEEDED FOR MUSCLE SPASMS 90 tablet 5   fluticasone (FLONASE) 50 MCG/ACT nasal spray SPRAY 2 SPRAYS INTO EACH NOSTRIL EVERY DAY 48 mL 1   metFORMIN (GLUCOPHAGE-XR) 750 MG 24 hr tablet Take 1 tablet (750 mg total) by mouth daily with breakfast. TAKE 1 TABLET BY MOUTH WITH BREAKFAST AND SUPPER 90 tablet 3   Multiple Vitamins-Minerals (ONE-A-DAY MENS HEALTH  FORMULA PO) Take 1 tablet by mouth daily.     Omega-3 Fatty Acids (FISH OIL) 1000 MG CAPS Take 1,000 mg by mouth in the morning and at bedtime.     ondansetron (ZOFRAN) 4 MG tablet TAKE 1 TABLET BY MOUTH EVERY 8 HOURS AS NEEDED FOR NAUSEA AND VOMITING 18 tablet 3   Semaglutide, 2 MG/DOSE, (OZEMPIC, 2 MG/DOSE,) 8 MG/3ML SOPN Inject 2 mg into the skin once a week. 3 mL 5   triamcinolone cream (KENALOG) 0.1 % Apply 1 Application topically 3 (three) times daily. Avoid face and genitalia 15 g 0   No current facility-administered medications on file prior to visit.    ROS Review of Systems  Constitutional:  Negative for fever.  Respiratory:  Negative for shortness of breath.   Cardiovascular:  Negative for chest pain.  Musculoskeletal:  Negative for arthralgias.  Skin:  Negative for rash.    Objective:  BP (!) 134/90   Pulse 71   Temp 97.7 F (36.5 C)   Ht $R'5\' 10"'xk$  (1.778 m)   Wt 235 lb 6.4 oz (106.8 kg)   SpO2 95%   BMI 33.78 kg/m   BP Readings from Last 3 Encounters:  12/10/21 (!) 134/90  08/24/21 125/73  05/20/21 123/72    Wt Readings from Last 3 Encounters:  12/10/21 235 lb 6.4 oz (106.8 kg)  08/24/21 243 lb 6.4 oz (110.4 kg)  05/20/21 249 lb 9.6 oz (113.2 kg)     Physical Exam Vitals reviewed.  Constitutional:      Appearance: He is well-developed.  HENT:     Head: Normocephalic and atraumatic.  Right Ear: External ear normal.     Left Ear: External ear normal.     Mouth/Throat:     Pharynx: No oropharyngeal exudate or posterior oropharyngeal erythema.  Eyes:     Pupils: Pupils are equal, round, and reactive to light.  Cardiovascular:     Rate and Rhythm: Normal rate and regular rhythm.     Heart sounds: No murmur heard. Pulmonary:     Effort: No respiratory distress.     Breath sounds: Normal breath sounds.  Musculoskeletal:     Cervical back: Normal range of motion and neck supple.  Neurological:     Mental Status: He is alert and oriented to person,  place, and time.       Assessment & Plan:   Neco was seen today for medical management of chronic issues.  Diagnoses and all orders for this visit:  Uncontrolled type 2 diabetes mellitus with hyperglycemia (HCC) -     Bayer DCA Hb A1c Waived -     atorvastatin (LIPITOR) 20 MG tablet; TAKE 1 TABLET BY MOUTH EVERY DAY IN THE EVENING -     Microalbumin / creatinine urine ratio  HTN (hypertension), benign -     CBC with Differential/Platelet -     CMP14+EGFR -     diltiazem (CARDIZEM CD) 360 MG 24 hr capsule; TAKE 1 CAPSULE BY MOUTH EVERY DAY  Hypercholesteremia -     Lipid panel -     atorvastatin (LIPITOR) 20 MG tablet; TAKE 1 TABLET BY MOUTH EVERY DAY IN THE EVENING  Paroxysmal SVT (supraventricular tachycardia) -     diltiazem (CARDIZEM CD) 360 MG 24 hr capsule; TAKE 1 CAPSULE BY MOUTH EVERY DAY  Irritable bowel syndrome with diarrhea -     linaclotide (LINZESS) 290 MCG CAPS capsule; TAKE 1 CAPSULE (290 MCG TOTAL) BY MOUTH DAILY. TO REGULATE BOWEL MOVEMENTS  Gastroesophageal reflux disease without esophagitis -     omeprazole (PRILOSEC) 20 MG capsule; TAKE 1 CAPSULE BY MOUTH EVERY DAY  Insomnia due to medical condition -     trazodone (DESYREL) 300 MG tablet; TAKE 1 TABLET BY MOUTH EVERYDAY AT BEDTIME  Other orders -     rivaroxaban (XARELTO) 20 MG TABS tablet; Take 1 tablet (20 mg total) by mouth daily with supper.      I am having [name] Presume "Brian Roach" maintain his Multiple Vitamins-Minerals (ONE-A-DAY MENS HEALTH FORMULA PO), fluticasone, cetirizine, Fish Oil, cyclobenzaprine, triamcinolone cream, ondansetron, benazepril, Ozempic (2 MG/DOSE), metFORMIN, atorvastatin, diltiazem, linaclotide, omeprazole, rivaroxaban, and trazodone.  Meds ordered this encounter  Medications   atorvastatin (LIPITOR) 20 MG tablet    Sig: TAKE 1 TABLET BY MOUTH EVERY DAY IN THE EVENING    Dispense:  90 tablet    Refill:  3   diltiazem (CARDIZEM CD) 360 MG 24 hr capsule     Sig: TAKE 1 CAPSULE BY MOUTH EVERY DAY    Dispense:  90 capsule    Refill:  3   linaclotide (LINZESS) 290 MCG CAPS capsule    Sig: TAKE 1 CAPSULE (290 MCG TOTAL) BY MOUTH DAILY. TO REGULATE BOWEL MOVEMENTS    Dispense:  90 capsule    Refill:  3   omeprazole (PRILOSEC) 20 MG capsule    Sig: TAKE 1 CAPSULE BY MOUTH EVERY DAY    Dispense:  90 capsule    Refill:  3   rivaroxaban (XARELTO) 20 MG TABS tablet    Sig: Take 1 tablet (20 mg total) by mouth daily  with supper.    Dispense:  90 tablet    Refill:  3   trazodone (DESYREL) 300 MG tablet    Sig: TAKE 1 TABLET BY MOUTH EVERYDAY AT BEDTIME    Dispense:  90 tablet    Refill:  3     Follow-up: Return in about 3 months (around 03/12/2022).  Claretta Fraise, M.D.

## 2021-12-11 LAB — CBC WITH DIFFERENTIAL/PLATELET
Basophils Absolute: 0.1 10*3/uL (ref 0.0–0.2)
Basos: 1 %
EOS (ABSOLUTE): 0.3 10*3/uL (ref 0.0–0.4)
Eos: 3 %
Hematocrit: 46.1 % (ref 37.5–51.0)
Hemoglobin: 14.9 g/dL (ref 13.0–17.7)
Immature Grans (Abs): 0.1 10*3/uL (ref 0.0–0.1)
Immature Granulocytes: 1 %
Lymphocytes Absolute: 2.6 10*3/uL (ref 0.7–3.1)
Lymphs: 23 %
MCH: 30.2 pg (ref 26.6–33.0)
MCHC: 32.3 g/dL (ref 31.5–35.7)
MCV: 94 fL (ref 79–97)
Monocytes Absolute: 0.9 10*3/uL (ref 0.1–0.9)
Monocytes: 9 %
Neutrophils Absolute: 7.1 10*3/uL — ABNORMAL HIGH (ref 1.4–7.0)
Neutrophils: 63 %
Platelets: 285 10*3/uL (ref 150–450)
RBC: 4.93 x10E6/uL (ref 4.14–5.80)
RDW: 13.1 % (ref 11.6–15.4)
WBC: 11 10*3/uL — ABNORMAL HIGH (ref 3.4–10.8)

## 2021-12-11 LAB — LIPID PANEL
Chol/HDL Ratio: 2.7 ratio (ref 0.0–5.0)
Cholesterol, Total: 106 mg/dL (ref 100–199)
HDL: 40 mg/dL (ref >39)
LDL Chol Calc (NIH): 54 mg/dL (ref 0–99)
Triglycerides: 49 mg/dL (ref 0–149)
VLDL Cholesterol Cal: 12 mg/dL (ref 5–40)

## 2021-12-11 LAB — CMP14+EGFR
ALT: 26 IU/L (ref 0–44)
AST: 19 IU/L (ref 0–40)
Albumin/Globulin Ratio: 1.7 (ref 1.2–2.2)
Albumin: 4.5 g/dL (ref 3.9–4.9)
Alkaline Phosphatase: 77 IU/L (ref 44–121)
BUN/Creatinine Ratio: 10 (ref 10–24)
BUN: 9 mg/dL (ref 8–27)
Bilirubin Total: 0.4 mg/dL (ref 0.0–1.2)
CO2: 24 mmol/L (ref 20–29)
Calcium: 9.7 mg/dL (ref 8.6–10.2)
Chloride: 102 mmol/L (ref 96–106)
Creatinine, Ser: 0.94 mg/dL (ref 0.76–1.27)
Globulin, Total: 2.6 g/dL (ref 1.5–4.5)
Glucose: 112 mg/dL — ABNORMAL HIGH (ref 70–99)
Potassium: 4.8 mmol/L (ref 3.5–5.2)
Sodium: 140 mmol/L (ref 134–144)
Total Protein: 7.1 g/dL (ref 6.0–8.5)
eGFR: 91 mL/min/{1.73_m2} (ref >59)

## 2021-12-11 LAB — MICROALBUMIN / CREATININE URINE RATIO
Creatinine, Urine: 213.5 mg/dL
Microalb/Creat Ratio: 20 mg/g creat (ref 0–29)
Microalbumin, Urine: 41.7 ug/mL

## 2021-12-12 ENCOUNTER — Encounter: Payer: Self-pay | Admitting: Family Medicine

## 2021-12-12 MED ORDER — RIVAROXABAN 20 MG PO TABS: 20.0000 mg | ORAL_TABLET | Freq: Every day | ORAL | 3 refills | Status: DC

## 2021-12-12 MED ORDER — DILTIAZEM HCL ER COATED BEADS 360 MG PO CP24: ORAL_CAPSULE | ORAL | 3 refills | Status: DC

## 2021-12-12 MED ORDER — OMEPRAZOLE 20 MG PO CPDR: DELAYED_RELEASE_CAPSULE | ORAL | 3 refills | Status: DC

## 2021-12-12 MED ORDER — ATORVASTATIN CALCIUM 20 MG PO TABS: ORAL_TABLET | ORAL | 3 refills | Status: DC

## 2021-12-12 MED ORDER — TRAZODONE HCL 300 MG PO TABS: ORAL_TABLET | ORAL | 3 refills | Status: DC

## 2021-12-12 MED ORDER — LINACLOTIDE 290 MCG PO CAPS: ORAL_CAPSULE | ORAL | 3 refills | Status: DC

## 2021-12-12 NOTE — Progress Notes (Signed)
Hello Eaton,  Your lab result is normal and/or stable.Some minor variations that are not significant are commonly marked abnormal, but do not represent any medical problem for you.  Best regards, Zakariah Urwin, M.D.

## 2021-12-29 ENCOUNTER — Other Ambulatory Visit: Payer: Self-pay | Admitting: Family Medicine

## 2022-01-22 ENCOUNTER — Other Ambulatory Visit: Payer: Self-pay | Admitting: Family Medicine

## 2022-01-22 DIAGNOSIS — E1165 Type 2 diabetes mellitus with hyperglycemia: Secondary | ICD-10-CM

## 2022-01-24 ENCOUNTER — Other Ambulatory Visit: Payer: Self-pay | Admitting: Family Medicine

## 2022-01-24 DIAGNOSIS — E1165 Type 2 diabetes mellitus with hyperglycemia: Secondary | ICD-10-CM

## 2022-01-25 ENCOUNTER — Other Ambulatory Visit: Payer: Self-pay | Admitting: Family Medicine

## 2022-01-25 DIAGNOSIS — E1165 Type 2 diabetes mellitus with hyperglycemia: Secondary | ICD-10-CM

## 2022-01-25 MED ORDER — METFORMIN HCL ER 750 MG PO TB24: 750.0000 mg | ORAL_TABLET | Freq: Every day | ORAL | 3 refills | Status: DC

## 2022-01-25 NOTE — Telephone Encounter (Signed)
Please clarify directions on medication

## 2022-01-29 ENCOUNTER — Other Ambulatory Visit: Payer: Self-pay | Admitting: Family Medicine

## 2022-02-14 ENCOUNTER — Other Ambulatory Visit: Payer: Self-pay | Admitting: Family Medicine

## 2022-02-14 DIAGNOSIS — E1165 Type 2 diabetes mellitus with hyperglycemia: Secondary | ICD-10-CM

## 2022-02-14 DIAGNOSIS — I10 Essential (primary) hypertension: Secondary | ICD-10-CM

## 2022-03-05 ENCOUNTER — Other Ambulatory Visit: Payer: Self-pay | Admitting: Family Medicine

## 2022-03-15 ENCOUNTER — Ambulatory Visit: Payer: BC Managed Care – PPO | Admitting: Family Medicine

## 2022-03-15 ENCOUNTER — Encounter: Payer: Self-pay | Admitting: Family Medicine

## 2022-03-15 VITALS — BP 117/65 | HR 76 | Temp 97.6°F | Ht 70.0 in | Wt 234.0 lb

## 2022-03-15 DIAGNOSIS — E1165 Type 2 diabetes mellitus with hyperglycemia: Secondary | ICD-10-CM | POA: Diagnosis not present

## 2022-03-15 DIAGNOSIS — E7800 Pure hypercholesterolemia, unspecified: Secondary | ICD-10-CM | POA: Diagnosis not present

## 2022-03-15 DIAGNOSIS — I10 Essential (primary) hypertension: Secondary | ICD-10-CM

## 2022-03-15 LAB — BAYER DCA HB A1C WAIVED: HB A1C (BAYER DCA - WAIVED): 6.9 % — ABNORMAL HIGH (ref 4.8–5.6)

## 2022-03-15 NOTE — Progress Notes (Signed)
Subjective:  Patient ID: Brian Roach, male    DOB: 10/03/1960  Age: 62 y.o. MRN: [id number]  CC: Medical Management of Chronic Issues   HPI Brian Roach presents forFollow-up of diabetes. Patient checks blood sugar at home.   100 fasting and 130 postprandial  Patient denies symptoms such as polyuria, polydipsia, excessive hunger, nausea No significant hypoglycemic spells noted. Medications reviewed. Couldn't get ozempic until a month ago. Went down to one metfrormin while the ozempic was unavailable.  Pt reports taking them regularly without complication/adverse reaction being reported today.   presents for  follow-up of hypertension. Patient has no history of headache chest pain or shortness of breath or recent cough. Patient also denies symptoms of TIA such as focal numbness or weakness. Patient denies side effects from medication. States taking it regularly. BP under 120 /80 at home also  History Brian Roach has a past medical history of Diabetes mellitus without complication ([place]), Hyperlipidemia, Hypertension, PE (pulmonary embolism), and PONV (postoperative nausea and vomiting).   He has a past surgical history that includes Back surgery; Knee surgery; Lung surgery; Anterior cruciate ligament repair (Right); Wisdom tooth extraction; Quadriceps tendon repair (Left, 09/23/2016); Colonoscopy with propofol (N/A, 04/20/2021); and Cataract extraction (Right).   His family history includes Heart disease in his brother and father.He reports that he has never smoked. He has never used smokeless tobacco. He reports that he does not drink alcohol and does not use drugs.  Current Outpatient Medications on File Prior to Visit  Medication Sig Dispense Refill   atorvastatin (LIPITOR) 20 MG tablet TAKE 1 TABLET BY MOUTH EVERY DAY IN THE EVENING 90 tablet 3   benazepril (LOTENSIN) 20 MG tablet TAKE 1 TABLET BY MOUTH EVERY DAY 90 tablet 0   cetirizine (ZYRTEC) 10 MG tablet TAKE 1 TABLET (10 MG  TOTAL) BY MOUTH DAILY. FOR ALLERGY SYMPTOMS 90 tablet 3   cyclobenzaprine (FLEXERIL) 10 MG tablet TAKE 1 TABLET BY MOUTH THREE TIMES A DAY AS NEEDED FOR MUSCLE SPASM 90 tablet 1   diltiazem (CARDIZEM CD) 360 MG 24 hr capsule TAKE 1 CAPSULE BY MOUTH EVERY DAY 90 capsule 3   fluticasone (FLONASE) 50 MCG/ACT nasal spray SPRAY 2 SPRAYS INTO EACH NOSTRIL EVERY DAY 48 mL 1   linaclotide (LINZESS) 290 MCG CAPS capsule TAKE 1 CAPSULE (290 MCG TOTAL) BY MOUTH DAILY. TO REGULATE BOWEL MOVEMENTS 90 capsule 3   metFORMIN (GLUCOPHAGE-XR) 750 MG 24 hr tablet Take 1 tablet (750 mg total) by mouth daily with breakfast. 90 tablet 3   Multiple Vitamins-Minerals (ONE-A-DAY MENS HEALTH FORMULA PO) Take 1 tablet by mouth daily.     Omega-3 Fatty Acids (FISH OIL) 1000 MG CAPS Take 1,000 mg by mouth in the morning and at bedtime.     omeprazole (PRILOSEC) 20 MG capsule TAKE 1 CAPSULE BY MOUTH EVERY DAY 90 capsule 3   rivaroxaban (XARELTO) 20 MG TABS tablet Take 1 tablet (20 mg total) by mouth daily with supper. 90 tablet 3   Semaglutide, 2 MG/DOSE, (OZEMPIC, 2 MG/DOSE,) 8 MG/3ML SOPN Inject 2 mg into the skin once a week. 3 mL 5   trazodone (DESYREL) 300 MG tablet TAKE 1 TABLET BY MOUTH EVERYDAY AT BEDTIME 90 tablet 3   triamcinolone cream (KENALOG) 0.1 % Apply 1 Application topically 3 (three) times daily. Avoid face and genitalia 15 g 0   No current facility-administered medications on file prior to visit.    ROS Review of Systems  Constitutional:  Negative for fever.  Respiratory:  Negative for shortness of breath.   Cardiovascular:  Negative for chest pain.  Musculoskeletal:  Negative for arthralgias.  Skin:  Negative for rash.    Objective:  BP 117/65   Pulse 76   Temp 97.6 F (36.4 C)   Ht 5\' 10"  (1.778 m)   Wt 234 lb (106.1 kg)   SpO2 96%   BMI 33.58 kg/m   BP Readings from Last 3 Encounters:  03/15/22 117/65  12/10/21 (!) 134/90  08/24/21 125/73    Wt Readings from Last 3 Encounters:   03/15/22 234 lb (106.1 kg)  12/10/21 235 lb 6.4 oz (106.8 kg)  08/24/21 243 lb 6.4 oz (110.4 kg)     Physical Exam Vitals reviewed.  Constitutional:      Appearance: He is well-developed.  HENT:     Head: Normocephalic and atraumatic.     Right Ear: External ear normal.     Left Ear: External ear normal.     Mouth/Throat:     Pharynx: No oropharyngeal exudate or posterior oropharyngeal erythema.  Eyes:     Pupils: Pupils are equal, round, and reactive to light.  Cardiovascular:     Rate and Rhythm: Normal rate and regular rhythm.     Heart sounds: No murmur heard. Pulmonary:     Effort: No respiratory distress.     Breath sounds: Normal breath sounds.  Musculoskeletal:     Cervical back: Normal range of motion and neck supple.  Neurological:     Mental Status: He is alert and oriented to person, place, and time.       Assessment & Plan:   Brian Roach was seen today for medical management of chronic issues.  Diagnoses and all orders for this visit:  Uncontrolled type 2 diabetes mellitus with hyperglycemia ([place]) -     Bayer DCA Hb A1c Waived  HTN (hypertension), benign -     CBC with Differential/Platelet -     CMP14+EGFR  Hypercholesteremia -     Lipid panel      I have discontinued Brian Roach "Brian Roach"'s ondansetron. I am also having him maintain his Multiple Vitamins-Minerals (ONE-A-DAY MENS HEALTH FORMULA PO), fluticasone, cetirizine, Fish Oil, triamcinolone cream, Ozempic (2 MG/DOSE), atorvastatin, diltiazem, linaclotide, omeprazole, rivaroxaban, trazodone, metFORMIN, benazepril, and cyclobenzaprine.  No orders of the defined types were placed in this encounter.    Follow-up: Return in about 3 months (around 06/14/2022).  Claretta Fraise, M.D.

## 2022-03-16 LAB — CBC WITH DIFFERENTIAL/PLATELET
Basophils Absolute: 0 10*3/uL (ref 0.0–0.2)
Basos: 1 %
EOS (ABSOLUTE): 0.4 10*3/uL (ref 0.0–0.4)
Eos: 5 %
Hematocrit: 44.8 % (ref 37.5–51.0)
Hemoglobin: 14.7 g/dL (ref 13.0–17.7)
Immature Grans (Abs): 0 10*3/uL (ref 0.0–0.1)
Immature Granulocytes: 0 %
Lymphocytes Absolute: 2.2 10*3/uL (ref 0.7–3.1)
Lymphs: 32 %
MCH: 29.5 pg (ref 26.6–33.0)
MCHC: 32.8 g/dL (ref 31.5–35.7)
MCV: 90 fL (ref 79–97)
Monocytes Absolute: 0.6 10*3/uL (ref 0.1–0.9)
Monocytes: 9 %
Neutrophils Absolute: 3.7 10*3/uL (ref 1.4–7.0)
Neutrophils: 53 %
Platelets: 261 10*3/uL (ref 150–450)
RBC: 4.98 x10E6/uL (ref 4.14–5.80)
RDW: 12.3 % (ref 11.6–15.4)
WBC: 6.9 10*3/uL (ref 3.4–10.8)

## 2022-03-16 LAB — CMP14+EGFR
ALT: 29 IU/L (ref 0–44)
AST: 23 IU/L (ref 0–40)
Albumin/Globulin Ratio: 1.7 (ref 1.2–2.2)
Albumin: 4.4 g/dL (ref 3.9–4.9)
Alkaline Phosphatase: 78 IU/L (ref 44–121)
BUN/Creatinine Ratio: 13 (ref 10–24)
BUN: 12 mg/dL (ref 8–27)
Bilirubin Total: 0.4 mg/dL (ref 0.0–1.2)
CO2: 23 mmol/L (ref 20–29)
Calcium: 9.5 mg/dL (ref 8.6–10.2)
Chloride: 101 mmol/L (ref 96–106)
Creatinine, Ser: 0.9 mg/dL (ref 0.76–1.27)
Globulin, Total: 2.6 g/dL (ref 1.5–4.5)
Glucose: 154 mg/dL — ABNORMAL HIGH (ref 70–99)
Potassium: 4.8 mmol/L (ref 3.5–5.2)
Sodium: 141 mmol/L (ref 134–144)
Total Protein: 7 g/dL (ref 6.0–8.5)
eGFR: 96 mL/min/{1.73_m2} (ref >59)

## 2022-03-16 LAB — LIPID PANEL
Chol/HDL Ratio: 2.7 ratio (ref 0.0–5.0)
Cholesterol, Total: 106 mg/dL (ref 100–199)
HDL: 40 mg/dL (ref >39)
LDL Chol Calc (NIH): 53 mg/dL (ref 0–99)
Triglycerides: 57 mg/dL (ref 0–149)
VLDL Cholesterol Cal: 13 mg/dL (ref 5–40)

## 2022-03-16 NOTE — Progress Notes (Signed)
Hello Brian Roach,  Your lab result is normal and/or stable.Some minor variations that are not significant are commonly marked abnormal, but do not represent any medical problem for you.  Best regards, Claretta Fraise, M.D.

## 2022-04-01 ENCOUNTER — Other Ambulatory Visit: Payer: Self-pay | Admitting: Family Medicine

## 2022-05-04 ENCOUNTER — Other Ambulatory Visit: Payer: Self-pay | Admitting: Family Medicine

## 2022-05-04 DIAGNOSIS — J0110 Acute frontal sinusitis, unspecified: Secondary | ICD-10-CM

## 2022-05-22 ENCOUNTER — Other Ambulatory Visit: Payer: Self-pay | Admitting: Family Medicine

## 2022-05-22 DIAGNOSIS — I10 Essential (primary) hypertension: Secondary | ICD-10-CM

## 2022-05-22 DIAGNOSIS — E1165 Type 2 diabetes mellitus with hyperglycemia: Secondary | ICD-10-CM

## 2022-06-20 ENCOUNTER — Other Ambulatory Visit: Payer: Self-pay | Admitting: Family Medicine

## 2022-06-21 ENCOUNTER — Encounter: Payer: Self-pay | Admitting: Family Medicine

## 2022-06-21 ENCOUNTER — Ambulatory Visit: Payer: BC Managed Care – PPO | Admitting: Family Medicine

## 2022-06-21 VITALS — BP 112/66 | HR 78 | Temp 98.0°F | Ht 70.0 in | Wt 234.6 lb

## 2022-06-21 DIAGNOSIS — I10 Essential (primary) hypertension: Secondary | ICD-10-CM

## 2022-06-21 DIAGNOSIS — E1165 Type 2 diabetes mellitus with hyperglycemia: Secondary | ICD-10-CM

## 2022-06-21 DIAGNOSIS — E7800 Pure hypercholesterolemia, unspecified: Secondary | ICD-10-CM | POA: Diagnosis not present

## 2022-06-21 LAB — CMP14+EGFR
ALT: 23 IU/L (ref 0–44)
AST: 20 IU/L (ref 0–40)
Albumin/Globulin Ratio: 1.8 (ref 1.2–2.2)
Albumin: 4.4 g/dL (ref 3.9–4.9)
Alkaline Phosphatase: 95 IU/L (ref 44–121)
BUN/Creatinine Ratio: 13 (ref 10–24)
BUN: 13 mg/dL (ref 8–27)
Bilirubin Total: 0.4 mg/dL (ref 0.0–1.2)
CO2: 25 mmol/L (ref 20–29)
Calcium: 9.6 mg/dL (ref 8.6–10.2)
Chloride: 102 mmol/L (ref 96–106)
Creatinine, Ser: 1.02 mg/dL (ref 0.76–1.27)
Globulin, Total: 2.5 g/dL (ref 1.5–4.5)
Glucose: 122 mg/dL — ABNORMAL HIGH (ref 70–99)
Potassium: 5 mmol/L (ref 3.5–5.2)
Sodium: 140 mmol/L (ref 134–144)
Total Protein: 6.9 g/dL (ref 6.0–8.5)
eGFR: 83 mL/min/{1.73_m2} (ref >59)

## 2022-06-21 LAB — LIPID PANEL
Chol/HDL Ratio: 2.7 ratio (ref 0.0–5.0)
Cholesterol, Total: 112 mg/dL (ref 100–199)
HDL: 42 mg/dL (ref >39)
LDL Chol Calc (NIH): 57 mg/dL (ref 0–99)
Triglycerides: 57 mg/dL (ref 0–149)
VLDL Cholesterol Cal: 13 mg/dL (ref 5–40)

## 2022-06-21 LAB — CBC WITH DIFFERENTIAL/PLATELET
Basophils Absolute: 0.1 10*3/uL (ref 0.0–0.2)
Basos: 1 %
EOS (ABSOLUTE): 1.7 10*3/uL — ABNORMAL HIGH (ref 0.0–0.4)
Eos: 18 %
Hematocrit: 44.3 % (ref 37.5–51.0)
Hemoglobin: 14.9 g/dL (ref 13.0–17.7)
Immature Grans (Abs): 0 10*3/uL (ref 0.0–0.1)
Immature Granulocytes: 0 %
Lymphocytes Absolute: 2.4 10*3/uL (ref 0.7–3.1)
Lymphs: 27 %
MCH: 30.7 pg (ref 26.6–33.0)
MCHC: 33.6 g/dL (ref 31.5–35.7)
MCV: 91 fL (ref 79–97)
Monocytes Absolute: 0.7 10*3/uL (ref 0.1–0.9)
Monocytes: 8 %
Neutrophils Absolute: 4.2 10*3/uL (ref 1.4–7.0)
Neutrophils: 46 %
Platelets: 251 10*3/uL (ref 150–450)
RBC: 4.85 x10E6/uL (ref 4.14–5.80)
RDW: 13.1 % (ref 11.6–15.4)
WBC: 9.1 10*3/uL (ref 3.4–10.8)

## 2022-06-21 LAB — BAYER DCA HB A1C WAIVED: HB A1C (BAYER DCA - WAIVED): 6.4 % — ABNORMAL HIGH (ref 4.8–5.6)

## 2022-06-21 MED ORDER — BENAZEPRIL HCL 20 MG PO TABS: 20.0000 mg | ORAL_TABLET | Freq: Every day | ORAL | 3 refills | Status: DC

## 2022-06-21 NOTE — Progress Notes (Signed)
Subjective:  Patient ID: Brian Roach,  male    DOB: 04/19/1960  Age: 62 y.o.    CC: Medical Management of Chronic Issues   HPI Brian Roach presents for  follow-up of hypertension. Patient has no history of headache chest pain or shortness of breath or recent cough. Patient also denies symptoms of TIA such as numbness weakness lateralizing. Patient denies side effects from medication. States taking it regularly.  Patient also  in for follow-up of elevated cholesterol. Doing well without complaints on current medication. Denies side effects  including myalgia and arthralgia and nausea. Also in today for liver function testing. Currently no chest pain, shortness of breath or other cardiovascular related symptoms noted.  Follow-up of diabetes. Patient does check blood sugar at home. Readings run between 100 and 150 Patient denies symptoms such as excessive hunger or urinary frequency, excessive hunger, nausea No significant hypoglycemic spells noted. Medications reviewed. Pt reports taking them regularly. Pt. denies complication/adverse reaction today.    History Brian Roach has a past medical history of Diabetes mellitus without complication, Hyperlipidemia, Hypertension, PE (pulmonary embolism), and PONV (postoperative nausea and vomiting).   He has a past surgical history that includes Back surgery; Knee surgery; Lung surgery; Anterior cruciate ligament repair (Right); Wisdom tooth extraction; Quadriceps tendon repair (Left, 09/23/2016); Colonoscopy with propofol (N/A, 04/20/2021); and Cataract extraction (Right).   His family history includes Heart disease in his brother and father.He reports that he has never smoked. He has never used smokeless tobacco. He reports that he does not drink alcohol and does not use drugs.  Current Outpatient Medications on File Prior to Visit  Medication Sig Dispense Refill   atorvastatin (LIPITOR) 20 MG tablet TAKE 1 TABLET BY MOUTH EVERY DAY IN THE  EVENING 90 tablet 3   cetirizine (ZYRTEC) 10 MG tablet TAKE 1 TABLET (10 MG TOTAL) BY MOUTH DAILY. FOR ALLERGY SYMPTOMS 90 tablet 3   cyclobenzaprine (FLEXERIL) 10 MG tablet TAKE 1 TABLET BY MOUTH THREE TIMES A DAY AS NEEDED FOR MUSCLE SPASM 90 tablet 1   diltiazem (CARDIZEM CD) 360 MG 24 hr capsule TAKE 1 CAPSULE BY MOUTH EVERY DAY 90 capsule 3   fluticasone (FLONASE) 50 MCG/ACT nasal spray SPRAY 2 SPRAYS INTO EACH NOSTRIL EVERY DAY 48 mL 1   linaclotide (LINZESS) 290 MCG CAPS capsule TAKE 1 CAPSULE (290 MCG TOTAL) BY MOUTH DAILY. TO REGULATE BOWEL MOVEMENTS 90 capsule 3   metFORMIN (GLUCOPHAGE-XR) 750 MG 24 hr tablet Take 1 tablet (750 mg total) by mouth daily with breakfast. 90 tablet 3   Multiple Vitamins-Minerals (ONE-A-DAY MENS HEALTH FORMULA PO) Take 1 tablet by mouth daily.     Omega-3 Fatty Acids (FISH OIL) 1000 MG CAPS Take 1,000 mg by mouth in the morning and at bedtime.     omeprazole (PRILOSEC) 20 MG capsule TAKE 1 CAPSULE BY MOUTH EVERY DAY 90 capsule 3   rivaroxaban (XARELTO) 20 MG TABS tablet Take 1 tablet (20 mg total) by mouth daily with supper. 90 tablet 3   Semaglutide, 2 MG/DOSE, (OZEMPIC, 2 MG/DOSE,) 8 MG/3ML SOPN Inject 2 mg into the skin once a week. 3 mL 5   trazodone (DESYREL) 300 MG tablet TAKE 1 TABLET BY MOUTH EVERYDAY AT BEDTIME 90 tablet 3   triamcinolone cream (KENALOG) 0.1 % Apply 1 Application topically 3 (three) times daily. Avoid face and genitalia 15 g 0   No current facility-administered medications on file prior to visit.    ROS Review of Systems  Constitutional:  Negative for fever.  Respiratory:  Negative for shortness of breath.   Cardiovascular:  Negative for chest pain.  Musculoskeletal:  Negative for arthralgias.  Skin:  Negative for rash.    Objective:  BP 112/66   Pulse 78   Temp 98 F (36.7 C)   Ht  (1.778 m)   Wt 234 lb 9.6 oz (106.4 kg)   SpO2 95%   BMI 33.66 kg/m   BP Readings from Last 3 Encounters:  06/21/22 112/66   03/15/22 117/65  12/10/21 (!) 134/90    Wt Readings from Last 3 Encounters:  06/21/22 234 lb 9.6 oz (106.4 kg)  03/15/22 234 lb (106.1 kg)  12/10/21 235 lb 6.4 oz (106.8 kg)     Physical Exam Vitals reviewed.  Constitutional:      Appearance: He is well-developed.  HENT:     Head: Normocephalic and atraumatic.     Right Ear: External ear normal.     Left Ear: External ear normal.     Mouth/Throat:     Pharynx: No oropharyngeal exudate or posterior oropharyngeal erythema.  Eyes:     Pupils: Pupils are equal, round, and reactive to light.  Cardiovascular:     Rate and Rhythm: Normal rate and regular rhythm.     Heart sounds: No murmur heard. Pulmonary:     Effort: No respiratory distress.     Breath sounds: Normal breath sounds.  Musculoskeletal:     Cervical back: Normal range of motion and neck supple.  Neurological:     Mental Status: He is alert and oriented to person, place, and time.     Diabetic Foot Exam - Simple   Simple Foot Form Diabetic Foot exam was performed with the following findings: Yes 06/21/2022  8:31 AM  Visual Inspection No deformities, no ulcerations, no other skin breakdown bilaterally: Yes Sensation Testing Intact to touch and monofilament testing bilaterally: Yes Pulse Check Posterior Tibialis and Dorsalis pulse intact bilaterally: Yes Comments     Lab Results  Component Value Date   HGBA1C 6.9 (H) 03/15/2022   HGBA1C 6.2 (H) 12/10/2021   HGBA1C 6.0 (H) 08/24/2021    Assessment & Plan:   Brian Roach was seen today for medical management of chronic issues.  Diagnoses and all orders for this visit:  Uncontrolled type 2 diabetes mellitus with hyperglycemia -     Bayer DCA Hb A1c Waived -     benazepril (LOTENSIN) 20 MG tablet; Take 1 tablet (20 mg total) by mouth daily.  HTN (hypertension), benign -     CBC with Differential/Platelet -     CMP14+EGFR -     benazepril (LOTENSIN) 20 MG tablet; Take 1 tablet (20 mg total) by mouth  daily.  Hypercholesteremia -     Lipid panel   I have changed Brian Roach "Brian Roach"'s benazepril. I am also having him maintain his Multiple Vitamins-Minerals (ONE-A-DAY MENS HEALTH FORMULA PO), Fish Oil, triamcinolone cream, Ozempic (2 MG/DOSE), atorvastatin, diltiazem, linaclotide, omeprazole, rivaroxaban, trazodone, metFORMIN, cetirizine, fluticasone, and cyclobenzaprine.  Meds ordered this encounter  Medications   benazepril (LOTENSIN) 20 MG tablet    Sig: Take 1 tablet (20 mg total) by mouth daily.    Dispense:  90 tablet    Refill:  3     Follow-up: No follow-ups on file.  [name], M.D.

## 2022-06-22 NOTE — Progress Notes (Signed)
Hello Brian Roach,  Your lab result is normal and/or stable.Some minor variations that are not significant are commonly marked abnormal, but do not represent any medical problem for you.  Best regards, Arnelle Nale, M.D.

## 2022-09-14 ENCOUNTER — Other Ambulatory Visit: Payer: Self-pay | Admitting: Family Medicine

## 2022-09-19 ENCOUNTER — Other Ambulatory Visit: Payer: Self-pay | Admitting: Family Medicine

## 2022-09-22 ENCOUNTER — Encounter: Payer: Self-pay | Admitting: Family Medicine

## 2022-09-22 ENCOUNTER — Ambulatory Visit: Payer: BC Managed Care – PPO | Admitting: Family Medicine

## 2022-09-22 VITALS — BP 112/70 | HR 75 | Temp 97.6°F | Ht 70.0 in | Wt 232.6 lb

## 2022-09-22 DIAGNOSIS — Z125 Encounter for screening for malignant neoplasm of prostate: Secondary | ICD-10-CM

## 2022-09-22 DIAGNOSIS — E7800 Pure hypercholesterolemia, unspecified: Secondary | ICD-10-CM

## 2022-09-22 DIAGNOSIS — E1165 Type 2 diabetes mellitus with hyperglycemia: Secondary | ICD-10-CM | POA: Diagnosis not present

## 2022-09-22 DIAGNOSIS — I10 Essential (primary) hypertension: Secondary | ICD-10-CM | POA: Diagnosis not present

## 2022-09-22 LAB — CBC WITH DIFFERENTIAL/PLATELET
Basos: 1 %
EOS (ABSOLUTE): 0.6 10*3/uL — ABNORMAL HIGH (ref 0.0–0.4)
Eos: 6 %
Hemoglobin: 15.3 g/dL (ref 13.0–17.7)
Immature Grans (Abs): 0 10*3/uL (ref 0.0–0.1)
Immature Granulocytes: 0 %
Lymphocytes Absolute: 2.3 10*3/uL (ref 0.7–3.1)
MCH: 29.8 pg (ref 26.6–33.0)
MCHC: 32.6 g/dL (ref 31.5–35.7)
MCV: 91 fL (ref 79–97)
Monocytes Absolute: 0.8 10*3/uL (ref 0.1–0.9)
Monocytes: 9 %
Neutrophils Absolute: 5.2 10*3/uL (ref 1.4–7.0)
Neutrophils: 59 %
Platelets: 253 10*3/uL (ref 150–450)
RDW: 12.7 % (ref 11.6–15.4)
WBC: 8.9 10*3/uL (ref 3.4–10.8)

## 2022-09-22 LAB — LIPID PANEL

## 2022-09-22 LAB — PSA, TOTAL AND FREE

## 2022-09-22 LAB — CMP14+EGFR

## 2022-09-22 LAB — BAYER DCA HB A1C WAIVED: HB A1C (BAYER DCA - WAIVED): 6.2 % — ABNORMAL HIGH (ref 4.8–5.6)

## 2022-09-22 NOTE — Progress Notes (Signed)
Subjective:  Patient ID: Brian Roach,  male    DOB: 03/17/1960  Age: 62 y.o.    CC: Medical Management of Chronic Issues   HPI Brian Roach presents for  follow-up of hypertension. Patient has no history of headache chest pain or shortness of breath or recent cough. Patient also denies symptoms of TIA such as numbness weakness lateralizing. Patient denies side effects from medication. States taking it regularly.  Patient also  in for follow-up of elevated cholesterol. Doing well without complaints on current medication. Denies side effects  including myalgia and arthralgia and nausea. Also in today for liver function testing. Currently no chest pain, shortness of breath or other cardiovascular related symptoms noted.  Follow-up of diabetes. Patient does check blood sugar at home. Readings run between 80-90 fasting and 120-150 postprandial Patient denies symptoms such as excessive hunger or urinary frequency, excessive hunger, nausea.  No significant hypoglycemic spells noted. Medications reviewed. Pt reports taking them regularly. Pt. denies complication/adverse reaction today.    History Standley has a past medical history of Diabetes mellitus without complication (HCC), Hyperlipidemia, Hypertension, PE (pulmonary embolism), and PONV (postoperative nausea and vomiting).   He has a past surgical history that includes Back surgery; Knee surgery; Lung surgery; Anterior cruciate ligament repair (Right); Wisdom tooth extraction; Quadriceps tendon repair (Left, 09/23/2016); Colonoscopy with propofol (N/A, 04/20/2021); and Cataract extraction (Right).   His family history includes Heart disease in his brother and father.He reports that he has never smoked. He has never used smokeless tobacco. He reports that he does not drink alcohol and does not use drugs.  Current Outpatient Medications on File Prior to Visit  Medication Sig Dispense Refill   atorvastatin (LIPITOR) 20 MG tablet TAKE  1 TABLET BY MOUTH EVERY DAY IN THE EVENING 90 tablet 3   benazepril (LOTENSIN) 20 MG tablet Take 1 tablet (20 mg total) by mouth daily. 90 tablet 3   cetirizine (ZYRTEC) 10 MG tablet TAKE 1 TABLET (10 MG TOTAL) BY MOUTH DAILY. FOR ALLERGY SYMPTOMS 90 tablet 3   cyclobenzaprine (FLEXERIL) 10 MG tablet TAKE 1 TABLET BY MOUTH THREE TIMES A DAY AS NEEDED FOR MUSCLE SPASM 90 tablet 5   diltiazem (CARDIZEM CD) 360 MG 24 hr capsule TAKE 1 CAPSULE BY MOUTH EVERY DAY 90 capsule 3   fluticasone (FLONASE) 50 MCG/ACT nasal spray SPRAY 2 SPRAYS INTO EACH NOSTRIL EVERY DAY 48 mL 1   linaclotide (LINZESS) 290 MCG CAPS capsule TAKE 1 CAPSULE (290 MCG TOTAL) BY MOUTH DAILY. TO REGULATE BOWEL MOVEMENTS 90 capsule 3   metFORMIN (GLUCOPHAGE-XR) 750 MG 24 hr tablet Take 1 tablet (750 mg total) by mouth daily with breakfast. 90 tablet 3   Multiple Vitamins-Minerals (ONE-A-DAY MENS HEALTH FORMULA PO) Take 1 tablet by mouth daily.     Omega-3 Fatty Acids (FISH OIL) 1000 MG CAPS Take 1,000 mg by mouth in the morning and at bedtime.     omeprazole (PRILOSEC) 20 MG capsule TAKE 1 CAPSULE BY MOUTH EVERY DAY 90 capsule 3   rivaroxaban (XARELTO) 20 MG TABS tablet Take 1 tablet (20 mg total) by mouth daily with supper. 90 tablet 3   Semaglutide, 2 MG/DOSE, (OZEMPIC, 2 MG/DOSE,) 8 MG/3ML SOPN INJECT 2 MG INTO THE SKIN ONCE A WEEK. 3 mL 0   trazodone (DESYREL) 300 MG tablet TAKE 1 TABLET BY MOUTH EVERYDAY AT BEDTIME 90 tablet 3   No current facility-administered medications on file prior to visit.    ROS Review of Systems  Constitutional:  Negative for fever.  Respiratory:  Negative for shortness of breath.   Cardiovascular:  Negative for chest pain.  Musculoskeletal:  Negative for arthralgias.  Skin:  Negative for rash.    Objective:  BP 112/70   Pulse 75   Temp 97.6 F (36.4 C)   Ht 5\' 10"  (1.778 m)   Wt 232 lb 9.6 oz (105.5 kg)   SpO2 94%   BMI 33.37 kg/m   BP Readings from Last 3 Encounters:  09/22/22  112/70  06/21/22 112/66  03/15/22 117/65    Wt Readings from Last 3 Encounters:  09/22/22 232 lb 9.6 oz (105.5 kg)  06/21/22 234 lb 9.6 oz (106.4 kg)  03/15/22 234 lb (106.1 kg)     Physical Exam Vitals reviewed.  Constitutional:      Appearance: He is well-developed.  HENT:     Head: Normocephalic and atraumatic.     Right Ear: External ear normal.     Left Ear: External ear normal.     Mouth/Throat:     Pharynx: No oropharyngeal exudate or posterior oropharyngeal erythema.  Eyes:     Pupils: Pupils are equal, round, and reactive to light.  Cardiovascular:     Rate and Rhythm: Normal rate and regular rhythm.     Heart sounds: No murmur heard. Pulmonary:     Effort: No respiratory distress.     Breath sounds: Normal breath sounds.  Musculoskeletal:     Cervical back: Normal range of motion and neck supple.  Neurological:     Mental Status: He is alert and oriented to person, place, and time.     Diabetic Foot Exam - Simple   No data filed     Lab Results  Component Value Date   HGBA1C 6.4 (H) 06/21/2022   HGBA1C 6.9 (H) 03/15/2022   HGBA1C 6.2 (H) 12/10/2021    Assessment & Plan:   Brian Roach" was seen today for medical management of chronic issues.  Diagnoses and all orders for this visit:  Uncontrolled type 2 diabetes mellitus with hyperglycemia (HCC) -     Bayer DCA Hb A1c Waived  HTN (hypertension), benign -     CBC with Differential/Platelet -     CMP14+EGFR  Hypercholesteremia -     Lipid panel  Prostate cancer screening -     PSA, total and free   I have discontinued [name] "Brian Roach"'s triamcinolone cream. I am also having him maintain his Multiple Vitamins-Minerals (ONE-A-DAY MENS HEALTH FORMULA PO), Fish Oil, atorvastatin, diltiazem, linaclotide, omeprazole, rivaroxaban, trazodone, metFORMIN, cetirizine, fluticasone, benazepril, cyclobenzaprine, and Ozempic (2 MG/DOSE).  No orders of the defined types were placed in this  encounter.    Follow-up: No follow-ups on file.  [name], M.D.

## 2022-09-23 LAB — CMP14+EGFR
ALT: 25 IU/L (ref 0–44)
AST: 22 IU/L (ref 0–40)
Alkaline Phosphatase: 67 IU/L (ref 44–121)
Bilirubin Total: 0.5 mg/dL (ref 0.0–1.2)
CO2: 20 mmol/L (ref 20–29)
Calcium: 9.7 mg/dL (ref 8.6–10.2)
Chloride: 101 mmol/L (ref 96–106)
Globulin, Total: 2.5 g/dL (ref 1.5–4.5)
Potassium: 4.4 mmol/L (ref 3.5–5.2)
Sodium: 138 mmol/L (ref 134–144)

## 2022-09-23 LAB — CBC WITH DIFFERENTIAL/PLATELET
Basophils Absolute: 0.1 10*3/uL (ref 0.0–0.2)
Hematocrit: 47 % (ref 37.5–51.0)
Lymphs: 25 %
RBC: 5.14 x10E6/uL (ref 4.14–5.80)

## 2022-09-23 LAB — PSA, TOTAL AND FREE
PSA, Free: 0.55 ng/mL
Prostate Specific Ag, Serum: 1.8 ng/mL (ref 0.0–4.0)

## 2022-09-23 LAB — LIPID PANEL
Chol/HDL Ratio: 3.5 ratio (ref 0.0–5.0)
LDL Chol Calc (NIH): 78 mg/dL (ref 0–99)

## 2022-09-23 NOTE — Progress Notes (Signed)
Hello Takeem,  Your lab result is normal and/or stable.Some minor variations that are not significant are commonly marked abnormal, but do not represent any medical problem for you.  Best regards, Warren Stacks, M.D.

## 2022-10-17 ENCOUNTER — Other Ambulatory Visit: Payer: Self-pay | Admitting: Family Medicine

## 2022-10-22 ENCOUNTER — Other Ambulatory Visit: Payer: Self-pay | Admitting: Family Medicine

## 2022-10-22 DIAGNOSIS — G4701 Insomnia due to medical condition: Secondary | ICD-10-CM

## 2022-10-22 DIAGNOSIS — E7800 Pure hypercholesterolemia, unspecified: Secondary | ICD-10-CM

## 2022-10-22 DIAGNOSIS — K219 Gastro-esophageal reflux disease without esophagitis: Secondary | ICD-10-CM

## 2022-10-22 DIAGNOSIS — E1165 Type 2 diabetes mellitus with hyperglycemia: Secondary | ICD-10-CM

## 2022-11-12 ENCOUNTER — Other Ambulatory Visit: Payer: Self-pay | Admitting: Family Medicine

## 2022-11-12 DIAGNOSIS — J0110 Acute frontal sinusitis, unspecified: Secondary | ICD-10-CM

## 2022-11-17 ENCOUNTER — Encounter: Payer: Self-pay | Admitting: *Deleted

## 2022-11-30 ENCOUNTER — Other Ambulatory Visit: Payer: Self-pay | Admitting: Family Medicine

## 2022-11-30 DIAGNOSIS — G4701 Insomnia due to medical condition: Secondary | ICD-10-CM

## 2022-12-02 ENCOUNTER — Telehealth: Payer: Self-pay

## 2022-12-02 NOTE — Telephone Encounter (Signed)
*  Primary  Pharmacy Patient Advocate Encounter   Received notification from CoverMyMeds that prior authorization for Ozempic (2 MG/DOSE) 8MG /3ML pen-injectors  is required/requested.   Insurance verification completed.   The patient is insured through [facility] .   Per test claim: PA required; PA submitted to [facility] via CoverMyMeds Key/confirmation #/EOC [facility] Status is pending

## 2022-12-05 ENCOUNTER — Other Ambulatory Visit: Payer: Self-pay | Admitting: Family Medicine

## 2022-12-05 DIAGNOSIS — K219 Gastro-esophageal reflux disease without esophagitis: Secondary | ICD-10-CM

## 2022-12-05 DIAGNOSIS — E7800 Pure hypercholesterolemia, unspecified: Secondary | ICD-10-CM

## 2022-12-05 DIAGNOSIS — E1165 Type 2 diabetes mellitus with hyperglycemia: Secondary | ICD-10-CM

## 2022-12-07 ENCOUNTER — Other Ambulatory Visit (HOSPITAL_COMMUNITY): Payer: Self-pay

## 2022-12-07 NOTE — Telephone Encounter (Signed)
Pharmacy Patient Advocate Encounter  Received notification from [facility] that Prior Authorization for Ozempic (2 MG/DOSE) 8MG /3ML pen-injectors has been APPROVED from 12-02-2022 to 12-01-2025   PA #/Case ID/Reference #: Bevely Palmer

## 2022-12-22 ENCOUNTER — Ambulatory Visit: Payer: BC Managed Care – PPO | Admitting: Family Medicine

## 2022-12-27 ENCOUNTER — Ambulatory Visit: Payer: BC Managed Care – PPO | Admitting: Family Medicine

## 2023-01-20 ENCOUNTER — Encounter: Payer: Self-pay | Admitting: Family Medicine

## 2023-01-20 ENCOUNTER — Ambulatory Visit: Payer: BC Managed Care – PPO | Admitting: Family Medicine

## 2023-01-20 ENCOUNTER — Other Ambulatory Visit: Payer: Self-pay | Admitting: Family Medicine

## 2023-01-20 VITALS — BP 95/68 | HR 115 | Temp 97.5°F | Ht 70.0 in | Wt 205.1 lb

## 2023-01-20 DIAGNOSIS — E7800 Pure hypercholesterolemia, unspecified: Secondary | ICD-10-CM | POA: Diagnosis not present

## 2023-01-20 DIAGNOSIS — E1165 Type 2 diabetes mellitus with hyperglycemia: Secondary | ICD-10-CM

## 2023-01-20 DIAGNOSIS — I10 Essential (primary) hypertension: Secondary | ICD-10-CM | POA: Diagnosis not present

## 2023-01-20 DIAGNOSIS — I471 Supraventricular tachycardia, unspecified: Secondary | ICD-10-CM

## 2023-01-20 DIAGNOSIS — K580 Irritable bowel syndrome with diarrhea: Secondary | ICD-10-CM

## 2023-01-20 DIAGNOSIS — Z23 Encounter for immunization: Secondary | ICD-10-CM

## 2023-01-20 DIAGNOSIS — K219 Gastro-esophageal reflux disease without esophagitis: Secondary | ICD-10-CM

## 2023-01-20 LAB — CBC WITH DIFFERENTIAL/PLATELET
Basophils Absolute: 0.1 10*3/uL (ref 0.0–0.2)
Basos: 1 %
EOS (ABSOLUTE): 0.3 10*3/uL (ref 0.0–0.4)
Eos: 3 %
Hematocrit: 48.6 % (ref 37.5–51.0)
Hemoglobin: 15.9 g/dL (ref 13.0–17.7)
Immature Grans (Abs): 0 10*3/uL (ref 0.0–0.1)
Immature Granulocytes: 1 %
Lymphocytes Absolute: 2 10*3/uL (ref 0.7–3.1)
Lymphs: 25 %
MCH: 30.7 pg (ref 26.6–33.0)
MCHC: 32.7 g/dL (ref 31.5–35.7)
MCV: 94 fL (ref 79–97)
Monocytes Absolute: 0.7 10*3/uL (ref 0.1–0.9)
Monocytes: 9 %
Neutrophils Absolute: 5.1 10*3/uL (ref 1.4–7.0)
Neutrophils: 61 %
Platelets: 282 10*3/uL (ref 150–450)
RBC: 5.18 x10E6/uL (ref 4.14–5.80)
RDW: 12.8 % (ref 11.6–15.4)
WBC: 8.2 10*3/uL (ref 3.4–10.8)

## 2023-01-20 LAB — CMP14+EGFR
ALT: 24 [IU]/L (ref 0–44)
AST: 20 [IU]/L (ref 0–40)
Albumin: 4.5 g/dL (ref 3.9–4.9)
Alkaline Phosphatase: 95 [IU]/L (ref 44–121)
BUN/Creatinine Ratio: 12 (ref 10–24)
BUN: 13 mg/dL (ref 8–27)
Bilirubin Total: 0.7 mg/dL (ref 0.0–1.2)
CO2: 23 mmol/L (ref 20–29)
Calcium: 9.6 mg/dL (ref 8.6–10.2)
Chloride: 100 mmol/L (ref 96–106)
Creatinine, Ser: 1.11 mg/dL (ref 0.76–1.27)
Globulin, Total: 2.5 g/dL (ref 1.5–4.5)
Glucose: 99 mg/dL (ref 70–99)
Potassium: 4.6 mmol/L (ref 3.5–5.2)
Sodium: 140 mmol/L (ref 134–144)
Total Protein: 7 g/dL (ref 6.0–8.5)
eGFR: 74 mL/min/{1.73_m2} (ref >59)

## 2023-01-20 LAB — LIPID PANEL
Chol/HDL Ratio: 2.7 ratio (ref 0.0–5.0)
Cholesterol, Total: 112 mg/dL (ref 100–199)
HDL: 42 mg/dL (ref >39)
LDL Chol Calc (NIH): 57 mg/dL (ref 0–99)
Triglycerides: 55 mg/dL (ref 0–149)
VLDL Cholesterol Cal: 13 mg/dL (ref 5–40)

## 2023-01-20 LAB — BAYER DCA HB A1C WAIVED: HB A1C (BAYER DCA - WAIVED): 5.6 % (ref 4.8–5.6)

## 2023-01-20 MED ORDER — METFORMIN HCL ER 750 MG PO TB24: 750.0000 mg | ORAL_TABLET | Freq: Every day | ORAL | 3 refills | Status: DC

## 2023-01-20 MED ORDER — DILTIAZEM HCL ER COATED BEADS 180 MG PO CP24: 180.0000 mg | ORAL_CAPSULE | Freq: Every day | ORAL | 1 refills | Status: DC

## 2023-01-20 MED ORDER — OZEMPIC (2 MG/DOSE) 8 MG/3ML ~~LOC~~ SOPN: 2.0000 mg | PEN_INJECTOR | SUBCUTANEOUS | 3 refills | Status: DC

## 2023-01-20 MED ORDER — ATORVASTATIN CALCIUM 20 MG PO TABS: ORAL_TABLET | ORAL | 3 refills | Status: DC

## 2023-01-20 MED ORDER — RIVAROXABAN 20 MG PO TABS: 20.0000 mg | ORAL_TABLET | Freq: Every day | ORAL | 3 refills | Status: DC

## 2023-01-20 MED ORDER — OMEPRAZOLE 20 MG PO CPDR: DELAYED_RELEASE_CAPSULE | ORAL | 3 refills | Status: DC

## 2023-01-20 MED ORDER — LINACLOTIDE 290 MCG PO CAPS: ORAL_CAPSULE | ORAL | 3 refills | Status: DC

## 2023-01-20 NOTE — Progress Notes (Signed)
Subjective:  Patient ID: Brian Roach,  male    DOB: 15-Nov-1960  Age: 62 y.o.    CC: Medical Management of Chronic Issues   HPI Brian Roach presents for  follow-up of hypertension. Patient has no history of headache chest pain or shortness of breath or recent cough. Patient also denies symptoms of TIA such as numbness weakness lateralizing. Patient denies side effects from medication. States taking it regularly.  Patient also  in for follow-up of elevated cholesterol. Doing well without complaints on current medication. Denies side effects  including myalgia and arthralgia and nausea. Also in today for liver function testing. Currently no chest pain, shortness of breath or other cardiovascular related symptoms noted.  Follow-up of diabetes. Patient does check blood sugar at home. Patient denies symptoms such as excessive hunger or urinary frequency, excessive hunger, nausea No significant hypoglycemic spells noted. Medications reviewed. Pt reports taking them regularly. Pt. denies complication/adverse reaction today.    History Brian Roach has a past medical history of Diabetes mellitus without complication (HCC), Hyperlipidemia, Hypertension, PE (pulmonary embolism), and PONV (postoperative nausea and vomiting).   Brian Roach has a past surgical history that includes Back surgery; Knee surgery; Lung surgery; Anterior cruciate ligament repair (Right); Wisdom tooth extraction; Quadriceps tendon repair (Left, 09/23/2016); Colonoscopy with propofol (N/A, 04/20/2021); and Cataract extraction (Right).   His family history includes Heart disease in his brother and father.Brian Roach reports that Brian Roach has never smoked. Brian Roach has never used smokeless tobacco. Brian Roach reports that Brian Roach does not drink alcohol and does not use drugs.  Current Outpatient Medications on File Prior to Visit  Medication Sig Dispense Refill   benazepril (LOTENSIN) 20 MG tablet Take 1 tablet (20 mg total) by mouth daily. 90 tablet 3    cetirizine (ZYRTEC) 10 MG tablet TAKE 1 TABLET (10 MG TOTAL) BY MOUTH DAILY. FOR ALLERGY SYMPTOMS 90 tablet 3   cyclobenzaprine (FLEXERIL) 10 MG tablet TAKE 1 TABLET BY MOUTH THREE TIMES A DAY AS NEEDED FOR MUSCLE SPASM 90 tablet 5   fluticasone (FLONASE) 50 MCG/ACT nasal spray SPRAY 2 SPRAYS INTO EACH NOSTRIL EVERY DAY 48 mL 1   Multiple Vitamins-Minerals (ONE-A-DAY MENS HEALTH FORMULA PO) Take 1 tablet by mouth daily.     Omega-3 Fatty Acids (FISH OIL) 1000 MG CAPS Take 1,000 mg by mouth in the morning and at bedtime.     trazodone (DESYREL) 300 MG tablet TAKE 1 TABLET BY MOUTH EVERYDAY AT BEDTIME 90 tablet 0   No current facility-administered medications on file prior to visit.    ROS Review of Systems  Constitutional:  Negative for fever.  Respiratory:  Negative for shortness of breath.   Cardiovascular:  Negative for chest pain.  Musculoskeletal:  Negative for arthralgias.  Skin:  Negative for rash.    Objective:  BP 95/68   Pulse (!) 115   Temp (!) 97.5 F (36.4 C)   Ht 5\' 10"  (1.778 m)   Wt 205 lb 1.6 oz (93 kg)   SpO2 96%   BMI 29.43 kg/m   BP Readings from Last 3 Encounters:  01/20/23 95/68  09/22/22 112/70  06/21/22 112/66    Wt Readings from Last 3 Encounters:  01/20/23 205 lb 1.6 oz (93 kg)  09/22/22 232 lb 9.6 oz (105.5 kg)  06/21/22 234 lb 9.6 oz (106.4 kg)     Physical Exam Vitals reviewed.  Constitutional:      Appearance: Brian Roach is well-developed.  HENT:     Head: Normocephalic and atraumatic.  Right Ear: External ear normal.     Left Ear: External ear normal.     Mouth/Throat:     Pharynx: No oropharyngeal exudate or posterior oropharyngeal erythema.  Eyes:     Pupils: Pupils are equal, round, and reactive to light.  Cardiovascular:     Rate and Rhythm: Normal rate and regular rhythm.     Heart sounds: No murmur heard. Pulmonary:     Effort: No respiratory distress.     Breath sounds: Normal breath sounds.  Musculoskeletal:     Cervical  back: Normal range of motion and neck supple.  Neurological:     Mental Status: Brian Roach is alert and oriented to person, place, and time.     Diabetic Foot Exam - Simple   No data filed     Lab Results  Component Value Date   HGBA1C 6.2 (H) 09/22/2022   HGBA1C 6.4 (H) 06/21/2022   HGBA1C 6.9 (H) 03/15/2022    Assessment & Plan:   Brian Roach" was seen today for medical management of chronic issues.  Diagnoses and all orders for this visit:  Uncontrolled type 2 diabetes mellitus with hyperglycemia (HCC) -     Microalbumin / creatinine urine ratio -     Bayer DCA Hb A1c Waived -     atorvastatin (LIPITOR) 20 MG tablet; TAKE 1 TABLET BY MOUTH EVERY DAY IN THE EVENING -     metFORMIN (GLUCOPHAGE-XR) 750 MG 24 hr tablet; Take 1 tablet (750 mg total) by mouth daily with breakfast.  HTN (hypertension), benign -     CBC with Differential/Platelet -     CMP14+EGFR -     diltiazem (CARDIZEM CD) 180 MG 24 hr capsule; Take 1 capsule (180 mg total) by mouth daily. TAKE 1 CAPSULE BY MOUTH EVERY DAY  Hypercholesteremia -     Lipid panel -     atorvastatin (LIPITOR) 20 MG tablet; TAKE 1 TABLET BY MOUTH EVERY DAY IN THE EVENING  Paroxysmal SVT (supraventricular tachycardia) (HCC) -     diltiazem (CARDIZEM CD) 180 MG 24 hr capsule; Take 1 capsule (180 mg total) by mouth daily. TAKE 1 CAPSULE BY MOUTH EVERY DAY  Irritable bowel syndrome with diarrhea -     linaclotide (LINZESS) 290 MCG CAPS capsule; TAKE 1 CAPSULE (290 MCG TOTAL) BY MOUTH DAILY. TO REGULATE BOWEL MOVEMENTS  Gastroesophageal reflux disease without esophagitis -     omeprazole (PRILOSEC) 20 MG capsule; TAKE 1 CAPSULE BY MOUTH EVERY DAY  Need for influenza vaccination -     Flu vaccine trivalent PF, 6mos and older(Flulaval,Afluria,Fluarix,Fluzone)  Other orders -     rivaroxaban (XARELTO) 20 MG TABS tablet; Take 1 tablet (20 mg total) by mouth daily with supper. -     Semaglutide, 2 MG/DOSE, (OZEMPIC, 2 MG/DOSE,) 8  MG/3ML SOPN; Inject 2 mg into the skin once a week.   I have changed Brian Buff "Brian Roach"'s diltiazem. I am also having him maintain his Multiple Vitamins-Minerals (ONE-A-DAY MENS HEALTH FORMULA PO), Fish Oil, cetirizine, benazepril, cyclobenzaprine, fluticasone, trazodone, atorvastatin, linaclotide, metFORMIN, omeprazole, rivaroxaban, and Ozempic (2 MG/DOSE).  Meds ordered this encounter  Medications   atorvastatin (LIPITOR) 20 MG tablet    Sig: TAKE 1 TABLET BY MOUTH EVERY DAY IN THE EVENING    Dispense:  90 tablet    Refill:  3   diltiazem (CARDIZEM CD) 180 MG 24 hr capsule    Sig: Take 1 capsule (180 mg total) by mouth daily. TAKE 1 CAPSULE BY  MOUTH EVERY DAY    Dispense:  90 capsule    Refill:  1   linaclotide (LINZESS) 290 MCG CAPS capsule    Sig: TAKE 1 CAPSULE (290 MCG TOTAL) BY MOUTH DAILY. TO REGULATE BOWEL MOVEMENTS    Dispense:  90 capsule    Refill:  3   metFORMIN (GLUCOPHAGE-XR) 750 MG 24 hr tablet    Sig: Take 1 tablet (750 mg total) by mouth daily with breakfast.    Dispense:  90 tablet    Refill:  3   omeprazole (PRILOSEC) 20 MG capsule    Sig: TAKE 1 CAPSULE BY MOUTH EVERY DAY    Dispense:  90 capsule    Refill:  3   rivaroxaban (XARELTO) 20 MG TABS tablet    Sig: Take 1 tablet (20 mg total) by mouth daily with supper.    Dispense:  90 tablet    Refill:  3   Semaglutide, 2 MG/DOSE, (OZEMPIC, 2 MG/DOSE,) 8 MG/3ML SOPN    Sig: Inject 2 mg into the skin once a week.    Dispense:  9 mL    Refill:  3     Follow-up: Return in about 3 months (around 04/22/2023).  [name], M.D.

## 2023-01-21 LAB — MICROALBUMIN / CREATININE URINE RATIO
Creatinine, Urine: 190.7 mg/dL
Microalb/Creat Ratio: 10 mg/g{creat} (ref 0–29)
Microalbumin, Urine: 18.9 ug/mL

## 2023-01-21 NOTE — Progress Notes (Signed)
Hello Laney,  Your lab result is normal and/or stable.Some minor variations that are not significant are commonly marked abnormal, but do not represent any medical problem for you.  Best regards, [name], M.D.

## 2023-02-25 ENCOUNTER — Other Ambulatory Visit: Payer: Self-pay | Admitting: Family Medicine

## 2023-02-25 DIAGNOSIS — G4701 Insomnia due to medical condition: Secondary | ICD-10-CM

## 2023-03-24 ENCOUNTER — Other Ambulatory Visit: Payer: Self-pay | Admitting: Family Medicine

## 2023-03-31 ENCOUNTER — Encounter: Payer: Self-pay | Admitting: Family Medicine

## 2023-03-31 ENCOUNTER — Ambulatory Visit: Payer: 59 | Admitting: Family Medicine

## 2023-03-31 VITALS — BP 117/60 | HR 67 | Temp 98.7°F | Ht 70.0 in | Wt 187.6 lb

## 2023-03-31 DIAGNOSIS — J0140 Acute pansinusitis, unspecified: Secondary | ICD-10-CM | POA: Diagnosis not present

## 2023-03-31 DIAGNOSIS — M940 Chondrocostal junction syndrome [Tietze]: Secondary | ICD-10-CM

## 2023-03-31 MED ORDER — AMOXICILLIN-POT CLAVULANATE 875-125 MG PO TABS: 1.0000 | ORAL_TABLET | Freq: Two times a day (BID) | ORAL | 0 refills | Status: AC

## 2023-03-31 NOTE — Progress Notes (Signed)
Acute Office Visit  Subjective:     Patient ID: Brian Roach, male    DOB: Mar 01, 1961, 63 y.o.   MRN: [id number]  Chief Complaint  Patient presents with   Nasal Congestion    HPI Patient is in today for nasal congestion and sinus pressure x 1 week. Unchanged. His ears feel congest. Report mild dry cough. Denies fever, chills, sore throat. He has had a sharp pain in the front of his ribs. This pain only occurs when her twists or takes a deep breath. No injury. He does a lot of lifting. No chronic lung history.   ROS As per HPI.      Objective:    BP 117/60   Pulse 67   Temp 98.7 F (37.1 C) (Temporal)   Ht 5\' 10"  (1.778 m)   Wt 187 lb 9.6 oz (85.1 kg)   SpO2 97%   BMI 26.92 kg/m    Physical Exam Vitals and nursing note reviewed.  Constitutional:      General: He is not in acute distress.    Appearance: He is not ill-appearing, toxic-appearing or diaphoretic.  HENT:     Head: Normocephalic and atraumatic.     Right Ear: Tympanic membrane, ear canal and external ear normal.     Left Ear: Tympanic membrane, ear canal and external ear normal.     Nose: Congestion present.     Right Sinus: Maxillary sinus tenderness and frontal sinus tenderness present.     Left Sinus: Maxillary sinus tenderness and frontal sinus tenderness present.     Mouth/Throat:     Mouth: Mucous membranes are moist.     Pharynx: Oropharynx is clear. No oropharyngeal exudate or posterior oropharyngeal erythema.  Eyes:     General:        Right eye: No discharge.        Left eye: No discharge.     Conjunctiva/sclera: Conjunctivae normal.  Cardiovascular:     Rate and Rhythm: Normal rate and regular rhythm.     Heart sounds: Normal heart sounds. No murmur heard. Pulmonary:     Effort: Pulmonary effort is normal. No respiratory distress.     Breath sounds: Normal breath sounds. No wheezing, rhonchi or rales.  Chest:     Chest wall: No tenderness.  Abdominal:     General: Bowel sounds are  normal.     Palpations: Abdomen is soft.  Musculoskeletal:     Cervical back: Neck supple. No rigidity.     Right lower leg: No edema.     Left lower leg: No edema.  Skin:    General: Skin is warm and dry.  Neurological:     General: No focal deficit present.     Mental Status: He is alert and oriented to person, place, and time.  Psychiatric:        Mood and Affect: Mood normal.        Behavior: Behavior normal.     No results found for any visits on 03/31/23.      Assessment & Plan:   Norfleet Capers" was seen today for nasal congestion.  Diagnoses and all orders for this visit:  Acute non-recurrent pansinusitis Augmentin as below. Discussed symptomatic care and return precautions.  -     amoxicillin-clavulanate (AUGMENTIN) 875-125 MG tablet; Take 1 tablet by mouth 2 (two) times daily for 7 days.  Costochondritis Lungs clear on exam today. Discussed symptomatic care and return precautions.    The patient indicates  understanding of these issues and agrees with the plan.  Gabriel Earing, FNP

## 2023-04-25 ENCOUNTER — Encounter: Payer: Self-pay | Admitting: Family Medicine

## 2023-04-25 ENCOUNTER — Ambulatory Visit: Payer: 59 | Admitting: Family Medicine

## 2023-04-25 VITALS — BP 102/66 | HR 89 | Temp 97.6°F | Ht 70.0 in | Wt 183.0 lb

## 2023-04-25 DIAGNOSIS — I471 Supraventricular tachycardia, unspecified: Secondary | ICD-10-CM | POA: Diagnosis not present

## 2023-04-25 DIAGNOSIS — Z23 Encounter for immunization: Secondary | ICD-10-CM | POA: Diagnosis not present

## 2023-04-25 DIAGNOSIS — E7800 Pure hypercholesterolemia, unspecified: Secondary | ICD-10-CM

## 2023-04-25 DIAGNOSIS — E1165 Type 2 diabetes mellitus with hyperglycemia: Secondary | ICD-10-CM

## 2023-04-25 DIAGNOSIS — I10 Essential (primary) hypertension: Secondary | ICD-10-CM | POA: Diagnosis not present

## 2023-04-25 DIAGNOSIS — Z7985 Long-term (current) use of injectable non-insulin antidiabetic drugs: Secondary | ICD-10-CM

## 2023-04-25 LAB — BAYER DCA HB A1C WAIVED: HB A1C (BAYER DCA - WAIVED): 5.6 % (ref 4.8–5.6)

## 2023-04-25 NOTE — Progress Notes (Signed)
 Subjective:  Patient ID: Brian Roach,  male    DOB: [birth year]  Age: 63 y.o.    CC: Medical Management of Chronic Issues   HPI Brian Roach presents for  follow-up of hypertension. Patient has no history of headache chest pain or shortness of breath or recent cough. Patient also denies symptoms of TIA such as numbness weakness lateralizing. Patient denies side effects from medication. States taking it regularly.  Patient also  in for follow-up of elevated cholesterol. Doing well without complaints on current medication. Denies side effects  including myalgia and arthralgia and nausea. Also in today for liver function testing. Currently no chest pain, shortness of breath or other cardiovascular related symptoms noted.  Follow-up of diabetes. Patient does not check blood sugar at home. Patient denies symptoms such as excessive hunger or urinary frequency, excessive hunger, nausea No significant hypoglycemic spells noted. Medications reviewed. Pt reports taking them regularly. Pt. denies complication/adverse reaction today.    History Brian Roach has a past medical history of Diabetes mellitus without complication (HCC), Hyperlipidemia, Hypertension, PE (pulmonary embolism), and PONV (postoperative nausea and vomiting).   Brian Roach has a past surgical history that includes Back surgery; Knee surgery; Lung surgery; Anterior cruciate ligament repair (Right); Wisdom tooth extraction; Quadriceps tendon repair (Left, 09/23/2016); Colonoscopy with propofol (N/A, 04/20/2021); and Cataract extraction (Right).   Brian Roach family history includes Heart disease in Brian Roach brother and father.Brian Roach reports that Brian Roach has never smoked. Brian Roach has never used smokeless tobacco. Brian Roach reports that Brian Roach does not drink alcohol and does not use drugs.  Current Outpatient Medications on File Prior to Visit  Medication Sig Dispense Refill   atorvastatin (LIPITOR) 20 MG tablet TAKE 1 TABLET BY MOUTH EVERY DAY IN THE EVENING 90 tablet 3    benazepril (LOTENSIN) 20 MG tablet Take 1 tablet (20 mg total) by mouth daily. 90 tablet 3   cetirizine (ZYRTEC) 10 MG tablet TAKE 1 TABLET (10 MG TOTAL) BY MOUTH DAILY. FOR ALLERGY SYMPTOMS 90 tablet 1   cyclobenzaprine (FLEXERIL) 10 MG tablet TAKE 1 TABLET BY MOUTH THREE TIMES A DAY AS NEEDED FOR MUSCLE SPASM 90 tablet 5   fluticasone (FLONASE) 50 MCG/ACT nasal spray SPRAY 2 SPRAYS INTO EACH NOSTRIL EVERY DAY 48 mL 1   linaclotide (LINZESS) 290 MCG CAPS capsule TAKE 1 CAPSULE (290 MCG TOTAL) BY MOUTH DAILY. TO REGULATE BOWEL MOVEMENTS 90 capsule 3   Multiple Vitamins-Minerals (ONE-A-DAY MENS HEALTH FORMULA PO) Take 1 tablet by mouth daily.     Omega-3 Fatty Acids (FISH OIL) 1000 MG CAPS Take 1,000 mg by mouth in the morning and at bedtime.     rivaroxaban (XARELTO) 20 MG TABS tablet Take 1 tablet (20 mg total) by mouth daily with supper. 90 tablet 3   Semaglutide, 2 MG/DOSE, (OZEMPIC, 2 MG/DOSE,) 8 MG/3ML SOPN Inject 2 mg into the skin once a week. 9 mL 3   trazodone (DESYREL) 300 MG tablet TAKE 1 TABLET BY MOUTH EVERYDAY AT BEDTIME 90 tablet 0   No current facility-administered medications on file prior to visit.    ROS Review of Systems  Constitutional:  Negative for fever.  Respiratory:  Negative for shortness of breath.   Cardiovascular:  Negative for chest pain.  Musculoskeletal:  Negative for arthralgias.  Skin:  Negative for rash.    Objective:  BP 102/66   Pulse 89   Temp 97.6 F (36.4 C)   Ht 5\' 10"  (1.778 m)   Wt 183 lb (83 kg)  SpO2 95%   BMI 26.26 kg/m   BP Readings from Last 3 Encounters:  04/25/23 102/66  03/31/23 117/60  01/20/23 95/68    Wt Readings from Last 3 Encounters:  04/25/23 183 lb (83 kg)  03/31/23 187 lb 9.6 oz (85.1 kg)  01/20/23 205 lb 1.6 oz (93 kg)     Physical Exam Vitals reviewed.  Constitutional:      General: Brian Roach is not in acute distress.    Appearance: Normal appearance. Brian Roach is well-developed and normal weight. Brian Roach is not  ill-appearing.  HENT:     Head: Normocephalic and atraumatic.     Right Ear: External ear normal.     Left Ear: External ear normal.     Mouth/Throat:     Pharynx: No oropharyngeal exudate or posterior oropharyngeal erythema.  Eyes:     Pupils: Pupils are equal, round, and reactive to light.  Cardiovascular:     Rate and Rhythm: Normal rate and regular rhythm.     Heart sounds: No murmur heard. Pulmonary:     Effort: No respiratory distress.     Breath sounds: Normal breath sounds.  Musculoskeletal:     Cervical back: Normal range of motion and neck supple.  Neurological:     Mental Status: Brian Roach is alert and oriented to person, place, and time.    Diabetic Foot Exam - Simple   No data filed     Lab Results  Component Value Date   HGBA1C 5.6 01/20/2023   HGBA1C 6.2 (H) 09/22/2022   HGBA1C 6.4 (H) 06/21/2022    Assessment & Plan:   Brian Roach" was seen today for medical management of chronic issues.  Diagnoses and all orders for this visit:  Uncontrolled type 2 diabetes mellitus with hyperglycemia (HCC) -     Bayer DCA Hb A1c Waived  HTN (hypertension), benign -     CBC with Differential/Platelet -     CMP14+EGFR  Hypercholesteremia -     Lipid panel  Paroxysmal SVT (supraventricular tachycardia) (HCC) -     CBC with Differential/Platelet -     CMP14+EGFR  Immunization due -     Pneumococcal conjugate vaccine 20-valent (Prevnar 20)   I have discontinued Brian Buff "[name]"'s diltiazem, metFORMIN, and omeprazole. I am also having Brian Roach maintain Brian Roach Multiple Vitamins-Minerals (ONE-A-DAY MENS HEALTH FORMULA PO), Fish Oil, benazepril, cyclobenzaprine, fluticasone, atorvastatin, linaclotide, rivaroxaban, Ozempic (2 MG/DOSE), trazodone, and cetirizine.  No orders of the defined types were placed in this encounter.  Due to weight loss, now at goal, will taper off some of Brian Roach medications  Follow-up: Return in about 3 months (around 07/23/2023).  [name], M.D.

## 2023-04-26 LAB — CMP14+EGFR
ALT: 15 IU/L (ref 0–44)
AST: 19 IU/L (ref 0–40)
Albumin: 4.4 g/dL (ref 3.9–4.9)
Alkaline Phosphatase: 80 IU/L (ref 44–121)
BUN/Creatinine Ratio: 10 (ref 10–24)
BUN: 9 mg/dL (ref 8–27)
Bilirubin Total: 0.5 mg/dL (ref 0.0–1.2)
CO2: 24 mmol/L (ref 20–29)
Calcium: 9.4 mg/dL (ref 8.6–10.2)
Chloride: 103 mmol/L (ref 96–106)
Creatinine, Ser: 0.88 mg/dL (ref 0.76–1.27)
Globulin, Total: 2.2 g/dL (ref 1.5–4.5)
Glucose: 106 mg/dL — ABNORMAL HIGH (ref 70–99)
Potassium: 5 mmol/L (ref 3.5–5.2)
Sodium: 142 mmol/L (ref 134–144)
Total Protein: 6.6 g/dL (ref 6.0–8.5)
eGFR: 96 mL/min/{1.73_m2} (ref >59)

## 2023-04-26 LAB — CBC WITH DIFFERENTIAL/PLATELET
Basophils Absolute: 0.1 10*3/uL (ref 0.0–0.2)
Basos: 1 %
EOS (ABSOLUTE): 0.4 10*3/uL (ref 0.0–0.4)
Eos: 6 %
Hematocrit: 47.2 % (ref 37.5–51.0)
Hemoglobin: 15.5 g/dL (ref 13.0–17.7)
Immature Grans (Abs): 0 10*3/uL (ref 0.0–0.1)
Immature Granulocytes: 0 %
Lymphocytes Absolute: 1.9 10*3/uL (ref 0.7–3.1)
Lymphs: 29 %
MCH: 31.5 pg (ref 26.6–33.0)
MCHC: 32.8 g/dL (ref 31.5–35.7)
MCV: 96 fL (ref 79–97)
Monocytes Absolute: 0.6 10*3/uL (ref 0.1–0.9)
Monocytes: 8 %
Neutrophils Absolute: 3.7 10*3/uL (ref 1.4–7.0)
Neutrophils: 56 %
Platelets: 218 10*3/uL (ref 150–450)
RBC: 4.92 x10E6/uL (ref 4.14–5.80)
RDW: 12.8 % (ref 11.6–15.4)
WBC: 6.7 10*3/uL (ref 3.4–10.8)

## 2023-04-26 LAB — LIPID PANEL
Chol/HDL Ratio: 2.2 ratio (ref 0.0–5.0)
Cholesterol, Total: 131 mg/dL (ref 100–199)
HDL: 60 mg/dL (ref >39)
LDL Chol Calc (NIH): 60 mg/dL (ref 0–99)
Triglycerides: 50 mg/dL (ref 0–149)
VLDL Cholesterol Cal: 11 mg/dL (ref 5–40)

## 2023-04-28 ENCOUNTER — Encounter: Payer: Self-pay | Admitting: Family Medicine

## 2023-04-28 NOTE — Progress Notes (Signed)
 Hello [place],  Your lab result is normal and/or stable.Some minor variations that are not significant are commonly marked abnormal, but do not represent any medical problem for you.  Best regards, [name], M.D.

## 2023-05-21 ENCOUNTER — Other Ambulatory Visit: Payer: Self-pay | Admitting: Family Medicine

## 2023-05-30 ENCOUNTER — Other Ambulatory Visit: Payer: Self-pay | Admitting: Family Medicine

## 2023-05-30 DIAGNOSIS — I471 Supraventricular tachycardia, unspecified: Secondary | ICD-10-CM

## 2023-05-30 DIAGNOSIS — I10 Essential (primary) hypertension: Secondary | ICD-10-CM

## 2023-06-03 ENCOUNTER — Other Ambulatory Visit: Payer: Self-pay | Admitting: Family Medicine

## 2023-06-03 DIAGNOSIS — G4701 Insomnia due to medical condition: Secondary | ICD-10-CM

## 2023-07-27 ENCOUNTER — Ambulatory Visit: Payer: 59 | Admitting: Family Medicine

## 2023-07-27 ENCOUNTER — Encounter: Payer: Self-pay | Admitting: Family Medicine

## 2023-07-27 ENCOUNTER — Ambulatory Visit: Payer: Self-pay | Admitting: Family Medicine

## 2023-07-27 VITALS — BP 102/63 | HR 74 | Temp 97.8°F | Ht 70.0 in | Wt 179.0 lb

## 2023-07-27 DIAGNOSIS — E1169 Type 2 diabetes mellitus with other specified complication: Secondary | ICD-10-CM

## 2023-07-27 DIAGNOSIS — G4701 Insomnia due to medical condition: Secondary | ICD-10-CM | POA: Diagnosis not present

## 2023-07-27 DIAGNOSIS — I10 Essential (primary) hypertension: Secondary | ICD-10-CM

## 2023-07-27 DIAGNOSIS — E7800 Pure hypercholesterolemia, unspecified: Secondary | ICD-10-CM | POA: Diagnosis not present

## 2023-07-27 DIAGNOSIS — J3089 Other allergic rhinitis: Secondary | ICD-10-CM

## 2023-07-27 DIAGNOSIS — E756 Lipid storage disorder, unspecified: Secondary | ICD-10-CM

## 2023-07-27 LAB — LIPID PANEL

## 2023-07-27 LAB — BAYER DCA HB A1C WAIVED: HB A1C (BAYER DCA - WAIVED): 5.2 % (ref 4.8–5.6)

## 2023-07-27 MED ORDER — BENAZEPRIL HCL 20 MG PO TABS: 10.0000 mg | ORAL_TABLET | Freq: Every day | ORAL | 3 refills | Status: DC

## 2023-07-27 MED ORDER — BENAZEPRIL HCL 20 MG PO TABS
20.0000 mg | ORAL_TABLET | Freq: Every day | ORAL | 3 refills | Status: DC
Start: 2023-07-27 — End: 2023-07-27

## 2023-07-27 MED ORDER — OZEMPIC (2 MG/DOSE) 8 MG/3ML ~~LOC~~ SOPN: 2.0000 mg | PEN_INJECTOR | SUBCUTANEOUS | 3 refills | Status: DC

## 2023-07-27 MED ORDER — TRAZODONE HCL 300 MG PO TABS: ORAL_TABLET | ORAL | 0 refills | Status: DC

## 2023-07-27 MED ORDER — FLUTICASONE PROPIONATE 50 MCG/ACT NA SUSP: 2.0000 | Freq: Every day | NASAL | 3 refills | Status: AC

## 2023-07-27 NOTE — Progress Notes (Signed)
 Subjective:  Patient ID: Brian Roach,  male    DOB: 1960-05-08  Age: 63 y.o.    CC: Medical Management of Chronic Issues (No concerns today )   HPI Brian Roach presents for  follow-up of hypertension. Patient has no history of headache chest pain or shortness of breath or recent cough. Patient also denies symptoms of TIA such as numbness weakness lateralizing. Patient denies side effects from medication. States taking it regularly.  Patient also  in for follow-up of elevated cholesterol. Doing well without complaints on current medication. Denies side effects  including myalgia and arthralgia and nausea. Also in today for liver function testing. Currently no chest pain, shortness of breath or other cardiovascular related symptoms noted.  Follow-up of diabetes. Patient does check blood sugar at home. Readings run between 80 and 90 Patient denies symptoms such as excessive hunger or urinary frequency, excessive hunger, nausea No significant hypoglycemic spells noted. Medications reviewed. Pt reports taking them regularly. Pt. denies complication/adverse reaction today.    History Brian Roach has a past medical history of Diabetes mellitus without complication (HCC), Hyperlipidemia, Hypertension, PE (pulmonary embolism), and PONV (postoperative nausea and vomiting).   He has a past surgical history that includes Back surgery; Knee surgery; Lung surgery; Anterior cruciate ligament repair (Right); Wisdom tooth extraction; Quadriceps tendon repair (Left, 09/23/2016); Colonoscopy with propofol  (N/A, 04/20/2021); and Cataract extraction (Right).   His family history includes Heart disease in his brother and father.He reports that he has never smoked. He has never used smokeless tobacco. He reports that he does not drink alcohol and does not use drugs.  Current Outpatient Medications on File Prior to Visit  Medication Sig Dispense Refill   atorvastatin  (LIPITOR) 20 MG tablet TAKE 1 TABLET  BY MOUTH EVERY DAY IN THE EVENING 90 tablet 3   cetirizine  (ZYRTEC ) 10 MG tablet TAKE 1 TABLET (10 MG TOTAL) BY MOUTH DAILY. FOR ALLERGY SYMPTOMS 90 tablet 1   cyclobenzaprine  (FLEXERIL ) 10 MG tablet TAKE 1 TABLET BY MOUTH THREE TIMES A DAY AS NEEDED FOR MUSCLE SPASM 90 tablet 5   linaclotide  (LINZESS ) 290 MCG CAPS capsule TAKE 1 CAPSULE (290 MCG TOTAL) BY MOUTH DAILY. TO REGULATE BOWEL MOVEMENTS 90 capsule 3   Multiple Vitamins-Minerals (ONE-A-DAY MENS HEALTH FORMULA PO) Take 1 tablet by mouth daily.     Omega-3 Fatty Acids (FISH OIL) 1000 MG CAPS Take 1,000 mg by mouth in the morning and at bedtime.     rivaroxaban  (XARELTO ) 20 MG TABS tablet Take 1 tablet (20 mg total) by mouth daily with supper. 90 tablet 3   No current facility-administered medications on file prior to visit.    ROS Review of Systems  Constitutional:  Negative for fever.  Respiratory:  Negative for shortness of breath.   Cardiovascular:  Negative for chest pain.  Musculoskeletal:  Negative for arthralgias.  Skin:  Negative for rash.    Objective:  BP 102/63   Pulse 74   Temp 97.8 F (36.6 C) (Temporal)   Ht 5\' 10"  (1.778 m)   Wt 179 lb (81.2 kg)   BMI 25.68 kg/m   BP Readings from Last 3 Encounters:  07/27/23 102/63  04/25/23 102/66  03/31/23 117/60    Wt Readings from Last 3 Encounters:  07/27/23 179 lb (81.2 kg)  04/25/23 183 lb (83 kg)  03/31/23 187 lb 9.6 oz (85.1 kg)    Lab Results  Component Value Date   HGBA1C 5.6 04/25/2023   HGBA1C 5.6 01/20/2023  HGBA1C 6.2 (H) 09/22/2022    Physical Exam Vitals reviewed.  Constitutional:      Appearance: He is well-developed.  HENT:     Head: Normocephalic and atraumatic.     Right Ear: External ear normal.     Left Ear: External ear normal.     Mouth/Throat:     Pharynx: No oropharyngeal exudate or posterior oropharyngeal erythema.  Eyes:     Pupils: Pupils are equal, round, and reactive to light.  Cardiovascular:     Rate and Rhythm:  Normal rate and regular rhythm.     Heart sounds: No murmur heard. Pulmonary:     Effort: No respiratory distress.     Breath sounds: Normal breath sounds.  Musculoskeletal:     Cervical back: Normal range of motion and neck supple.  Neurological:     Mental Status: He is alert and oriented to person, place, and time.         Assessment & Plan:  Diabetic lipidosis (HCC) -     Bayer DCA Hb A1c Waived -     Benazepril  HCl; Take 0.5 tablets (10 mg total) by mouth daily.  Dispense: 90 tablet; Refill: 3  HTN (hypertension), benign -     CBC with Differential/Platelet -     CMP14+EGFR -     Benazepril  HCl; Take 0.5 tablets (10 mg total) by mouth daily.  Dispense: 90 tablet; Refill: 3  Hypercholesteremia -     Lipid panel  Insomnia due to medical condition -     traZODone  HCl; TAKE 1 TABLET BY MOUTH EVERYDAY AT BEDTIME  Dispense: 90 tablet; Refill: 0  Allergic rhinitis due to other allergic trigger, unspecified seasonality -     Fluticasone  Propionate; Place 2 sprays into both nostrils daily.  Dispense: 48 g; Refill: 3  Other orders -     Ozempic  (2 MG/DOSE); Inject 2 mg into the skin once a week.  Dispense: 9 mL; Refill: 3    Follow-up: Return in about 6 months (around 01/27/2024).  Roise Cleaver, M.D.

## 2023-07-28 LAB — CBC WITH DIFFERENTIAL/PLATELET
Basophils Absolute: 0.1 10*3/uL (ref 0.0–0.2)
Basos: 1 %
EOS (ABSOLUTE): 0.2 10*3/uL (ref 0.0–0.4)
Eos: 3 %
Hematocrit: 43.3 % (ref 37.5–51.0)
Hemoglobin: 14.4 g/dL (ref 13.0–17.7)
Immature Grans (Abs): 0 10*3/uL (ref 0.0–0.1)
Immature Granulocytes: 0 %
Lymphocytes Absolute: 1.8 10*3/uL (ref 0.7–3.1)
Lymphs: 29 %
MCH: 32 pg (ref 26.6–33.0)
MCHC: 33.3 g/dL (ref 31.5–35.7)
MCV: 96 fL (ref 79–97)
Monocytes Absolute: 0.6 10*3/uL (ref 0.1–0.9)
Monocytes: 10 %
Neutrophils Absolute: 3.7 10*3/uL (ref 1.4–7.0)
Neutrophils: 57 %
Platelets: 225 10*3/uL (ref 150–450)
RBC: 4.5 x10E6/uL (ref 4.14–5.80)
RDW: 12.5 % (ref 11.6–15.4)
WBC: 6.3 10*3/uL (ref 3.4–10.8)

## 2023-07-28 LAB — CMP14+EGFR
ALT: 17 IU/L (ref 0–44)
AST: 20 IU/L (ref 0–40)
Albumin: 4.3 g/dL (ref 3.9–4.9)
Alkaline Phosphatase: 76 IU/L (ref 44–121)
BUN/Creatinine Ratio: 8 — ABNORMAL LOW (ref 10–24)
BUN: 9 mg/dL (ref 8–27)
Bilirubin Total: 0.8 mg/dL (ref 0.0–1.2)
CO2: 24 mmol/L (ref 20–29)
Calcium: 9.5 mg/dL (ref 8.6–10.2)
Chloride: 101 mmol/L (ref 96–106)
Creatinine, Ser: 1.08 mg/dL (ref 0.76–1.27)
Globulin, Total: 2.1 g/dL (ref 1.5–4.5)
Glucose: 94 mg/dL (ref 70–99)
Potassium: 4.7 mmol/L (ref 3.5–5.2)
Sodium: 140 mmol/L (ref 134–144)
Total Protein: 6.4 g/dL (ref 6.0–8.5)
eGFR: 77 mL/min/{1.73_m2} (ref >59)

## 2023-07-28 LAB — LIPID PANEL
Cholesterol, Total: 113 mg/dL (ref 100–199)
HDL: 60 mg/dL (ref >39)
LDL CALC COMMENT:: 1.9 ratio (ref 0.0–5.0)
LDL Chol Calc (NIH): 40 mg/dL (ref 0–99)
Triglycerides: 55 mg/dL (ref 0–149)
VLDL Cholesterol Cal: 13 mg/dL (ref 5–40)

## 2023-07-29 NOTE — Progress Notes (Signed)
 Hello [place],  Your lab result is normal and/or stable.Some minor variations that are not significant are commonly marked abnormal, but do not represent any medical problem for you.  Best regards, [name], M.D.

## 2023-08-10 ENCOUNTER — Telehealth: Payer: Self-pay | Admitting: Family Medicine

## 2023-08-10 ENCOUNTER — Other Ambulatory Visit: Payer: Self-pay | Admitting: Family Medicine

## 2023-08-10 NOTE — Telephone Encounter (Signed)
 Name from pharmacy: OZEMPIC  8 MG/3 ML (2 MG/DOSE)        Will file in chart as: OZEMPIC , 2 MG/DOSE, 8 MG/3ML SOPN   Sig: Inject 2 mg into the skin once a week.   Pharmacy comment: Alternative Requested:REQUIRES PA.

## 2023-08-10 NOTE — Telephone Encounter (Signed)
 Name from pharmacy: OZEMPIC  8 MG/3 ML (2 MG/DOSE)  Pharmacy comment: Alternative Requested:NOW REQUIRING A PRIOR AUTH

## 2023-08-11 ENCOUNTER — Telehealth: Payer: Self-pay

## 2023-08-11 ENCOUNTER — Other Ambulatory Visit (HOSPITAL_COMMUNITY): Payer: Self-pay

## 2023-08-11 NOTE — Telephone Encounter (Signed)
 ERROR

## 2023-08-11 NOTE — Telephone Encounter (Signed)
 Name from pharmacy: OZEMPIC  8 MG/3 ML (2 MG/DOSE)  Pharmacy comment: Alternative Requested:RX STATING NOW NEEDS A PRIOR AUTH; MAY JUST NEED A DIAGNOSIS CODE ADDED BUT SYSTEM WONT ALLOW US  TO ADD

## 2023-08-12 ENCOUNTER — Telehealth: Payer: Self-pay

## 2023-08-12 ENCOUNTER — Other Ambulatory Visit (HOSPITAL_COMMUNITY): Payer: Self-pay

## 2023-08-12 NOTE — Telephone Encounter (Signed)
 Pharmacy Patient Advocate Encounter   Received notification from CoverMyMeds that prior authorization for Ozempic  is required/requested.   Insurance verification completed.   The patient is insured through [employer] .   Per test claim: PA required; PA submitted to above mentioned insurance via CoverMyMeds Key/confirmation #/EOC [id number] Status is pending

## 2023-08-12 NOTE — Telephone Encounter (Signed)
 Patient aware and verbalizes understanding.

## 2023-08-12 NOTE — Telephone Encounter (Signed)
 Pharmacy Patient Advocate Encounter  Received notification from [employer] that Prior Authorization for Ozempic  (2 MG/DOSE) 8MG /3ML pen-injectors has been APPROVED from 03/02/23 to 02/29/24. Ran test claim, Copay is $80. This test claim was processed through [facility]- copay amounts may vary at other pharmacies due to pharmacy/plan contracts, or as the patient moves through the different stages of their insurance plan.   PA #/Case ID/Reference #: [id number]

## 2023-08-12 NOTE — Telephone Encounter (Signed)
 PA request has been Approved. New Encounter has been or will be created for follow up. For additional info see Pharmacy Prior Auth telephone encounter from 08/12/23.

## 2023-08-16 MED ORDER — OZEMPIC (2 MG/DOSE) 8 MG/3ML ~~LOC~~ SOPN: 2.0000 mg | PEN_INJECTOR | SUBCUTANEOUS | 3 refills | Status: DC

## 2023-08-16 NOTE — Addendum Note (Signed)
 Addended by: Jonella Redditt D on: 08/16/2023 08:13 AM   Modules accepted: Orders

## 2023-08-16 NOTE — Telephone Encounter (Signed)
 Refill failed. resent

## 2023-08-24 ENCOUNTER — Other Ambulatory Visit: Payer: Self-pay | Admitting: Family Medicine

## 2023-09-15 LAB — HM DIABETES EYE EXAM

## 2023-11-30 ENCOUNTER — Other Ambulatory Visit: Payer: Self-pay | Admitting: Family Medicine

## 2023-11-30 DIAGNOSIS — G4701 Insomnia due to medical condition: Secondary | ICD-10-CM

## 2023-12-05 ENCOUNTER — Ambulatory Visit: Payer: Self-pay

## 2023-12-05 NOTE — Telephone Encounter (Signed)
 FYI Only or Action Required?: FYI only for provider.  Patient was last seen in primary care on 07/27/2023 by [name], MD.  Called Nurse Triage reporting Animal Bite.  Symptoms began 10-14 days ago.  Interventions attempted: Other: cleaned with peroxide and neosporin.  Symptoms are: right thumb dog bite with swelling and pain gradually worsening.  Triage Disposition: See Physician Within 24 Hours (overriding See HCP Within 4 Hours (Or PCP Triage))  Patient/caregiver understands and will follow disposition?: Yes                    Copied from CRM [id number]. Topic: Clinical - Red Word Triage >> Dec 05, 2023  9:32 AM Brian Roach wrote: Red Word that prompted transfer to Nurse Triage:   Concern: Dog bite in right thumb   Symptoms:  sores have healed   Thumb is stiff  Swollen   Hurts when it bends  Stinging and burning sensation occurs regularly   When did the symptoms start?: 10-14 days ago   What have you done to aid in the concern ? Have you taken anything to assist with the matter?: No   If so, what did you take?: Patient is on blood thinners so he opted out of taking any pain medication (can take tylenol  but hasn't)    Wanted to let you know I will be transferring you to further discuss your concern. Please be advised the nurse can assist with scheduling. Reason for Disposition  [1] Puncture wound or small cut AND [2] on hands or genitals  (Exception: Puncture  from small pet such as gerbil, mouse, hamster, puppy or turtle.)  Answer Assessment - Initial Assessment Questions 1. APPEARANCE What does it look like?  (e.g., abrasion, bruise, puncture)      Swollen, painful, tight. He states it started stinging and burning when he tries to bend it.  2. SIZE: How big is the bite? (e.g., inches, cm; or compare to size of coin, pea, grape, ping pong ball)      He states the sore have already healed up, 1 small puncture wound in the knuckle of his  thumb and 2 scrapes.  3. LOCATION: Where is the bite located?      Right thumb.  4. ONSET: When did the bite happen? (e.g., minutes, hours ago)      10-14 days ago.  5. ANIMAL: What type of animal caused the bite? Is the injury from a bite or a claw? If the animal is a dog or a cat, ask: Was it a pet or a stray? Was it acting ill or behaving strangely?     Dog bite. Pet. Was not acting ill or behaving strangely.  6. RABIES VACCINE: For dog or cat bites, ask: Do you know if the pet is vaccinated against rabies?  (e.g., yes, no, overdue for rabies shot, unknown)     Yes.  7. CIRCUMSTANCES: Tell me how this happened.      He states he was trying to take his dog out and put his leash on him and the dog snapped at him and bit his hand.  8. TETANUS: When was your last tetanus booster?     2019.  9. PREGNANCY: Is there any chance you are pregnant? When was your last menstrual period?     N/A.  Protocols used: Animal Bite-A-AH

## 2023-12-05 NOTE — Telephone Encounter (Signed)
 Appt made.

## 2023-12-06 ENCOUNTER — Ambulatory Visit: Admitting: Family Medicine

## 2023-12-06 ENCOUNTER — Encounter: Payer: Self-pay | Admitting: Family Medicine

## 2023-12-06 VITALS — BP 118/71 | HR 62 | Temp 98.2°F | Ht 70.0 in | Wt 182.0 lb

## 2023-12-06 DIAGNOSIS — S61031A Puncture wound without foreign body of right thumb without damage to nail, initial encounter: Secondary | ICD-10-CM

## 2023-12-06 DIAGNOSIS — W540XXA Bitten by dog, initial encounter: Secondary | ICD-10-CM

## 2023-12-06 MED ORDER — AMOXICILLIN-POT CLAVULANATE 875-125 MG PO TABS: 1.0000 | ORAL_TABLET | Freq: Two times a day (BID) | ORAL | 0 refills | Status: DC

## 2023-12-06 MED ORDER — PREDNISONE 20 MG PO TABS: 20.0000 mg | ORAL_TABLET | Freq: Two times a day (BID) | ORAL | 0 refills | Status: DC

## 2023-12-06 NOTE — Progress Notes (Signed)
 Chief Complaint  Patient presents with   Animal Bite    Right thumb swollen    HPI  Patient presents today for Discussed the use of AI scribe software for clinical note transcription with the patient, who gave verbal consent to proceed.  History of Present Illness Brian Roach is a 63 year old male who presents with a swollen and painful thumb following a dog bite. The dog is his own and it is UTD on shots.   He reports swelling and some redness around the knuckle of his thumb, persisting for approximately two weeks after being bitten by his own dog, which is up to date on rabies vaccinations.  The pain is described as stinging and burning, particularly when bending the thumb at the interphalangeal (IP) joint. He is unable to fully bend the thumb to touch the palm, with pain primarily at the IP joint, while the metacarpophalangeal (MCP) joint remains unaffected.  He has been managing the wound by keeping it clean with peroxide, applying aspirin , and soaking it in hot water and salt. Despite these measures, the swelling persists and increases after removing the thumb from the soak.  He has not taken any antibiotics since the incident occurred.     PMH: Smoking status noted Review of Systems  Objective: BP 118/71   Pulse 62   Temp 98.2 F (36.8 C)   Ht 5' 10 (1.778 m)   Wt 182 lb (82.6 kg)   SpO2 97%   BMI 26.11 kg/m  Physical Exam MEASUREMENTS: Weight- 180. GENERAL: Alert, cooperative, well developed, no acute distress. HEENT: Normocephalic, normal oropharynx, moist mucous membranes. CHEST: Clear to auscultation bilaterally, no wheezes, rhonchi, or crackles. CARDIOVASCULAR: Normal heart rate and rhythm, S1 and S2 normal without murmurs. ABDOMEN: Soft, non-tender, non-distended, without organomegaly, normal bowel sounds. EXTREMITIES: No cyanosis or edema. MUSCULOSKELETAL: Right thumb swollen with minimal redness around the IP joint. ROM diminished at IP.Right  thumb MCP joint normal. NEUROLOGICAL: Cranial nerves grossly intact, moves all extremities without gross motor or sensory deficit.    Dog bite, initial encounter  Other orders -     predniSONE ; Take 1 tablet (20 mg total) by mouth 2 (two) times daily with a meal.  Dispense: 10 tablet; Refill: 0 -     Amoxicillin -Pot Clavulanate; Take 1 tablet by mouth 2 (two) times daily. Take all of this medication  Dispense: 20 tablet; Refill: 0    Assessment and Plan Assessment & Plan Infection of right thumb following dog bite with cellulitis   Swelling and minimal erythema are present around the IP joint of the right thumb, following a dog bite two weeks ago. The dog's rabies vaccinations are current. The wound shows signs of infection, including stinging and burning sensations, consistent with cellulitis. No systemic symptoms are reported. He has been using peroxide, aspirin , and hot water soaks for management. Prescribe antibiotics for 10 days to address the infection and prednisone  for 5 days to reduce inflammation. Advise continuation of hot water soaks, ensuring not to burn the skin. Instruct on thumb exercises to maintain mobility, such as touching the thumb to the palm and making a fist with the thumb inside. Recommend using a stress ball to improve thumb strength and prevent stiffness.      Butler Der, MD

## 2024-01-30 ENCOUNTER — Ambulatory Visit: Payer: Self-pay | Admitting: Family Medicine

## 2024-01-30 ENCOUNTER — Encounter: Payer: Self-pay | Admitting: Family Medicine

## 2024-01-30 VITALS — BP 94/63 | HR 67 | Temp 98.1°F | Ht 70.0 in | Wt 181.0 lb

## 2024-01-30 DIAGNOSIS — Z23 Encounter for immunization: Secondary | ICD-10-CM | POA: Diagnosis not present

## 2024-01-30 DIAGNOSIS — I10 Essential (primary) hypertension: Secondary | ICD-10-CM

## 2024-01-30 DIAGNOSIS — Z7985 Long-term (current) use of injectable non-insulin antidiabetic drugs: Secondary | ICD-10-CM

## 2024-01-30 DIAGNOSIS — I471 Supraventricular tachycardia, unspecified: Secondary | ICD-10-CM

## 2024-01-30 DIAGNOSIS — Z7901 Long term (current) use of anticoagulants: Secondary | ICD-10-CM

## 2024-01-30 DIAGNOSIS — E756 Lipid storage disorder, unspecified: Secondary | ICD-10-CM

## 2024-01-30 DIAGNOSIS — Z86711 Personal history of pulmonary embolism: Secondary | ICD-10-CM

## 2024-01-30 DIAGNOSIS — K580 Irritable bowel syndrome with diarrhea: Secondary | ICD-10-CM

## 2024-01-30 DIAGNOSIS — G4701 Insomnia due to medical condition: Secondary | ICD-10-CM

## 2024-01-30 DIAGNOSIS — E1165 Type 2 diabetes mellitus with hyperglycemia: Secondary | ICD-10-CM

## 2024-01-30 DIAGNOSIS — E7800 Pure hypercholesterolemia, unspecified: Secondary | ICD-10-CM

## 2024-01-30 DIAGNOSIS — E1169 Type 2 diabetes mellitus with other specified complication: Secondary | ICD-10-CM

## 2024-01-30 LAB — COMPREHENSIVE METABOLIC PANEL WITH GFR
ALT: 15 IU/L (ref 0–44)
AST: 20 IU/L (ref 0–40)
Albumin: 4.2 g/dL (ref 3.9–4.9)
Alkaline Phosphatase: 73 IU/L (ref 47–123)
BUN/Creatinine Ratio: 11 (ref 10–24)
BUN: 11 mg/dL (ref 8–27)
Bilirubin Total: 0.6 mg/dL (ref 0.0–1.2)
CO2: 25 mmol/L (ref 20–29)
Calcium: 9.2 mg/dL (ref 8.6–10.2)
Chloride: 102 mmol/L (ref 96–106)
Creatinine, Ser: 0.96 mg/dL (ref 0.76–1.27)
Globulin, Total: 2 g/dL (ref 1.5–4.5)
Glucose: 96 mg/dL (ref 70–99)
Potassium: 4.4 mmol/L (ref 3.5–5.2)
Sodium: 140 mmol/L (ref 134–144)
Total Protein: 6.2 g/dL (ref 6.0–8.5)
eGFR: 88 mL/min/1.73 (ref >59)

## 2024-01-30 LAB — CBC WITH DIFFERENTIAL/PLATELET
Basophils Absolute: 0.1 x10E3/uL (ref 0.0–0.2)
Basos: 1 %
EOS (ABSOLUTE): 1.7 x10E3/uL — ABNORMAL HIGH (ref 0.0–0.4)
Eos: 25 %
Hematocrit: 47.1 % (ref 37.5–51.0)
Hemoglobin: 14.9 g/dL (ref 13.0–17.7)
Immature Grans (Abs): 0 x10E3/uL (ref 0.0–0.1)
Immature Granulocytes: 0 %
Lymphocytes Absolute: 2 x10E3/uL (ref 0.7–3.1)
Lymphs: 30 %
MCH: 31.3 pg (ref 26.6–33.0)
MCHC: 31.6 g/dL (ref 31.5–35.7)
MCV: 99 fL — ABNORMAL HIGH (ref 79–97)
Monocytes Absolute: 0.6 x10E3/uL (ref 0.1–0.9)
Monocytes: 9 %
Neutrophils Absolute: 2.5 x10E3/uL (ref 1.4–7.0)
Neutrophils: 35 %
Platelets: 189 x10E3/uL (ref 150–450)
RBC: 4.76 x10E6/uL (ref 4.14–5.80)
RDW: 12.6 % (ref 11.6–15.4)
WBC: 6.8 x10E3/uL (ref 3.4–10.8)

## 2024-01-30 LAB — LIPID PANEL
Chol/HDL Ratio: 1.9 ratio (ref 0.0–5.0)
Cholesterol, Total: 102 mg/dL (ref 100–199)
HDL: 53 mg/dL (ref >39)
LDL Chol Calc (NIH): 38 mg/dL (ref 0–99)
Triglycerides: 43 mg/dL (ref 0–149)
VLDL Cholesterol Cal: 11 mg/dL (ref 5–40)

## 2024-01-30 LAB — BAYER DCA HB A1C WAIVED: HB A1C (BAYER DCA - WAIVED): 5 % (ref 4.8–5.6)

## 2024-01-30 MED ORDER — OZEMPIC (2 MG/DOSE) 8 MG/3ML ~~LOC~~ SOPN: 2.0000 mg | PEN_INJECTOR | SUBCUTANEOUS | 3 refills | Status: AC

## 2024-01-30 MED ORDER — LINACLOTIDE 290 MCG PO CAPS: ORAL_CAPSULE | ORAL | 3 refills | Status: AC

## 2024-01-30 MED ORDER — ATORVASTATIN CALCIUM 20 MG PO TABS: ORAL_TABLET | ORAL | 3 refills | Status: DC

## 2024-01-30 MED ORDER — RIVAROXABAN 20 MG PO TABS: 20.0000 mg | ORAL_TABLET | Freq: Every day | ORAL | 3 refills | Status: AC

## 2024-01-30 MED ORDER — CETIRIZINE HCL 10 MG PO TABS: 10.0000 mg | ORAL_TABLET | Freq: Every day | ORAL | 1 refills | Status: AC

## 2024-01-30 NOTE — Progress Notes (Signed)
 Subjective:  Patient ID: Brian Roach, male    DOB: February 03, 1961  Age: 63 y.o. MRN: [id number]  CC: Medical Management of Chronic Issues ( No concerns)   HPI  Discussed the use of AI scribe software for clinical note transcription with the patient, who gave verbal consent to proceed.  History of Present Illness Brian Roach is a 63 year old male with hypertension and diabetes who presents for medication management and follow-up.  He has been experiencing low blood pressure since reducing his benazepril  dosage. No weakness, dizziness, or fatigue. He is currently taking half of the prescribed benazepril  dose.  He is on Ozempic  (semaglutide ) 2 mg weekly for diabetes management, with an A1c of 5.0. No hypoglycemia and maintains his weight between 177 to 180 pounds. He feels energetic and active, with no low blood sugar or chest pain.  His current medications include atorvastatin  and Xarelto , which is due for renewal. He occasionally uses cyclobenzaprine  for muscle stiffness and takes Linzess  for diarrhea as needed. He also takes Zyrtec  for allergies but questions its necessity now that frost has reduced symptoms.  He monitors his weight weekly and is committed to maintaining it. He mentions difficulty providing a urine sample due to not drinking water before the visit, as he believed he couldn't drink before blood work.          12/06/2023   10:03 AM 07/27/2023    8:30 AM 04/25/2023   10:46 AM  Depression screen PHQ 2/9  Decreased Interest 0 0 0  Down, Depressed, Hopeless 0 0 0  PHQ - 2 Score 0 0 0  Altered sleeping  0 0  Tired, decreased energy  0 0  Change in appetite  0 0  Feeling bad or failure about yourself   0 0  Trouble concentrating  0 0  Moving slowly or fidgety/restless  0 0  Suicidal thoughts  0 0  PHQ-9 Score  0  0   Difficult doing work/chores  Not difficult at all Not difficult at all     Data saved with a previous flowsheet row definition     History Brian Roach has a past medical history of Diabetes mellitus without complication (HCC), HTN (hypertension), benign (05/27/2012), Hyperlipidemia, Hypertension, Left shoulder pain (05/27/2012), PE (pulmonary embolism), PONV (postoperative nausea and vomiting), and Quadriceps muscle rupture, left, initial encounter (09/23/2016).   He has a past surgical history that includes Back surgery; Knee surgery; Lung surgery; Anterior cruciate ligament repair (Right); Wisdom tooth extraction; Quadriceps tendon repair (Left, 09/23/2016); Colonoscopy with propofol  (N/A, 04/20/2021); and Cataract extraction (Right).   His family history includes Heart disease in his brother and father.He reports that he has never smoked. He has never used smokeless tobacco. He reports that he does not drink alcohol and does not use drugs.    ROS Review of Systems  Constitutional: Negative.   HENT: Negative.    Eyes:  Negative for visual disturbance.  Respiratory:  Negative for cough and shortness of breath.   Cardiovascular:  Negative for chest pain and leg swelling.  Gastrointestinal:  Negative for abdominal pain, diarrhea, nausea and vomiting.  Genitourinary:  Negative for difficulty urinating.  Musculoskeletal:  Negative for arthralgias and myalgias.  Skin:  Negative for rash.  Neurological:  Negative for headaches.  Psychiatric/Behavioral:  Negative for sleep disturbance.     Objective:  BP 94/63   Pulse 67   Temp 98.1 F (36.7 C)   Ht 5' 10 (1.778 m)   Wt  181 lb (82.1 kg)   SpO2 99%   BMI 25.97 kg/m   BP Readings from Last 3 Encounters:  01/30/24 94/63  12/06/23 118/71  07/27/23 102/63    Wt Readings from Last 3 Encounters:  01/30/24 181 lb (82.1 kg)  12/06/23 182 lb (82.6 kg)  07/27/23 179 lb (81.2 kg)     Physical Exam Vitals reviewed.  Constitutional:      Appearance: He is well-developed.  HENT:     Head: Normocephalic and atraumatic.     Right Ear: External ear normal.      Left Ear: External ear normal.     Mouth/Throat:     Pharynx: No oropharyngeal exudate or posterior oropharyngeal erythema.  Eyes:     Pupils: Pupils are equal, round, and reactive to light.  Cardiovascular:     Rate and Rhythm: Normal rate and regular rhythm.     Heart sounds: No murmur heard. Pulmonary:     Effort: No respiratory distress.     Breath sounds: Normal breath sounds.  Musculoskeletal:     Cervical back: Normal range of motion and neck supple.  Neurological:     Mental Status: He is alert and oriented to person, place, and time.    Physical Exam MEASUREMENTS: Weight- 177-180. GENERAL: Alert, cooperative, well developed, no acute distress HEENT: Normocephalic, normal oropharynx, moist mucous membranes CHEST: Clear to auscultation bilaterally, No wheezes, rhonchi, or crackles CARDIOVASCULAR: Normal heart rate and rhythm, S1 and S2 normal without murmurs, Heart sounds normal. ABDOMEN: Soft, non-tender, non-distended, without organomegaly, Normal bowel sounds EXTREMITIES: No cyanosis or edema NEUROLOGICAL: Cranial nerves grossly intact, Moves all extremities without gross motor or sensory deficit   Assessment & Plan:  Diabetic lipidosis (HCC) -     Bayer DCA Hb A1c Waived -     Microalbumin / creatinine urine ratio -     Comprehensive metabolic panel with GFR  Insomnia due to medical condition -     CBC with Differential/Platelet  Hypercholesteremia -     Comprehensive metabolic panel with GFR -     Lipid panel -     Atorvastatin  Calcium ; TAKE 1 TABLET BY MOUTH EVERY DAY IN THE EVENING  Dispense: 90 tablet; Refill: 3  HTN (hypertension), benign -     CBC with Differential/Platelet  Paroxysmal SVT (supraventricular tachycardia) -     Comprehensive metabolic panel with GFR  Irritable bowel syndrome with diarrhea -     linaCLOtide ; TAKE 1 CAPSULE (290 MCG TOTAL) BY MOUTH DAILY. TO REGULATE BOWEL MOVEMENTS  Dispense: 90 capsule; Refill: 3  Uncontrolled type 2  diabetes mellitus with hyperglycemia (HCC) -     Atorvastatin  Calcium ; TAKE 1 TABLET BY MOUTH EVERY DAY IN THE EVENING  Dispense: 90 tablet; Refill: 3  Encounter for immunization -     Flu vaccine HIGH DOSE PF(Fluzone Trivalent)  Long term current use of anticoagulant  History of pulmonary embolus (PE)  Other orders -     Ozempic  (2 MG/DOSE); Inject 2 mg into the skin once a week.  Dispense: 9 mL; Refill: 3 -     Cetirizine  HCl; Take 1 tablet (10 mg total) by mouth daily. For allergy symptoms  Dispense: 90 tablet; Refill: 1 -     Rivaroxaban ; Take 1 tablet (20 mg total) by mouth daily with supper.  Dispense: 90 tablet; Refill: 3    Assessment and Plan Assessment & Plan Type 2 diabetes mellitus   His diabetes is well-controlled with an A1c of 5.0 and  no hypoglycemia episodes. Weight is stable and within the target range. Continue semaglutide  (Ozempic ) weekly and monitor blood glucose levels regularly.  Essential hypertension   Blood pressure is low, likely due to reduced benazepril  dosage. He reports no symptoms of weakness, dizziness, or fatigue. Benazepril  is discontinued due to low blood pressure and good weight control. Monitor blood pressure regularly.  Pure hypercholesterolemia   Cholesterol levels will be checked with recent blood work. Renew atorvastatin  prescription.  Irritable bowel syndrome with diarrhea   Intermittent diarrhea is managed with Linzess  as needed. Continue Linzess  as needed for diarrhea.  Allergic rhinitis   Allergy symptoms are managed with Zyrtec . Consider discontinuing Zyrtec  due to reduced symptoms following recent frost. Continue Zyrtec  as needed for allergy symptoms.  General Health Maintenance   Discussed medication management and potential cost-saving options for semaglutide  and Linzess , including mail order pharmacy. Renew Xarelto  prescription. Consider mail order pharmacy for semaglutide  and Linzess  if cost is a concern.       Follow-up:  Return in about 6 months (around 07/30/2024) for diabetes, Compete physical.  Butler Der, M.D.

## 2024-01-31 LAB — MICROALBUMIN / CREATININE URINE RATIO
Creatinine, Urine: 257.5 mg/dL
Microalb/Creat Ratio: 4 mg/g{creat} (ref 0–29)
Microalbumin, Urine: 11.2 ug/mL

## 2024-01-31 NOTE — Progress Notes (Signed)
 Hello [place],  Your lab result is normal and/or stable.Some minor variations that are not significant are commonly marked abnormal, but do not represent any medical problem for you.  Best regards, [name], M.D.

## 2024-02-02 ENCOUNTER — Other Ambulatory Visit (HOSPITAL_COMMUNITY): Payer: Self-pay

## 2024-02-03 ENCOUNTER — Other Ambulatory Visit (HOSPITAL_COMMUNITY): Payer: Self-pay

## 2024-02-24 ENCOUNTER — Other Ambulatory Visit: Payer: Self-pay | Admitting: Family Medicine

## 2024-02-24 DIAGNOSIS — E7800 Pure hypercholesterolemia, unspecified: Secondary | ICD-10-CM

## 2024-02-24 DIAGNOSIS — E1165 Type 2 diabetes mellitus with hyperglycemia: Secondary | ICD-10-CM

## 2024-02-25 ENCOUNTER — Other Ambulatory Visit: Payer: Self-pay | Admitting: Family Medicine

## 2024-02-25 DIAGNOSIS — G4701 Insomnia due to medical condition: Secondary | ICD-10-CM

## 2024-02-27 ENCOUNTER — Other Ambulatory Visit: Payer: Self-pay

## 2024-02-27 ENCOUNTER — Encounter: Payer: Self-pay | Admitting: Family Medicine

## 2024-02-27 DIAGNOSIS — G4701 Insomnia due to medical condition: Secondary | ICD-10-CM

## 2024-02-27 MED ORDER — TRAZODONE HCL 300 MG PO TABS: ORAL_TABLET | ORAL | 0 refills | Status: AC

## 2024-04-17 ENCOUNTER — Encounter: Admitting: Family Medicine

## 2024-07-30 ENCOUNTER — Encounter: Admitting: Family Medicine
